# Patient Record
Sex: Female | Born: 1955
Health system: Southern US, Community
[De-identification: ages and names within clinical notes are randomized; demographics above are authoritative.]

## PROBLEM LIST (undated history)

## (undated) DIAGNOSIS — I712 Thoracic aortic aneurysm, without rupture, unspecified: Secondary | ICD-10-CM

## (undated) DIAGNOSIS — E039 Hypothyroidism, unspecified: Secondary | ICD-10-CM

## (undated) DIAGNOSIS — I350 Nonrheumatic aortic (valve) stenosis: Secondary | ICD-10-CM

## (undated) DIAGNOSIS — F329 Major depressive disorder, single episode, unspecified: Secondary | ICD-10-CM

## (undated) DIAGNOSIS — F419 Anxiety disorder, unspecified: Secondary | ICD-10-CM

## (undated) DIAGNOSIS — E079 Disorder of thyroid, unspecified: Secondary | ICD-10-CM

## (undated) DIAGNOSIS — F32A Depression, unspecified: Secondary | ICD-10-CM

## (undated) DIAGNOSIS — I10 Essential (primary) hypertension: Secondary | ICD-10-CM

## (undated) DIAGNOSIS — F99 Mental disorder, not otherwise specified: Secondary | ICD-10-CM

## (undated) DIAGNOSIS — J45909 Unspecified asthma, uncomplicated: Secondary | ICD-10-CM

## (undated) DIAGNOSIS — M109 Gout, unspecified: Secondary | ICD-10-CM

## (undated) HISTORY — DX: Nonrheumatic aortic (valve) stenosis: I35.0

## (undated) HISTORY — PX: ABDOMINAL HYSTERECTOMY: SHX81

## (undated) HISTORY — PX: APPENDECTOMY: SHX54

## (undated) HISTORY — DX: Thoracic aortic aneurysm, without rupture, unspecified: I71.20

---

## 1999-02-16 ENCOUNTER — Other Ambulatory Visit: Admission: RE | Admit: 1999-02-16 | Discharge: 1999-02-16 | Payer: Self-pay | Admitting: Gynecology

## 1999-05-02 ENCOUNTER — Ambulatory Visit (HOSPITAL_COMMUNITY): Admission: RE | Admit: 1999-05-02 | Discharge: 1999-05-02 | Payer: Self-pay | Admitting: Obstetrics and Gynecology

## 1999-10-18 ENCOUNTER — Inpatient Hospital Stay (HOSPITAL_COMMUNITY): Admission: RE | Admit: 1999-10-18 | Discharge: 1999-10-20 | Payer: Self-pay | Admitting: Obstetrics and Gynecology

## 2001-11-25 ENCOUNTER — Other Ambulatory Visit: Admission: RE | Admit: 2001-11-25 | Discharge: 2001-11-25 | Payer: Self-pay | Admitting: Obstetrics and Gynecology

## 2003-11-29 ENCOUNTER — Emergency Department (HOSPITAL_COMMUNITY): Admission: EM | Admit: 2003-11-29 | Discharge: 2003-11-29 | Payer: Self-pay | Admitting: Emergency Medicine

## 2010-09-16 ENCOUNTER — Encounter: Payer: Self-pay | Admitting: Family Medicine

## 2010-12-19 ENCOUNTER — Emergency Department (HOSPITAL_COMMUNITY)
Admission: EM | Admit: 2010-12-19 | Discharge: 2010-12-20 | Disposition: A | Discharge status: Other Acute Inpatient Hospital | Payer: Self-pay | Attending: Emergency Medicine | Admitting: Emergency Medicine

## 2010-12-19 ENCOUNTER — Emergency Department (HOSPITAL_COMMUNITY): Payer: Self-pay

## 2010-12-19 DIAGNOSIS — S0003XA Contusion of scalp, initial encounter: Secondary | ICD-10-CM | POA: Insufficient documentation

## 2010-12-19 DIAGNOSIS — F1010 Alcohol abuse, uncomplicated: Secondary | ICD-10-CM | POA: Insufficient documentation

## 2010-12-19 DIAGNOSIS — T50902A Poisoning by unspecified drugs, medicaments and biological substances, intentional self-harm, initial encounter: Secondary | ICD-10-CM | POA: Insufficient documentation

## 2010-12-19 DIAGNOSIS — S0083XA Contusion of other part of head, initial encounter: Secondary | ICD-10-CM | POA: Insufficient documentation

## 2010-12-19 DIAGNOSIS — R45851 Suicidal ideations: Secondary | ICD-10-CM | POA: Insufficient documentation

## 2010-12-19 DIAGNOSIS — W19XXXA Unspecified fall, initial encounter: Secondary | ICD-10-CM | POA: Insufficient documentation

## 2010-12-19 DIAGNOSIS — T50901A Poisoning by unspecified drugs, medicaments and biological substances, accidental (unintentional), initial encounter: Secondary | ICD-10-CM | POA: Insufficient documentation

## 2010-12-19 LAB — DIFFERENTIAL
Basophils Absolute: 0 10*3/uL (ref 0.0–0.1)
Lymphocytes Relative: 46 % (ref 12–46)
Lymphs Abs: 2.7 10*3/uL (ref 0.7–4.0)
Monocytes Absolute: 0.4 10*3/uL (ref 0.1–1.0)
Monocytes Relative: 7 % (ref 3–12)
Neutro Abs: 2.6 10*3/uL (ref 1.7–7.7)

## 2010-12-19 LAB — RAPID URINE DRUG SCREEN, HOSP PERFORMED: Cocaine: NOT DETECTED

## 2010-12-19 LAB — CBC
HCT: 43.9 % (ref 36.0–46.0)
Hemoglobin: 15.1 g/dL — ABNORMAL HIGH (ref 12.0–15.0)
MCH: 31.3 pg (ref 26.0–34.0)
MCHC: 34.4 g/dL (ref 30.0–36.0)
MCV: 90.9 fL (ref 78.0–100.0)

## 2010-12-19 LAB — COMPREHENSIVE METABOLIC PANEL
AST: 45 U/L — ABNORMAL HIGH (ref 0–37)
CO2: 24 mEq/L (ref 19–32)
Chloride: 108 mEq/L (ref 96–112)
Creatinine, Ser: 0.65 mg/dL (ref 0.4–1.2)
GFR calc Af Amer: >60 mL/min (ref >60)
GFR calc non Af Amer: >60 mL/min (ref >60)
Total Bilirubin: 0.6 mg/dL (ref 0.3–1.2)

## 2010-12-20 ENCOUNTER — Encounter (HOSPITAL_COMMUNITY): Payer: Self-pay

## 2010-12-20 ENCOUNTER — Inpatient Hospital Stay (HOSPITAL_COMMUNITY)
Admission: EM | Admit: 2010-12-20 | Discharge: 2010-12-25 | DRG: 885 | Disposition: A | Discharge status: Home / Self Care | Payer: Self-pay | Source: Intra-hospital | Attending: Psychiatry | Admitting: Psychiatry

## 2010-12-20 DIAGNOSIS — F411 Generalized anxiety disorder: Secondary | ICD-10-CM

## 2010-12-20 DIAGNOSIS — T424X4A Poisoning by benzodiazepines, undetermined, initial encounter: Secondary | ICD-10-CM

## 2010-12-20 DIAGNOSIS — F17200 Nicotine dependence, unspecified, uncomplicated: Secondary | ICD-10-CM

## 2010-12-20 DIAGNOSIS — T510X4A Toxic effect of ethanol, undetermined, initial encounter: Secondary | ICD-10-CM

## 2010-12-20 DIAGNOSIS — Z7982 Long term (current) use of aspirin: Secondary | ICD-10-CM

## 2010-12-20 DIAGNOSIS — T43502A Poisoning by unspecified antipsychotics and neuroleptics, intentional self-harm, initial encounter: Secondary | ICD-10-CM

## 2010-12-20 DIAGNOSIS — T438X2A Poisoning by other psychotropic drugs, intentional self-harm, initial encounter: Secondary | ICD-10-CM

## 2010-12-20 DIAGNOSIS — I10 Essential (primary) hypertension: Secondary | ICD-10-CM

## 2010-12-20 DIAGNOSIS — T6592XA Toxic effect of unspecified substance, intentional self-harm, initial encounter: Secondary | ICD-10-CM

## 2010-12-20 DIAGNOSIS — E039 Hypothyroidism, unspecified: Secondary | ICD-10-CM

## 2010-12-20 DIAGNOSIS — F332 Major depressive disorder, recurrent severe without psychotic features: Principal | ICD-10-CM

## 2010-12-20 LAB — SALICYLATE LEVEL: Salicylate Lvl: <4 mg/dL (ref 2.8–20.0)

## 2010-12-20 LAB — ACETAMINOPHEN LEVEL: Acetaminophen (Tylenol), Serum: <10 ug/mL — ABNORMAL LOW (ref 10–30)

## 2010-12-21 DIAGNOSIS — F1010 Alcohol abuse, uncomplicated: Secondary | ICD-10-CM

## 2010-12-21 NOTE — Consult Note (Signed)
  Anita, [name]:  [id number]  MEDICAL RECORD NO.:  [id number]           PATIENT TYPE:  E  LOCATION:  WLED                         FACILITY:  [facility]  PHYSICIAN:  Eulogio Ditch, MD DATE OF BIRTH:  1956-02-02  DATE OF CONSULTATION:  12/20/2010 DATE OF DISCHARGE:                                CONSULTATION   REASON FOR CONSULTATION:  Overdose.  HISTORY OF PRESENT ILLNESS:  This 55 year old white female with history of depression was brought to the ED after the intention overdose.  The patient overdosed on 40 to 50 Klonopin.  The patient reported stress at every level.  She has lost the job, has conflict with the daughter, has financial issues.  The patient is logical and goal directed during the interview.  Denies hearing any voices, is not delusional.  The patient is also abusing alcohol, mainly the beer.  The patient is currently not on any psych medication.  MEDICAL ISSUE:  History of hypothyroidism.  ALLERGIES:  No known drug allergies.  PHYSICAL EXAMINATION:  Done in the ER, within normal limits. VITAL SIGNS:  Blood pressure 127/80, pulse 84, respiratory rate 19.  LABORATORY DATA:  WBC 5.9, hemoglobin 15.1, alcohol level 161.  Sodium 142, potassium 4.0, BUN 11, creatinine 0.65, AST 45, ALT 55.  UDS positive for benzo.  Acetaminophen level less than 10.  Salicylate less than 4.  CT head, no acute intracranial abnormality.  Physical examination within normal limits, done by Dr. Valma Cava.  MENTAL STATUS EXAMINATION:  The patient is calm, cooperative during the interview.  Fair eye contact.  No abnormal movements present.  Hygiene, grooming fair.  Speech normal in rate, rhythm and volume.  Mood depressed, affect mood congruent.  Thought process logical and goal directed.  Thought content, recent suicide attempt by overdose, still has suicidal ideations.  Thought perception, no audiovisual hallucination reported, not internally  preoccupied.  Cognition; alert, awake, oriented x3.  Memory; immediate, recent, remote fair.  Attention and concentration poor.  Abstraction ability fair.  Insight and judgment poor.  DIAGNOSIS:  AXIS I:  Chronic alcohol abuse.  Depressive disorder, not otherwise specified, rule out major depressive disorder. AXIS II:  Deferred. AXIS III:  Hypothyroidism. AXIS IV:  Psychosocial stressors. AXIS V:  30 to 40.  RECOMMENDATIONS: 1. The patient will be admitted to [facility] for further     stabilization. 2. Librium 50 mg p.o. at bedtime started. 3. Rest of the treatment I will defer to the admitting doctor on the     unit.     Eulogio Ditch, MD     SA/MEDQ  D:  12/20/2010  T:  12/20/2010  Job:  [id number]  Electronically Signed by Eulogio Ditch  on 12/21/2010 06:50:04 AM

## 2010-12-21 NOTE — H&P (Signed)
NAME:  Anita Russell, COLLET NO.:  [id number]  MEDICAL RECORD NO.:  [id number]           PATIENT TYPE:  I  LOCATION:  0505                          [facility]ACILITY:  [facility]  PHYSICIAN:  Franchot Gallo, MD     DATE OF BIRTH:  1956-05-13  DATE OF ADMISSION:  12/20/2010 DATE OF DISCHARGE:                      PSYCHIATRIC ADMISSION ASSESSMENT   CHIEF COMPLAINT:  "I am depressed and tried to kill myself."  HISTORY OF PRESENT ILLNESS:  Anita Russell is a 55 year old single white female who presents to [facility] after overdosing on a combination of Klonopin and alcohol.  The patient states that she has been under a great deal of stress for many years with everything "coming to a head Wednesday."  She reports that she went to work and had an altercation with her supervisor and began to "yell and curse at her." The patient states that her supervisor sent her home and at that point she overdosed on what she thought was Zoloft and alcohol.  However, it appears the patient took a combination of Klonopin and beer.  The patient states that she is having significant difficulty initiating and maintaining sleep as well as decreased appetite, crying spells, feelings of sadness, anhedonia and moderate-to-severe depression.  She denies any past suicide attempts or gestures and is unclear if she was attempting to kill herself at the time of this overdose.  The patient states that in addition to work-related stresses, she has stress related to finances as well as a recent conflict with her daughter.  The patient denies any past or current manic or hypomanic symptoms nor does she report any auditory or visual infections or delusional thinking.  She states that she does drink alcohol occasionally but does not have an issue with over using alcohol or with illicit drugs.  She presents for evaluation and treatment of the above symptoms.  PAST PSYCHIATRIC HISTORY:  The patient denies any  past psychiatric hospitalizations.  She does report being on "4 to 5" different antidepressants since her daughter was 12 years of age, which would have been 14 years ago.  She reports, however, that no medication helped her depression with the exception of Zoloft.  She also reports being on Xanax for a time and Klonopin for anxiety.  PAST MEDICAL HISTORY:  CURRENT MEDICATIONS: 1. Klonopin 1 mg p.o. b.i.d. 2. Metoprolol 50 mg p.o. b.i.d. 3. Levothyroxine 100 mcg p.o. q.a.m. 4. Enteric-coated aspirin 325 mg p.o. every other day. 5. Albuterol inhaler 1/2 puff q.4 h. as needed for breathing     difficulty. 6. Multivitamin p.o. q.a.m.  ALLERGIES:  NKDA.  MEDICAL ILLNESSES: 1. Hypothyroidism. 2. Hypertension. 3. Some breathing difficulties.  PAST OPERATIONS: 1. Hysterectomy. 2. Appendectomy.  FAMILY HISTORY:  The patient reports that her biological mother had a history of depression and also took the medication Zoloft.  She denies any other family history of psychiatric or substance abuse related illnesses.  SOCIAL HISTORY:  The patient states that she was born in [place] and has lived in [place] for 23 years.  She is single and has never been married and has 1 daughter 67 years of age.  She  currently lives alone.  The patient reports smoking 1 pack of cigarettes per day and has done so for many years.  She reports occasionally using alcohol but does not feel she has an issue with this.  She denies any use of illicit drugs.  MENTAL STATUS EXAM:  General:  The patient was alert and oriented x3. She initially was somewhat guarded but became more cooperative throughout the evaluation.  Speech was appropriate in rate and volume but appeared moderate to severely depressed.  Affect was tearful.  The patient denied any auditory or visual hallucinations or delusional thinking.  The patient denied any current suicidal ideations.  She denied any homicidal ideations.   Judgment and insight both appeared fair.  IMPRESSION:  AXIS I: 1. 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 2. Generalized anxiety disorder - currently under fair to poor     control. 3. Nicotine dependence. AXIS II:  Deferred. AXIS III: 1. Hypertension. 2. Hypothyroidism. 3. Some difficulty breathing. 4. Status post hysterectomy. 5. Status post appendectomy. AXIS IV: 1. Financial constraints. 2. Recent conflicts with daughter. 3. Work-related stresses. 4. Chronic mental illness. AXIS V:  Global assessment of functioning at the time of admission approximately 35.  Highest global assessment of functioning in the past year approximately 55.  PLAN: 1. The patient was restarted on the medication Zoloft at 50 mg p.o.     q.a.m. to address her depressive and anxiety symptoms.  This     medication was started since the patient states that it has helped     more than any other antidepressive in the past. 2. The patient was restarted on the medication Klonopin at 0.5 mg p.o.     q.a.m. and 2 p.m. and 1 mg at q.h.s. 3. The patient was continued on her metoprolol at 50 mg p.o. b.i.d. 4. The patient was restarted on her levothyroxine at 100 mcg p.o. q.a.m. 5. The patient was restarted on her medication enteric-coated aspirin     at 325 mg p.o. every other day. 6. The patient was continued on her medication albuterol inhaler at     1/2 puff q.4 h. as needed for breathing difficulty. 7. The patient was continued on her multivitamin p.o. q.a.m. 8. A TSH, T3 uptake and T4 were reordered to assess her current     thyroid status. 9. The patient will continue to be monitored for dangerousness to self     and/or others. 10.The patient will participate in unit activities per routine.    ___________________________________ Franchot Gallo, MD     RR/MEDQ  D:  12/21/2010  T:  12/21/2010  Job:  [id number]  Electronically Signed by Franchot Gallo MD on 12/21/2010 04:13:11 PM

## 2010-12-22 LAB — T4, FREE: Free T4: 1.07 ng/dL (ref 0.80–1.80)

## 2010-12-22 LAB — TSH: TSH: 0.951 u[IU]/mL (ref 0.350–4.500)

## 2010-12-25 DIAGNOSIS — F339 Major depressive disorder, recurrent, unspecified: Secondary | ICD-10-CM

## 2010-12-25 DIAGNOSIS — F411 Generalized anxiety disorder: Secondary | ICD-10-CM

## 2010-12-31 NOTE — Discharge Summary (Signed)
NAMEJAMI, Anita [name]:  [id number]  MEDICAL RECORD NO.:  [id number]           PATIENT TYPE:  I  LOCATION:  0505                          [facility]ACILITY:  [facility]  PHYSICIAN:  Franchot Gallo, MD     DATE OF BIRTH:  February 13, 1956  DATE OF ADMISSION:  12/20/2010 DATE OF DISCHARGE:  12/25/2010                              DISCHARGE SUMMARY   REASON FOR ADMISSION:  The patient was depressed and tried to kill herself.  This was a 55 year old single female that presented after overdosing on a combination of Klonopin and alcohol.  She was under a lot of stress for many years with everything coming to a head on Wednesday, had an altercation with her supervisor, began to yell and curse.  She was sent home and at this point she overdosed on what she thought was Zoloft and alcohol.  FINAL IMPRESSION:  Axis I:  Major depressive disorder, recurrent, generalized anxiety disorder. Axis II:  Deferred. Axis III:  Hypertension, hypothyroidism, status post hysterectomy and appendectomy. Axis IV:  Financial constraints, recent conflict with daughter, work related stressors, chronic mental illness.  Axis V:  Global Assessment of Functioning 55.  SIGNIFICANT LABORATORY DATA:  Her alcohol level was 161.  Glucose mildly elevated at 104.  Urine drug screen positive for benzodiazepines. Acetaminophen level less than 10.  SIGNIFICANT FINDINGS:  This was an alert and oriented female, somewhat guarded, but became more cooperative throughout the evaluation.  Her speech was appropriate.  Her affect was tearful.  She was feeling depressed.  She denied any psychotic symptoms.  Her judgment and insight were fair.  She was admitted to the adult milieu in the mood disorder group.  We restarted the patient on her medication Zoloft at 50 mg to address her depressive and anxiety symptoms.  The patient reported that this was more helpful than any other antidepressant that she has had before.  Also  restarted on her medication Klonopin at 0.5 b.i.d. and 1 mg nightly.  We continued her metoprolol, levothyroxine, her aspirin, inhaler and obtained a T3 and T4.  Her sleep had improved.  Her appetite was good.  Her depression was rated as moderate.  She was denying any suicidal or homicidal thoughts, reporting her anxiety was under better control and overall feeling better.  She was participating in group.  She was very happy with her progress, denied any auditory hallucinations.  She discussed her stressors openly with her daughter.  She reported that she knew that she needed medicine for some time, but was having some financial concerns. She started to have goals for herself about possibly getting a different job and working towards a better relationship with her daughter.  On the day of discharge, the patient's sleep was good, her appetite was good. She was having mild depressive symptoms.  Denied any suicidal or homicidal thoughts with the patient stating that "she will never try that again."  She denies any auditory hallucinations.  Her anxiety was in fair to good control.  She was requesting discharge and we felt the patient was stable for discharge.  DISCHARGE MEDICATIONS: 1. Zoloft 50 mg 1 daily. 2. Albuterol  inhaler as needed. 3. Aspirin enteric-coated daily. 4. Levothyroxine 100 mg daily. 5. Metoprolol 50 mg 1 b.i.d. 6. Multivitamin 1 daily. 7. Klonopin 0.5 mg taking 1 pill b.i.d. and 2 at bedtime.  FOLLOW-UP APPOINTMENT:  [facility] on Dec 28, 2010, at 10:30, phone was [phone].     Landry Corporal, N.P.   ______________________________ Franchot Gallo, MD    JO/MEDQ  D:  12/25/2010  T:  12/26/2010  Job:  [id number]  Electronically Signed by Limmie PatriciaP. on 12/31/2010 09:40:25 AM Electronically Signed by Franchot Gallo MD on 12/31/2010 04:46:24 PM

## 2011-04-09 ENCOUNTER — Emergency Department (HOSPITAL_COMMUNITY)
Admission: EM | Admit: 2011-04-09 | Discharge: 2011-04-10 | Disposition: A | Discharge status: Home / Self Care | Payer: Self-pay | Attending: Emergency Medicine | Admitting: Emergency Medicine

## 2011-04-09 DIAGNOSIS — M109 Gout, unspecified: Secondary | ICD-10-CM | POA: Insufficient documentation

## 2011-04-09 DIAGNOSIS — E0590 Thyrotoxicosis, unspecified without thyrotoxic crisis or storm: Secondary | ICD-10-CM | POA: Insufficient documentation

## 2011-04-09 DIAGNOSIS — M25569 Pain in unspecified knee: Secondary | ICD-10-CM | POA: Insufficient documentation

## 2011-04-28 ENCOUNTER — Emergency Department (HOSPITAL_COMMUNITY)
Admission: EM | Admit: 2011-04-28 | Discharge: 2011-04-28 | Disposition: A | Discharge status: Home / Self Care | Payer: Self-pay | Attending: Emergency Medicine | Admitting: Emergency Medicine

## 2011-04-28 DIAGNOSIS — E039 Hypothyroidism, unspecified: Secondary | ICD-10-CM | POA: Insufficient documentation

## 2011-04-28 DIAGNOSIS — I10 Essential (primary) hypertension: Secondary | ICD-10-CM | POA: Insufficient documentation

## 2011-04-28 DIAGNOSIS — M79609 Pain in unspecified limb: Secondary | ICD-10-CM | POA: Insufficient documentation

## 2011-04-28 DIAGNOSIS — M109 Gout, unspecified: Secondary | ICD-10-CM | POA: Insufficient documentation

## 2012-03-16 ENCOUNTER — Encounter (HOSPITAL_BASED_OUTPATIENT_CLINIC_OR_DEPARTMENT_OTHER): Payer: Self-pay | Admitting: *Deleted

## 2012-03-16 ENCOUNTER — Emergency Department (HOSPITAL_BASED_OUTPATIENT_CLINIC_OR_DEPARTMENT_OTHER)
Admission: EM | Admit: 2012-03-16 | Discharge: 2012-03-17 | Disposition: A | Discharge status: Home / Self Care | Payer: Self-pay | Attending: Emergency Medicine | Admitting: Emergency Medicine

## 2012-03-16 DIAGNOSIS — E079 Disorder of thyroid, unspecified: Secondary | ICD-10-CM | POA: Insufficient documentation

## 2012-03-16 DIAGNOSIS — I10 Essential (primary) hypertension: Secondary | ICD-10-CM | POA: Insufficient documentation

## 2012-03-16 DIAGNOSIS — Z90710 Acquired absence of both cervix and uterus: Secondary | ICD-10-CM | POA: Insufficient documentation

## 2012-03-16 DIAGNOSIS — M109 Gout, unspecified: Secondary | ICD-10-CM | POA: Insufficient documentation

## 2012-03-16 DIAGNOSIS — F17200 Nicotine dependence, unspecified, uncomplicated: Secondary | ICD-10-CM | POA: Insufficient documentation

## 2012-03-16 HISTORY — DX: Disorder of thyroid, unspecified: E07.9

## 2012-03-16 HISTORY — DX: Essential (primary) hypertension: I10

## 2012-03-16 HISTORY — DX: Gout, unspecified: M10.9

## 2012-03-16 MED ORDER — PREDNISONE 50 MG PO TABS
60.0000 mg | ORAL_TABLET | Freq: Once | ORAL | Status: AC
  Administered 2012-03-16: 60 mg via ORAL
  Filled 2012-03-16: qty 1

## 2012-03-16 MED ORDER — PREDNISONE 50 MG PO TABS: 50.0000 mg | ORAL_TABLET | Freq: Every day | ORAL | Status: DC

## 2012-03-16 MED ORDER — INDOMETHACIN 50 MG PO CAPS: 50.0000 mg | ORAL_CAPSULE | Freq: Three times a day (TID) | ORAL | Status: DC

## 2012-03-16 MED ORDER — OXYCODONE-ACETAMINOPHEN 5-325 MG PO TABS
1.0000 | ORAL_TABLET | Freq: Once | ORAL | Status: AC
  Administered 2012-03-16: 1 via ORAL
  Filled 2012-03-16: qty 1

## 2012-03-16 MED ORDER — INDOMETHACIN 25 MG PO CAPS
50.0000 mg | ORAL_CAPSULE | Freq: Once | ORAL | Status: AC
  Administered 2012-03-16: 50 mg via ORAL
  Filled 2012-03-16: qty 2

## 2012-03-16 MED ORDER — OXYCODONE-ACETAMINOPHEN 5-325 MG PO TABS: 1.0000 | ORAL_TABLET | ORAL | Status: AC | PRN

## 2012-03-16 NOTE — ED Provider Notes (Signed)
History   This chart was scribed for Anita Booze, MD by Anita Russell. The patient was seen in room MH03/MH03. Patient's care was started at 2259.     CSN: [id number]  Arrival date & time 03/16/12  2259   First MD Initiated Contact with Patient 03/16/12 2343      Chief Complaint  Patient presents with  . Foot Pain    (Consider location/radiation/quality/duration/timing/severity/associated sxs/prior treatment) Patient is a 56 y.o. female presenting with lower extremity pain. The history is provided by the patient. No language interpreter was used.  Foot Pain This is a new problem. The current episode started yesterday. The problem occurs constantly. The problem has not changed since onset.She has tried a cold compress for the symptoms. The treatment provided no relief.   Anita Russell is a 56 y.o. female who presents to the Emergency Department complaining of moderate to severe, constant right great toe pain onset 1 day ago. Patient has applied ice to her foot with no relief. Patient states that the cold just irritated it further. Patient denies any allergies to medication. Patient with medical h/o of gout. Patient also with h/o HTN, thyroid disease, appendectomy and abdominal hysterectomy. Patient is a current everyday smoker (1 pack/day). Patient was transported to the ED by EMS, but states that her neighbor will driver her home from the ED.   PCP - [facility], Anita Russell, [place]  Past Medical History  Diagnosis Date  . Gout   . Hypertension   . Thyroid disease     Past Surgical History  Procedure Date  . Appendectomy   . Abdominal hysterectomy     No family history on file.  History  Substance Use Topics  . Smoking status: Current Everyday Smoker -- 1.0 packs/day  . Smokeless tobacco: Not on file  . Alcohol Use: Yes    OB History    Grav Para Term Preterm Abortions TAB SAB Ect Mult Living                  Review of Systems  Musculoskeletal:   Positive for pain and swelling of right great toe.  All other systems reviewed and are negative.    Allergies  Review of patient's allergies indicates no known allergies.  Home Medications   Current Outpatient Rx  Name Route Sig Dispense Refill  . ALBUTEROL SULFATE HFA 108 (90 BASE) MCG/ACT IN AERS Inhalation Inhale 2 puffs into the lungs every 6 (six) hours as needed. For shortness of breath    . CLONAZEPAM 1 MG PO TABS Oral Take 1 mg by mouth 2 (two) times daily as needed. For anxiety    . LEVOTHYROXINE SODIUM 100 MCG PO TABS Oral Take 100 mcg by mouth daily.    Marland Kitchen METOPROLOL TARTRATE 50 MG PO TABS Oral Take 50 mg by mouth 2 (two) times daily.    . SERTRALINE HCL 50 MG PO TABS Oral Take 50 mg by mouth daily.      BP 170/100  Pulse 75  Temp 97.7 F (36.5 C) (Oral)  Resp 20  SpO2 100%  Physical Exam  Nursing note and vitals reviewed. Constitutional: She is oriented to person, place, and time. She appears well-developed and well-nourished.       Appears uncomfortable.  HENT:  Head: Normocephalic and atraumatic.  Eyes: Conjunctivae and EOM are normal. Pupils are equal, round, and reactive to light.  Neck: Normal range of motion. Neck supple.  Cardiovascular: Normal rate and regular rhythm.  Pulmonary/Chest: Effort normal and breath sounds normal.  Abdominal: Soft. Bowel sounds are normal.  Musculoskeletal: Normal range of motion.       Erythema, warmth and swelling to right first MTP joint.   Neurological: She is alert and oriented to person, place, and time.  Skin: Skin is warm and dry.  Psychiatric: She has a normal mood and affect.    ED Course  Procedures (including critical care time) DIAGNOSTIC STUDIES: Oxygen Saturation is 100% on room air, normal by my interpretation.    COORDINATION OF CARE: 11:43pm- Patient informed of current plan for treatment and evaluation and agrees with plan at this time. Will administer Percocet, Indocin and Prednisone tablets in the  ED. Will discharge prescriptions for the same. Recommended that patient get Percocet prescription filled as soon as possible.    1. Podagra       MDM  Acute attack of gout. Prior records are reviewed and she has a prior ED visit for gout. She will be treated with prednisone, indomethacin, and Percocet.      I personally performed the services described in this documentation, which was scribed in my presence. The recorded information has been reviewed and considered.      Anita Booze, MD 03/17/12 [phone]

## 2012-03-16 NOTE — ED Notes (Signed)
Right foot pain since yesterday. Hx of gout. To ED via EMS.

## 2012-03-16 NOTE — ED Notes (Signed)
Pt right great toe inflamed,red and painful to touch

## 2012-06-12 ENCOUNTER — Encounter (HOSPITAL_COMMUNITY): Payer: Self-pay | Admitting: Emergency Medicine

## 2012-06-12 ENCOUNTER — Emergency Department (HOSPITAL_COMMUNITY)
Admission: EM | Admit: 2012-06-12 | Discharge: 2012-06-13 | Disposition: A | Discharge status: Other Acute Inpatient Hospital | Payer: Self-pay | Attending: Emergency Medicine | Admitting: Emergency Medicine

## 2012-06-12 DIAGNOSIS — E860 Dehydration: Secondary | ICD-10-CM | POA: Insufficient documentation

## 2012-06-12 DIAGNOSIS — F341 Dysthymic disorder: Secondary | ICD-10-CM | POA: Insufficient documentation

## 2012-06-12 DIAGNOSIS — Z79899 Other long term (current) drug therapy: Secondary | ICD-10-CM | POA: Insufficient documentation

## 2012-06-12 DIAGNOSIS — Z9089 Acquired absence of other organs: Secondary | ICD-10-CM | POA: Insufficient documentation

## 2012-06-12 DIAGNOSIS — F10929 Alcohol use, unspecified with intoxication, unspecified: Secondary | ICD-10-CM

## 2012-06-12 DIAGNOSIS — F329 Major depressive disorder, single episode, unspecified: Secondary | ICD-10-CM

## 2012-06-12 DIAGNOSIS — E079 Disorder of thyroid, unspecified: Secondary | ICD-10-CM | POA: Insufficient documentation

## 2012-06-12 DIAGNOSIS — I10 Essential (primary) hypertension: Secondary | ICD-10-CM | POA: Insufficient documentation

## 2012-06-12 DIAGNOSIS — F1020 Alcohol dependence, uncomplicated: Secondary | ICD-10-CM | POA: Insufficient documentation

## 2012-06-12 LAB — POCT I-STAT, CHEM 8
Chloride: 105 mEq/L (ref 96–112)
HCT: 49 % — ABNORMAL HIGH (ref 36.0–46.0)
Potassium: 4 mEq/L (ref 3.5–5.1)
Sodium: 143 mEq/L (ref 135–145)

## 2012-06-12 LAB — RAPID URINE DRUG SCREEN, HOSP PERFORMED: Barbiturates: NOT DETECTED

## 2012-06-12 LAB — URINE MICROSCOPIC-ADD ON

## 2012-06-12 LAB — CBC WITH DIFFERENTIAL/PLATELET
Basophils Absolute: 0 10*3/uL (ref 0.0–0.1)
Basophils Relative: 0 % (ref 0–1)
HCT: 46.3 % — ABNORMAL HIGH (ref 36.0–46.0)
MCHC: 35.9 g/dL (ref 30.0–36.0)
Monocytes Relative: 4 % (ref 3–12)
Neutro Abs: 2.8 10*3/uL (ref 1.7–7.7)
Neutrophils Relative %: 48 % (ref 43–77)
Platelets: 190 10*3/uL (ref 150–400)
RBC: 5.21 MIL/uL — ABNORMAL HIGH (ref 3.87–5.11)
RDW: 12.4 % (ref 11.5–15.5)
WBC: 5.8 10*3/uL (ref 4.0–10.5)

## 2012-06-12 LAB — HEPATIC FUNCTION PANEL
ALT: 18 U/L (ref 0–35)
AST: 23 U/L (ref 0–37)
Albumin: 3.4 g/dL — ABNORMAL LOW (ref 3.5–5.2)
Total Protein: 7 g/dL (ref 6.0–8.3)

## 2012-06-12 LAB — ETHANOL: Alcohol, Ethyl (B): 263 mg/dL — ABNORMAL HIGH (ref 0–11)

## 2012-06-12 LAB — URINALYSIS, ROUTINE W REFLEX MICROSCOPIC
Bilirubin Urine: NEGATIVE
Ketones, ur: NEGATIVE mg/dL
Nitrite: NEGATIVE
Protein, ur: 30 mg/dL — AB
Specific Gravity, Urine: 1.024 (ref 1.005–1.030)
Urobilinogen, UA: 0.2 mg/dL (ref 0.0–1.0)

## 2012-06-12 LAB — LIPASE, BLOOD: Lipase: 18 U/L (ref 11–59)

## 2012-06-12 MED ORDER — IBUPROFEN 200 MG PO TABS: 600.0000 mg | ORAL_TABLET | Freq: Three times a day (TID) | ORAL | Status: DC | PRN

## 2012-06-12 MED ORDER — ONDANSETRON HCL 8 MG PO TABS
4.0000 mg | ORAL_TABLET | Freq: Three times a day (TID) | ORAL | Status: DC | PRN
  Administered 2012-06-13 (×2): 4 mg via ORAL
  Filled 2012-06-12 (×2): qty 1

## 2012-06-12 MED ORDER — VITAMIN B-1 100 MG PO TABS
100.0000 mg | ORAL_TABLET | Freq: Every day | ORAL | Status: DC
  Administered 2012-06-13: 100 mg via ORAL
  Filled 2012-06-12 (×2): qty 1

## 2012-06-12 MED ORDER — ALUM & MAG HYDROXIDE-SIMETH 200-200-20 MG/5ML PO SUSP
30.0000 mL | ORAL | Status: DC | PRN
  Administered 2012-06-13: 30 mL via ORAL
  Filled 2012-06-12: qty 30

## 2012-06-12 MED ORDER — ALBUTEROL SULFATE HFA 108 (90 BASE) MCG/ACT IN AERS: 2.0000 | INHALATION_SPRAY | Freq: Four times a day (QID) | RESPIRATORY_TRACT | Status: DC | PRN

## 2012-06-12 MED ORDER — LORAZEPAM 2 MG/ML IJ SOLN
1.0000 mg | Freq: Four times a day (QID) | INTRAMUSCULAR | Status: DC | PRN
  Administered 2012-06-12 – 2012-06-13 (×2): 1 mg via INTRAVENOUS
  Filled 2012-06-12 (×2): qty 1

## 2012-06-12 MED ORDER — FOLIC ACID 1 MG PO TABS
1.0000 mg | ORAL_TABLET | Freq: Every day | ORAL | Status: DC
  Administered 2012-06-12 – 2012-06-13 (×2): 1 mg via ORAL
  Filled 2012-06-12 (×2): qty 1

## 2012-06-12 MED ORDER — SODIUM CHLORIDE 0.9 % IV SOLN
1000.0000 mL | Freq: Once | INTRAVENOUS | Status: AC
  Administered 2012-06-12: 1000 mL via INTRAVENOUS

## 2012-06-12 MED ORDER — ADULT MULTIVITAMIN W/MINERALS CH
1.0000 | ORAL_TABLET | Freq: Every day | ORAL | Status: DC
  Administered 2012-06-12 – 2012-06-13 (×2): 1 via ORAL
  Filled 2012-06-12 (×2): qty 1

## 2012-06-12 MED ORDER — ZOLPIDEM TARTRATE 5 MG PO TABS
5.0000 mg | ORAL_TABLET | Freq: Every evening | ORAL | Status: DC | PRN
  Administered 2012-06-12: 5 mg via ORAL
  Filled 2012-06-12: qty 1

## 2012-06-12 MED ORDER — NICOTINE 21 MG/24HR TD PT24
21.0000 mg | MEDICATED_PATCH | Freq: Every day | TRANSDERMAL | Status: DC
  Administered 2012-06-12 – 2012-06-13 (×2): 21 mg via TRANSDERMAL
  Filled 2012-06-12 (×2): qty 1

## 2012-06-12 MED ORDER — METOPROLOL TARTRATE 25 MG PO TABS
50.0000 mg | ORAL_TABLET | Freq: Two times a day (BID) | ORAL | Status: DC
  Administered 2012-06-12 – 2012-06-13 (×2): 50 mg via ORAL
  Filled 2012-06-12: qty 1
  Filled 2012-06-12: qty 2
  Filled 2012-06-12: qty 1
  Filled 2012-06-12: qty 2

## 2012-06-12 MED ORDER — SODIUM CHLORIDE 0.9 % IV SOLN
1000.0000 mL | INTRAVENOUS | Status: DC
  Administered 2012-06-12: 1000 mL via INTRAVENOUS

## 2012-06-12 MED ORDER — LORAZEPAM 1 MG PO TABS
1.0000 mg | ORAL_TABLET | Freq: Four times a day (QID) | ORAL | Status: DC | PRN
  Administered 2012-06-13: 1 mg via ORAL
  Filled 2012-06-12: qty 1

## 2012-06-12 MED ORDER — THIAMINE HCL 100 MG/ML IJ SOLN
100.0000 mg | Freq: Every day | INTRAMUSCULAR | Status: DC
  Administered 2012-06-12: 100 mg via INTRAVENOUS
  Filled 2012-06-12: qty 2

## 2012-06-12 MED ORDER — LEVOTHYROXINE SODIUM 100 MCG PO TABS
100.0000 ug | ORAL_TABLET | Freq: Every day | ORAL | Status: DC
  Administered 2012-06-13: 100 ug via ORAL
  Filled 2012-06-12 (×4): qty 1

## 2012-06-12 MED ORDER — ONDANSETRON HCL 4 MG/2ML IJ SOLN
4.0000 mg | Freq: Once | INTRAMUSCULAR | Status: AC
  Administered 2012-06-12: 4 mg via INTRAVENOUS
  Filled 2012-06-12: qty 2

## 2012-06-12 MED ORDER — ACETAMINOPHEN 325 MG PO TABS: 650.0000 mg | ORAL_TABLET | ORAL | Status: DC | PRN

## 2012-06-12 MED ORDER — ZOLPIDEM TARTRATE 5 MG PO TABS: 10.0000 mg | ORAL_TABLET | Freq: Every evening | ORAL | Status: DC | PRN

## 2012-06-12 NOTE — ED Provider Notes (Cosign Needed)
History  This chart was scribed for Ward Givens, MD by Ladona Ridgel Day. This patient was seen in room TR10C/TR10C and the patient's care was started at 1213.   CSN: [id number]  Arrival date & time 06/12/12  1213   First MD Initiated Contact with Patient 06/12/12 1554      Chief Complaint  Patient presents with  . Alcohol Problem  . Anxiety   The history is provided by the patient. No language interpreter was used.   Anita Russell is a 56 y.o. female who presents to the Emergency Department complaining of being on a drinking binge since 05/02/2012 where she was drinking about a pint a day, but drank a fifth yesterday. She states increased her issues of losing job, house, and her pets (had her cat and dog put to sleep because of their age and not having a home for them) caused her to begin binge drinking. She has drank beer daily for years but reports increased alcohol use the past year.  She states some SI with not plan but no HI. She has never been admitted for detox or therapy and has never been to AA. She states nausea, diarrhea, light headedness, but no emesis.   Pt lost her job in July, just had her house foreclosed.    She is followed by family practice who prescribes zoloft and clonopin. She smokes 1ppd  [facility]  Past Medical History  Diagnosis Date  . Gout   . Hypertension   . Thyroid disease     Past Surgical History  Procedure Date  . Appendectomy   . Abdominal hysterectomy     No family history on file.  History  Substance Use Topics  . Smoking status: Current Every Day Smoker -- 1.0 packs/day  . Smokeless tobacco: Not on file  . Alcohol Use: Yes  unemployed Lost her home  OB History    Grav Para Term Preterm Abortions TAB SAB Ect Mult Living                  Review of Systems  Constitutional: Negative for fever and chills.  Respiratory: Negative for shortness of breath.   Gastrointestinal: Positive for nausea and diarrhea. Negative for  vomiting and abdominal pain.  Neurological: Positive for light-headedness. Negative for tremors and weakness.  Psychiatric/Behavioral: Positive for disturbed wake/sleep cycle. Negative for suicidal ideas and self-injury.       Binge drinking for 1.5 months but increased drinking over past year. States feeling depressed.   All other systems reviewed and are negative.    Allergies  Review of patient's allergies indicates no known allergies.  Home Medications   Current Outpatient Rx  Name Route Sig Dispense Refill  . ALBUTEROL SULFATE HFA 108 (90 BASE) MCG/ACT IN AERS Inhalation Inhale 2 puffs into the lungs every 6 (six) hours as needed. For shortness of breath    . CLONAZEPAM 1 MG PO TABS Oral Take 1 mg by mouth 2 (two) times daily as needed. For anxiety    . INDOMETHACIN 50 MG PO CAPS Oral Take 1 capsule (50 mg total) by mouth 3 (three) times daily with meals. 30 capsule 0  . LEVOTHYROXINE SODIUM 100 MCG PO TABS Oral Take 100 mcg by mouth daily.    Marland Kitchen METOPROLOL TARTRATE 50 MG PO TABS Oral Take 50 mg by mouth 2 (two) times daily.    Marland Kitchen PREDNISONE 50 MG PO TABS Oral Take 1 tablet (50 mg total) by mouth daily.  5 tablet 0  . SERTRALINE HCL 50 MG PO TABS Oral Take 50 mg by mouth daily.      Triage Vitals: BP 137/103  Pulse 57  Temp 97.6 F (36.4 C) (Oral)  Resp 22  SpO2 97%  Vital signs normal except bradycardia   Physical Exam  Nursing note and vitals reviewed. Constitutional: She is oriented to person, place, and time. She appears well-developed and well-nourished.  Non-toxic appearance. She does not appear ill. No distress.  HENT:  Head: Normocephalic and atraumatic.  Right Ear: External ear normal.  Left Ear: External ear normal.  Nose: Nose normal. No mucosal edema or rhinorrhea.  Mouth/Throat: Mucous membranes are normal. No dental abscesses or uvula swelling.       Dry mucous membranes  Eyes: Conjunctivae normal and EOM are normal. Pupils are equal, round, and reactive to  light.  Neck: Normal range of motion and full passive range of motion without pain. Neck supple.  Cardiovascular: Normal rate, regular rhythm and normal heart sounds.  Exam reveals no gallop and no friction rub.   No murmur heard. Pulmonary/Chest: Effort normal and breath sounds normal. No respiratory distress. She has no wheezes. She has no rhonchi. She has no rales. She exhibits no tenderness and no crepitus.  Abdominal: Soft. Normal appearance and bowel sounds are normal. She exhibits no distension. There is no tenderness. There is no rebound and no guarding.  Musculoskeletal: Normal range of motion. She exhibits no edema and no tenderness.       Moves all extremities well.   Neurological: She is alert and oriented to person, place, and time. She has normal strength. No cranial nerve deficit.       No tremor  Skin: Skin is warm, dry and intact. No rash noted. No erythema. No pallor.  Psychiatric: Her speech is normal and behavior is normal. Her mood appears not anxious.       Depressed affect    ED Course  Procedures (including critical care time)  Medications  ondansetron (ZOFRAN) injection 4 mg (not administered)  0.9 %  sodium chloride infusion (not administered)    Followed by  0.9 %  sodium chloride infusion (not administered)    Followed by  0.9 %  sodium chloride infusion (not administered)  .DIAGNOSTIC STUDIES: Oxygen Saturation is 97% on room air, normal by my interpretation.    COORDINATION OF CARE: At 430 PM Discussed treatment plan with patient which includes nausea medicine, IV fluids, blood work, ETOH, UD. Patient agrees.   Patient given IV fluids for her dehydration.  Recheck after first liter and IV zofran, and is feeling better. Still has dry tongue, will give second liter.   18:42 Magda Paganini, ACT will see patient.   Results for orders placed during the hospital encounter of 06/12/12  URINALYSIS, ROUTINE W REFLEX MICROSCOPIC      Component Value Range   Color,  Urine YELLOW  YELLOW   APPearance CLEAR  CLEAR   Specific Gravity, Urine 1.024  1.005 - 1.030   pH 5.0  5.0 - 8.0   Glucose, UA NEGATIVE  NEGATIVE mg/dL   Hgb urine dipstick NEGATIVE  NEGATIVE   Bilirubin Urine NEGATIVE  NEGATIVE   Ketones, ur NEGATIVE  NEGATIVE mg/dL   Protein, ur 30 (*) NEGATIVE mg/dL   Urobilinogen, UA 0.2  0.0 - 1.0 mg/dL   Nitrite NEGATIVE  NEGATIVE   Leukocytes, UA NEGATIVE  NEGATIVE  URINE RAPID DRUG SCREEN (HOSP PERFORMED)  Component Value Range   Opiates NONE DETECTED  NONE DETECTED   Cocaine NONE DETECTED  NONE DETECTED   Benzodiazepines NONE DETECTED  NONE DETECTED   Amphetamines NONE DETECTED  NONE DETECTED   Tetrahydrocannabinol NONE DETECTED  NONE DETECTED   Barbiturates NONE DETECTED  NONE DETECTED  ETHANOL      Component Value Range   Alcohol, Ethyl (B) 263 (*) 0 - 11 mg/dL  CBC WITH DIFFERENTIAL      Component Value Range   WBC 5.8  4.0 - 10.5 K/uL   RBC 5.21 (*) 3.87 - 5.11 MIL/uL   Hemoglobin 16.6 (*) 12.0 - 15.0 g/dL   HCT 16.1 (*) 09.6 - 04.5 %   MCV 88.9  78.0 - 100.0 fL   MCH 31.9  26.0 - 34.0 pg   MCHC 35.9  30.0 - 36.0 g/dL   RDW 40.9  81.1 - 91.4 %   Platelets 190  150 - 400 K/uL   Neutrophils Relative 48  43 - 77 %   Neutro Abs 2.8  1.7 - 7.7 K/uL   Lymphocytes Relative 45  12 - 46 %   Lymphs Abs 2.6  0.7 - 4.0 K/uL   Monocytes Relative 4  3 - 12 %   Monocytes Absolute 0.3  0.1 - 1.0 K/uL   Eosinophils Relative 2  0 - 5 %   Eosinophils Absolute 0.1  0.0 - 0.7 K/uL   Basophils Relative 0  0 - 1 %   Basophils Absolute 0.0  0.0 - 0.1 K/uL  POCT I-STAT, CHEM 8      Component Value Range   Sodium 143  135 - 145 mEq/L   Potassium 4.0  3.5 - 5.1 mEq/L   Chloride 105  96 - 112 mEq/L   BUN 17  6 - 23 mg/dL   Creatinine, Ser 7.82 (*) 0.50 - 1.10 mg/dL   Glucose, Bld 956 (*) 70 - 99 mg/dL   Calcium, Ion 2.13 (*) 1.12 - 1.23 mmol/L   TCO2 24  0 - 100 mmol/L   Hemoglobin 16.7 (*) 12.0 - 15.0 g/dL   HCT 08.6 (*) 57.8 - 46.9 %    HEPATIC FUNCTION PANEL      Component Value Range   Total Protein 7.0  6.0 - 8.3 g/dL   Albumin 3.4 (*) 3.5 - 5.2 g/dL   AST 23  0 - 37 U/L   ALT 18  0 - 35 U/L   Alkaline Phosphatase 53  39 - 117 U/L   Total Bilirubin 0.4  0.3 - 1.2 mg/dL   Bilirubin, Direct <6.2  0.0 - 0.3 mg/dL   Indirect Bilirubin NOT CALCULATED  0.3 - 0.9 mg/dL  MAGNESIUM      Component Value Range   Magnesium 1.9  1.5 - 2.5 mg/dL  LIPASE, BLOOD      Component Value Range   Lipase 18  11 - 59 U/L  URINE MICROSCOPIC-ADD ON      Component Value Range   Squamous Epithelial / LPF RARE  RARE   WBC, UA 0-2  <3 WBC/hpf   RBC / HPF 0-2  <3 RBC/hpf   Bacteria, UA RARE  RARE   Laboratory interpretation all normal except concentrated hemoglobin consistent with dehydration, renal insufficiency consistent with dehydration    1. Alcoholism /alcohol abuse   2. Alcohol intoxication   3. Depression   4. Dehydration     Plan psychiatric placement.   MDM  I personally performed the services  described in this documentation, which was scribed in my presence. The recorded information has been reviewed and considered.  Devoria Albe, MD, Armando Gang          Ward Givens, MD 06/12/12 2002

## 2012-06-12 NOTE — ED Notes (Signed)
Patient states she is here for detox from ETOH. She states she has been drinking bad since 06-01-12. She states she drinks 2-4 40's and a pint of hard liquor in a day.

## 2012-06-12 NOTE — ED Notes (Signed)
Patient requesting her night-time dose of blood pressure medication (Metoprolol); EDP notified.

## 2012-06-12 NOTE — ED Notes (Signed)
Pt alert and states she is anxious and nauseous. Pt not due for nausea medication until 0100. Pt not in distress. Pt expresses thoughts of harming self. Charge nurse notified. Will continue to monitor.

## 2012-06-12 NOTE — ED Notes (Signed)
Drinking a lot since 05/02/12. Last drink was vodka (5th). Family crisis.

## 2012-06-12 NOTE — BH Assessment (Signed)
Assessment Note   Anita Russell is an 56 y.o. female who presents voluntarily with request for alcohol detox. Pt sts she has been drinking nonstop for past 11 days. She says she has been drinking one fifth and a few 40 oz beers daily. Last use was one pint 06/06/12. She reports fleeting SI with no plan. She endorses depressed mood, loss of pleasure, isolating, insomnia, poor appetite, fatigue and guilt. She endorses moderate anxiety. Only inpt hospitalization was 11/2010 at [facility] after a suicide attempt by alcohol and pills. She also reports approx 3 weeks ago she tried to cut wrists w/ razor blade and has scars. Pt was laid off job Jan 2013 and subsequently had to short sell house. She had to put her animals to sleep recently due to lack of money and lack of stable housing. She is currently sleeping on a friend's couch. Her affect is depressed and appropriate to circumstance. She is polite and cooperative. No hx of seizures. This is first request for detox. Current withdrawal symptoms are nausea, diarrhea and anxiety. She sts she was dx several yrs ago w/ depression and anxiety. No delusions noted. Pt endorses VH by stating she often sees shadows moving at corner of her eyes and there is nothing there. No HI.   Axis I: Major Depressive D/O, Recurrent, Severe w/ Psychotic Features  Anxiety D/O NOS            Alcohol Abuse Axis II: Deferred Axis III:  Past Medical History  Diagnosis Date  . Gout   . Hypertension   . Thyroid disease    Axis IV: economic problems, housing problems, occupational problems, other psychosocial or environmental problems, problems related to social environment and problems with primary support group Axis V: 31-40 impairment in reality testing  Past Medical History:  Past Medical History  Diagnosis Date  . Gout   . Hypertension   . Thyroid disease     Past Surgical History  Procedure Date  . Appendectomy   . Abdominal hysterectomy     Family History:  History reviewed. No pertinent family history.  Social History:  reports that she has been smoking.  She does not have any smokeless tobacco history on file. She reports that she drinks alcohol. She reports that she does not use illicit drugs.  Additional Social History:  Alcohol / Drug Use Pain Medications: n/a Prescriptions: see PTA meds Over the Counter: n/a History of alcohol / drug use?: Yes Longest period of sobriety (when/how long): months Withdrawal Symptoms: Nausea / Vomiting;Diarrhea (anxiety) Substance #1 Name of Substance 1: alcohol 1 - Age of First Use: 18 1 - Amount (size/oz): one fifth or three 40 oz  1 - Frequency: varies 1 - Duration: has been drinking nonstop for 11 days 1 - Last Use / Amount: 06/12/12 - 1 pint  CIWA: CIWA-Ar BP: 137/103 mmHg Pulse Rate: 57  Nausea and Vomiting: 3 Tactile Disturbances: none Tremor: not visible, but can be felt fingertip to fingertip Auditory Disturbances: not present Paroxysmal Sweats: no sweat visible Visual Disturbances: very mild sensitivity Anxiety: two Headache, Fullness in Head: none present Agitation: normal activity Orientation and Clouding of Sensorium: oriented and can do serial additions CIWA-Ar Total: 7  COWS:    Allergies: No Known Allergies  Home Medications:  (Not in a hospital admission)  OB/GYN Status:  No LMP recorded. Patient is postmenopausal.  General Assessment Data Location of Assessment: [facility] ED Living Arrangements: Non-relatives/Friends Can pt return to current living arrangement?: Yes Admission  Status: Voluntary Is patient capable of signing voluntary admission?: Yes Transfer from: Home Referral Source: Self/Family/Friend  Education Status Is patient currently in school?: No Current Grade: na Highest grade of school patient has completed: 12  Risk to self Suicidal Ideation: Yes-Currently Present Suicidal Intent: No Is patient at risk for suicide?: Yes Suicidal Plan?: No Access to  Means: No What has been your use of drugs/alcohol within the last 12 months?: drinking for past 11 days Previous Attempts/Gestures: Yes How many times?: 2  (4/12 OD w/ alcohol,pills & 3 wks ago cut wrists w/ razor bla) Other Self Harm Risks: cutting Triggers for Past Attempts: Unpredictable Intentional Self Injurious Behavior: Cutting Comment - Self Injurious Behavior: cut herself in last 3/4 weeks Family Suicide History: Yes (half brother) Recent stressful life event(s): Loss (Comment);Job Loss;Financial Problems Persecutory voices/beliefs?: No Depression: Yes Depression Symptoms: Isolating;Fatigue;Guilt;Tearfulness;Loss of interest in usual pleasures;Insomnia Substance abuse history and/or treatment for substance abuse?: Yes Suicide prevention information given to non-admitted patients: Not applicable  Risk to Others Homicidal Ideation: No Thoughts of Harm to Others: No Current Homicidal Intent: No Current Homicidal Plan: No Access to Homicidal Means: No Identified Victim: na History of harm to others?: No Assessment of Violence: None Noted Violent Behavior Description: pt denies hx of violence Does patient have access to weapons?: Yes (Comment) (razor blades, knife) Criminal Charges Pending?: No Does patient have a court date: No  Psychosis Hallucinations: Auditory (shadows moving in corner of eyes) Delusions: None noted  Mental Status Report Appear/Hygiene: Disheveled Eye Contact: Good Motor Activity: Freedom of movement Speech: Logical/coherent Level of Consciousness: Alert Mood: Depressed;Anhedonia;Sad Affect: Appropriate to circumstance;Sad;Depressed Anxiety Level: Severe Thought Processes: Coherent;Relevant Judgement: Unimpaired Orientation: Person;Place;Time;Situation Obsessive Compulsive Thoughts/Behaviors: None  Cognitive Functioning Concentration: Decreased Memory: Remote Intact;Recent Intact IQ: Average Insight: Good Impulse Control: Fair Appetite:  Poor Weight Loss: 0  Weight Gain: 0  Sleep: No Change Total Hours of Sleep: 3  Vegetative Symptoms: None  ADLScreening [facility]) Patient's cognitive ability adequate to safely complete daily activities?: Yes Patient able to express need for assistance with ADLs?: Yes Independently performs ADLs?: Yes (appropriate for developmental age)  Abuse/Neglect [facility]) Physical Abuse: Denies Verbal Abuse: Denies Sexual Abuse: Denies  Prior Inpatient Therapy Prior Inpatient Therapy: Yes Prior Therapy Dates: April 2012 Prior Therapy Facilty/Provider(s): [facility] Reason for Treatment: suicide attempt  Prior Outpatient Therapy Prior Outpatient Therapy: Yes Prior Therapy Dates: in recent past Prior Therapy Facilty/Provider(s): [facility] Reason for Treatment: depression  ADL Screening (condition at time of admission) Patient's cognitive ability adequate to safely complete daily activities?: Yes Patient able to express need for assistance with ADLs?: Yes Independently performs ADLs?: Yes (appropriate for developmental age) Weakness of Legs: None Weakness of Arms/Hands: None  Home Assistive Devices/Equipment Home Assistive Devices/Equipment: Eyeglasses    Abuse/Neglect Assessment (Assessment to be complete while patient is alone) Physical Abuse: Denies Verbal Abuse: Denies Sexual Abuse: Denies Exploitation of patient/patient's resources: Denies Self-Neglect: Denies Values / Beliefs Cultural Requests During Hospitalization: None Spiritual Requests During Hospitalization: None   Advance Directives (For Healthcare) Advance Directive: Patient does not have advance directive;Patient would not like information    Additional Information 1:1 In Past 12 Months?: No CIRT Risk: No Elopement Risk: No Does patient have medical clearance?: Yes     Disposition:  Disposition Disposition of Patient: Inpatient treatment program (detox, depression) Type of inpatient treatment  program: Adult  On Site Evaluation by:   Reviewed with Physician:     Donnamarie Rossetti P 06/12/2012 11:51 PM

## 2012-06-12 NOTE — ED Notes (Signed)
Patient currently sitting up in bed; no respiratory or acute distress noted.  Patient given another pillow; no other questions or concerns at this time; will continue to monitor.

## 2012-06-12 NOTE — ED Notes (Signed)
Pt has urine specimen cup and knows to give sample. Says she cannot go at this time

## 2012-06-13 ENCOUNTER — Encounter (HOSPITAL_COMMUNITY): Payer: Self-pay | Admitting: *Deleted

## 2012-06-13 ENCOUNTER — Inpatient Hospital Stay (HOSPITAL_COMMUNITY)
Admission: RE | Admit: 2012-06-13 | Discharge: 2012-06-19 | DRG: 885 | Disposition: A | Discharge status: Home / Self Care | Payer: Federal, State, Local not specified - Other | Source: Ambulatory Visit | Attending: Psychiatry | Admitting: Psychiatry

## 2012-06-13 DIAGNOSIS — Z23 Encounter for immunization: Secondary | ICD-10-CM

## 2012-06-13 DIAGNOSIS — M109 Gout, unspecified: Secondary | ICD-10-CM | POA: Diagnosis present

## 2012-06-13 DIAGNOSIS — F419 Anxiety disorder, unspecified: Secondary | ICD-10-CM

## 2012-06-13 DIAGNOSIS — E039 Hypothyroidism, unspecified: Secondary | ICD-10-CM | POA: Diagnosis present

## 2012-06-13 DIAGNOSIS — F1010 Alcohol abuse, uncomplicated: Secondary | ICD-10-CM | POA: Diagnosis present

## 2012-06-13 DIAGNOSIS — F411 Generalized anxiety disorder: Secondary | ICD-10-CM | POA: Diagnosis present

## 2012-06-13 DIAGNOSIS — I10 Essential (primary) hypertension: Secondary | ICD-10-CM | POA: Diagnosis present

## 2012-06-13 DIAGNOSIS — R45851 Suicidal ideations: Secondary | ICD-10-CM

## 2012-06-13 DIAGNOSIS — J45909 Unspecified asthma, uncomplicated: Secondary | ICD-10-CM | POA: Diagnosis present

## 2012-06-13 DIAGNOSIS — F332 Major depressive disorder, recurrent severe without psychotic features: Principal | ICD-10-CM | POA: Diagnosis present

## 2012-06-13 HISTORY — DX: Major depressive disorder, single episode, unspecified: F32.9

## 2012-06-13 HISTORY — DX: Depression, unspecified: F32.A

## 2012-06-13 HISTORY — DX: Mental disorder, not otherwise specified: F99

## 2012-06-13 HISTORY — DX: Unspecified asthma, uncomplicated: J45.909

## 2012-06-13 MED ORDER — LOPERAMIDE HCL 2 MG PO CAPS
4.0000 mg | ORAL_CAPSULE | Freq: Once | ORAL | Status: AC
  Administered 2012-06-13: 4 mg via ORAL
  Filled 2012-06-13: qty 2

## 2012-06-13 MED ORDER — MAGNESIUM HYDROXIDE 400 MG/5ML PO SUSP: 30.0000 mL | Freq: Every day | ORAL | Status: DC | PRN

## 2012-06-13 MED ORDER — VITAMIN B-1 100 MG PO TABS
100.0000 mg | ORAL_TABLET | Freq: Every day | ORAL | Status: DC
  Administered 2012-06-14 – 2012-06-19 (×6): 100 mg via ORAL
  Filled 2012-06-13 (×9): qty 1

## 2012-06-13 MED ORDER — ONDANSETRON 4 MG PO TBDP: 4.0000 mg | ORAL_TABLET | Freq: Four times a day (QID) | ORAL | Status: AC | PRN

## 2012-06-13 MED ORDER — INFLUENZA VIRUS VACC SPLIT PF IM SUSP: 0.5000 mL | INTRAMUSCULAR | Status: AC

## 2012-06-13 MED ORDER — LOPERAMIDE HCL 2 MG PO CAPS: 2.0000 mg | ORAL_CAPSULE | ORAL | Status: AC | PRN

## 2012-06-13 MED ORDER — PNEUMOCOCCAL VAC POLYVALENT 25 MCG/0.5ML IJ INJ: 0.5000 mL | INJECTION | INTRAMUSCULAR | Status: AC

## 2012-06-13 MED ORDER — ADULT MULTIVITAMIN W/MINERALS CH
1.0000 | ORAL_TABLET | Freq: Every day | ORAL | Status: DC
  Administered 2012-06-14 – 2012-06-19 (×6): 1 via ORAL
  Filled 2012-06-13 (×9): qty 1

## 2012-06-13 MED ORDER — ACETAMINOPHEN 325 MG PO TABS: 650.0000 mg | ORAL_TABLET | Freq: Four times a day (QID) | ORAL | Status: DC | PRN

## 2012-06-13 MED ORDER — ALUM & MAG HYDROXIDE-SIMETH 200-200-20 MG/5ML PO SUSP: 30.0000 mL | ORAL | Status: DC | PRN

## 2012-06-13 MED ORDER — HYDROXYZINE HCL 25 MG PO TABS
25.0000 mg | ORAL_TABLET | Freq: Four times a day (QID) | ORAL | Status: AC | PRN
  Administered 2012-06-13: 25 mg via ORAL

## 2012-06-13 MED ORDER — CHLORDIAZEPOXIDE HCL 25 MG PO CAPS
25.0000 mg | ORAL_CAPSULE | Freq: Four times a day (QID) | ORAL | Status: AC | PRN
  Administered 2012-06-13 – 2012-06-14 (×2): 25 mg via ORAL
  Filled 2012-06-13 (×2): qty 1

## 2012-06-13 MED ORDER — THIAMINE HCL 100 MG/ML IJ SOLN
100.0000 mg | Freq: Once | INTRAMUSCULAR | Status: AC
  Administered 2012-06-13: 100 mg via INTRAMUSCULAR

## 2012-06-13 NOTE — ED Notes (Signed)
Patient currently resting quietly in bed watching television; no respiratory or acute distress noted.  Patient given ice water, per request.  Sitter present at bedside; sitter and patient denies any needs at this time.  Will continue to monitor.

## 2012-06-13 NOTE — ED Provider Notes (Addendum)
Pt alert, content, nad. Vitals stable. Awaiting [facility] bed.   Suzi Roots, MD 06/13/12 [phone]  Pt also notes diarrhea stools x 2-3 weeks, several x per day. Denies ill contacts. No abd pain. No vomiting. Imodium po.  Stool studies added to labs.   Suzi Roots, MD 06/13/12 1213  Act team indicates pt accepted to [facility], Dr Dub Mikes, bed ready  Suzi Roots, MD 06/13/12 1536

## 2012-06-13 NOTE — Progress Notes (Signed)
Patient ID: Anita Russell, female   DOB: 31-Oct-1955, 56 y.o.   MRN: [id number]  Problem: Anxiety, Alcohol Abuse  D: Patient anxious, with moderate tremors and mild nausea. Pt with passive SI but contracts for safety.  A: Monitor patient Q 15 minutes for safety, assess patient using CIWA scale, administer medications as ordered by MD, encourage group participation and staff/peer interaction.  R: Patient compliant with medications, and participated in group session. Patient pleasant and cooperative with staff and peers in milieu.

## 2012-06-13 NOTE — Progress Notes (Signed)
Patient did attend the evening speaker AA meeting.  

## 2012-06-13 NOTE — ED Notes (Signed)
Patient ambulated to restroom with no difficulty; assisted by sitter.  Patient denies any needs at this time; will continue to monitor.

## 2012-06-13 NOTE — ED Notes (Signed)
Denton Lank, MD notified of pt's BP trends

## 2012-06-13 NOTE — Progress Notes (Signed)
Pt admitted on voluntary basis, pt on admission reports increased depression and anxiety and also reports increased alcohol use the past couple weeks and cites financial stressors as the primary cause. Pt stated that she recently lost her house, lost her job over the summer and is currently sleeping on her friends couch. Pt states that she has taken zoloft in the past but has not taken recently as she is unable to afford the medication. Pt has hypertension and ashtma and also thryoid problems. Pt also stated that she did go to [facility] but stopped going as she said that she did not like it there but willing to give it another chance. Pt does endorse some passive suicidal thoughts but able to contract for safety on the unit. Pt does go to [facility] but has not been able to go recently due to financial constraints. Pt was oriented to the unit and safety maintained

## 2012-06-14 DIAGNOSIS — F332 Major depressive disorder, recurrent severe without psychotic features: Principal | ICD-10-CM

## 2012-06-14 DIAGNOSIS — I10 Essential (primary) hypertension: Secondary | ICD-10-CM | POA: Diagnosis present

## 2012-06-14 DIAGNOSIS — E039 Hypothyroidism, unspecified: Secondary | ICD-10-CM | POA: Diagnosis present

## 2012-06-14 DIAGNOSIS — F1010 Alcohol abuse, uncomplicated: Secondary | ICD-10-CM | POA: Diagnosis present

## 2012-06-14 DIAGNOSIS — F329 Major depressive disorder, single episode, unspecified: Secondary | ICD-10-CM

## 2012-06-14 DIAGNOSIS — M109 Gout, unspecified: Secondary | ICD-10-CM | POA: Diagnosis present

## 2012-06-14 MED ORDER — METOPROLOL TARTRATE 50 MG PO TABS
50.0000 mg | ORAL_TABLET | Freq: Two times a day (BID) | ORAL | Status: DC
  Administered 2012-06-14 – 2012-06-19 (×11): 50 mg via ORAL
  Filled 2012-06-14 (×3): qty 1
  Filled 2012-06-14 (×2): qty 6
  Filled 2012-06-14 (×5): qty 1
  Filled 2012-06-14: qty 6
  Filled 2012-06-14: qty 1
  Filled 2012-06-14: qty 6
  Filled 2012-06-14 (×4): qty 1

## 2012-06-14 MED ORDER — CHLORDIAZEPOXIDE HCL 25 MG PO CAPS
25.0000 mg | ORAL_CAPSULE | Freq: Three times a day (TID) | ORAL | Status: AC
  Administered 2012-06-15 (×3): 25 mg via ORAL
  Filled 2012-06-14 (×3): qty 1

## 2012-06-14 MED ORDER — CHLORDIAZEPOXIDE HCL 25 MG PO CAPS
25.0000 mg | ORAL_CAPSULE | Freq: Four times a day (QID) | ORAL | Status: AC
  Administered 2012-06-14 (×4): 25 mg via ORAL
  Filled 2012-06-14 (×4): qty 1

## 2012-06-14 MED ORDER — CHLORDIAZEPOXIDE HCL 25 MG PO CAPS
25.0000 mg | ORAL_CAPSULE | Freq: Every day | ORAL | Status: AC
  Administered 2012-06-17: 25 mg via ORAL
  Filled 2012-06-14: qty 1

## 2012-06-14 MED ORDER — LORATADINE 10 MG PO TABS
10.0000 mg | ORAL_TABLET | Freq: Every day | ORAL | Status: DC
  Administered 2012-06-14 – 2012-06-19 (×6): 10 mg via ORAL
  Filled 2012-06-14 (×11): qty 1

## 2012-06-14 MED ORDER — CHLORDIAZEPOXIDE HCL 25 MG PO CAPS
25.0000 mg | ORAL_CAPSULE | ORAL | Status: AC
  Administered 2012-06-16 (×2): 25 mg via ORAL
  Filled 2012-06-14 (×2): qty 1

## 2012-06-14 MED ORDER — SERTRALINE HCL 50 MG PO TABS
50.0000 mg | ORAL_TABLET | Freq: Every day | ORAL | Status: DC
  Administered 2012-06-14 – 2012-06-19 (×6): 50 mg via ORAL
  Filled 2012-06-14 (×2): qty 1
  Filled 2012-06-14: qty 3
  Filled 2012-06-14 (×2): qty 1
  Filled 2012-06-14: qty 3
  Filled 2012-06-14 (×3): qty 1

## 2012-06-14 MED ORDER — LEVOTHYROXINE SODIUM 100 MCG PO TABS
100.0000 ug | ORAL_TABLET | Freq: Every day | ORAL | Status: DC
  Administered 2012-06-14 – 2012-06-19 (×6): 100 ug via ORAL
  Filled 2012-06-14 (×2): qty 3
  Filled 2012-06-14 (×7): qty 1

## 2012-06-14 MED ORDER — ALBUTEROL SULFATE HFA 108 (90 BASE) MCG/ACT IN AERS: 2.0000 | INHALATION_SPRAY | Freq: Four times a day (QID) | RESPIRATORY_TRACT | Status: DC | PRN

## 2012-06-14 NOTE — Discharge Planning (Signed)
Pt was seen this morning during discharge planning group. Pt stated that she did not sleep well.  Pt denied SI/HI/AVH.  Pt refused to answer any other questions.

## 2012-06-14 NOTE — Progress Notes (Signed)
Psychoeducational Group Note  Date:  06/14/2012 Time: 1515 Group Topic/Focus:  Making Healthy Choices:   The focus of this group is to help patients identify negative/unhealthy choices they were using prior to admission and identify positive/healthier coping strategies to replace them upon discharge.  Participation Level:  Active  Participation Quality:  Attentive  Affect:  Appropriate  Cognitive:  Appropriate  Insight:  Good  Engagement in Group:  Good  Additional Comments:  Patient expressed she will try to be more opened.  Anita Russell 06/14/2012, 6:36 PM

## 2012-06-14 NOTE — Progress Notes (Signed)
[facility] Group Notes:  (Counselor/Nursing/MHT/Case Management/Adjunct)  06/14/2012 4:10 PM  Type of Therapy:  Group Therapy  Participation Level:  None  Participation Quality:  Resistant  Affect:  Irritable  Cognitive:  Oriented  Insight:  None  Engagement in Group:  None  Engagement in Therapy:  None  Modes of Intervention:  None  Summary of Progress/Problems: Pt refused to speak during group.   Estevan Ryder Renee 06/14/2012, 4:10 PM

## 2012-06-14 NOTE — Progress Notes (Signed)
Patient ID: Anita Russell, female   DOB: 11-04-1955, 56 y.o.   MRN: [id number] 06-14-12 @ 1844: nursing shift note: d: pt had a negative c diff test. A: order obtained to cnc the isolation precautions.R: order discontinued. r

## 2012-06-14 NOTE — [facility] Suicide Risk Assessment (Signed)
Suicide Risk Assessment  Admission Assessment     Nursing information obtained from:  Patient Demographic factors:  Caucasian;Low socioeconomic status;Unemployed Current Mental Status:  Self-harm thoughts Loss Factors:  Decrease in vocational status;Financial problems / change in socioeconomic status Historical Factors:  Family history of mental illness or substance abuse Risk Reduction Factors:  Positive social support  CLINICAL FACTORS:   Severe Anxiety and/or Agitation Depression:   Anhedonia Comorbid alcohol abuse/dependence Hopelessness Impulsivity Insomnia Recent sense of peace/wellbeing Severe Alcohol/Substance Abuse/Dependencies Unstable or Poor Therapeutic Relationship  COGNITIVE FEATURES THAT CONTRIBUTE TO RISK:  Closed-mindedness Loss of executive function Polarized thinking    SUICIDE RISK:   Minimal: No identifiable suicidal ideation.  Patients presenting with no risk factors but with morbid ruminations; may be classified as minimal risk based on the severity of the depressive symptoms  PLAN OF CARE: Admitted for detox treatment and medication management for depression.   Anita Russell,Anita Russell. 06/14/2012, 10:16 AM

## 2012-06-14 NOTE — Progress Notes (Signed)
Patient ID: Anita Russell, female   DOB: 03/12/1956, 56 y.o.   MRN: [id number]  Problem: ETOH Abuse, Depression  D: Patient pleasant and cooperative with staff/peers. Pt endorses depression.  A: Monitor Q 15 minutes for safety, encourage continued staff/peer interaction, administer medications as ordered by MD.  R: Patient compliant with medications, attended group session. Pt verbally contracts for safety.

## 2012-06-14 NOTE — Progress Notes (Signed)
Psychoeducational Group Note  Date:  06/14/2012 Time:  0915  Group Topic/Focus:  Goals Group:   The focus of this group is to help patients establish daily goals to achieve during treatment and discuss how the patient can incorporate goal setting into their daily lives to aide in recovery.  Participation Level:  Did Not Attend  Participation Quality:    Affect:

## 2012-06-14 NOTE — H&P (Signed)
Reviewed document and agree with the treatment recommendations 

## 2012-06-14 NOTE — Progress Notes (Signed)
Patient did attend the evening speaker AA meeting.  

## 2012-06-14 NOTE — Progress Notes (Signed)
Psychoeducational Group Note  Date:  06/14/2012 Time:  1015  Group Topic/Focus:  Making Healthy Choices:   The focus of this group is to help patients identify negative/unhealthy choices they were using prior to admission and identify positive/healthier coping strategies to replace them upon discharge.  Participation Level:  None  Participation Quality:  Drowsy  Affect:  Blunted and Depressed  Cognitive:  Appropriate  Insight:  None  Engagement in Group:  None  Additional Comments:    Acelynn Dejonge Shari Prows 06/14/2012, 12:17 PM

## 2012-06-14 NOTE — Progress Notes (Signed)
Patient ID: Anita Russell, female   DOB: 17-Apr-1956, 56 y.o.   MRN: [id number] 06-14-12 @ 1435 D: pt seems to having a difficult detox from etoh with a ciwa at 8. A: rn advised her about librium prn as well as her scheduled librium and advised her to be careful not to fall. Charted her as as moderate fall risk. She stated.she understood about the available librium prn and stated she would be careful with her gait. rn will monitor and q 15 min cks continue.
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
## 2012-06-15 DIAGNOSIS — F411 Generalized anxiety disorder: Secondary | ICD-10-CM

## 2012-06-15 DIAGNOSIS — F1010 Alcohol abuse, uncomplicated: Secondary | ICD-10-CM

## 2012-06-15 DIAGNOSIS — F329 Major depressive disorder, single episode, unspecified: Secondary | ICD-10-CM

## 2012-06-15 MED ORDER — TRAZODONE HCL 50 MG PO TABS
50.0000 mg | ORAL_TABLET | Freq: Every evening | ORAL | Status: DC | PRN
  Administered 2012-06-15 – 2012-06-18 (×4): 50 mg via ORAL
  Filled 2012-06-15: qty 6
  Filled 2012-06-15 (×2): qty 1
  Filled 2012-06-15: qty 6
  Filled 2012-06-15 (×2): qty 1
  Filled 2012-06-15: qty 6
  Filled 2012-06-15 (×3): qty 1
  Filled 2012-06-15: qty 6
  Filled 2012-06-15 (×3): qty 1

## 2012-06-15 NOTE — Progress Notes (Signed)
Aftercare Planning Group: 06/15/2012 9:45 AM Pt did not attend d/c planning group on this date.  SW met with pt individually at this time.  Pt presents with tearful affect and anxious mood.  Pt states that she recently lost her house to foreclosure and has been staying with friends.  Pt states that she is unemployed and feels that she has nothing.  Pt states that she has been drinking heavily for the past week due to this loss.  Pt rates depression and anxiety at a 10 today.  Pt denies SI.  SW will assess for appropriate referrals.    [facility] Group Notes:  (Counselor/Nursing/MHT/Case Management/Adjunct)  06/15/2012  1:15 PM  Type of Therapy:  Group Therapy  Participation Level:  Minimal  Participation Quality:  Appropriate and Attentive  Affect:  Depressed and Flat  Cognitive:  Alert and Appropriate  Insight:  Limited  Engagement in Group:  Limited  Engagement in Therapy:  Limited  Modes of Intervention:  Support  Summary of Progress/Problems: Discussed overcoming obstacles and what this means for pt.  Pt shared that she understands what a peer is going through regarding homelessness but didn't want to share any further.  Pt states that she is doing well to be sitting in group right now.     Anita Russell, [place] 06/15/2012 12:23 PM

## 2012-06-15 NOTE — Progress Notes (Signed)
D: Patient denies SI/HI and A/V hallucinations; reports anxious earlier and able to calm down; reports that the milieu is not comfortable for her and it is hard for her to adjust to all the different personalities in the dayroom  A: Monitored q 15 minutes; patient encouraged to attend groups; patient educated about medications; patient given medications per physician orders; patient encouraged to express feelings and/or concerns  R: Patient is cooperative but very anxious at times patient's interaction with staff and peers is appropriate but minimal; ; patient is taking medications as prescribed and tolerating medications; patient attended one group after she was able to calm down

## 2012-06-15 NOTE — Progress Notes (Signed)
Interdisciplinary Treatment Plan Update (Adult)  Date: 06/15/2012  Time Reviewed: 9:42 AM  Progress in Treatment:  Attending groups: Yes  Participating in groups: Yes  Taking medication as prescribed: Yes  Tolerating medication: Yes  Family/Significant othe contact made: Counselor assessing for appropriate contact  Patient understands diagnosis: Yes  Discussing patient identified problems/goals with staff: Yes  Medical problems stabilized or resolved: Yes  Denies suicidal/homicidal ideation: Yes  Issues/concerns per patient self-inventory: None identified  Other: N/A  New problem(s) identified: None Identified  Reason for Continuation of Hospitalization:  Anxiety  Depression  Medication Management  Interventions implemented related to continuation of hospitalization: mood stabilization, medication monitoring and adjustment, group therapy and psycho education, safety checks q 15 mins  Additional comments: N/A  Estimated length of stay: 3-5 days  Discharge Plan: SW is assessing for appropriate referrals.  New goal(s): N/A  Review of initial/current patient goals per problem list:  1. Goal(s): Address substance use  Met: No  Target date: by discharge  As evidenced by: completing detox protocol and refer to appropriate treatment 2. Goal (s): Reduce depressive and anxiety symptoms  Met: No  Target date: by discharge  As evidenced by: Reducing depression from a 10 to a 3 as reported by pt.  Attendees:  Patient: Anita Russell  06/15/2012 10:13 AM   Family:    Physician: Geoffery Lyons, MD  06/15/2012 9:42 AM   Nursing: Roswell Miners, RN  06/15/2012 9:42 AM   Clinical Social Worker: Reyes Ivan, LCSWA  06/15/2012 9:42 AM   Other: Alease Frame, RN  06/15/2012 9:42 AM   Other: Nanine Means, NP  06/15/2012 9:44 AM   Other: Bubba Camp, psychology intern  06/15/2012 9:44 AM   Other: Jonni Sanger, RN  06/15/2012 9:45 AM   Other:    Scribe for Treatment Team:  Reyes Ivan  06/15/2012 9:42 AM

## 2012-06-15 NOTE — Progress Notes (Signed)
[facility] MD Progress Note  06/15/2012 12:45 PM  Diagnosis:   Axis I: Alcohol Abuse, Anxiety Disorder NOS and Depressive Disorder NOS Axis II: Deferred Axis III:  Past Medical History  Diagnosis Date  . Gout   . Hypertension   . Thyroid disease   . Mental disorder   . Depression   . Asthma    Axis IV: economic problems, housing problems, occupational problems and problems with primary support group Axis V: 51-60 moderate symptoms  ADL's:  Intact  Sleep: Fair  Appetite:  Fair  Suicidal Ideation:  Plan:  None Intent:  None Means:  None Homicidal Ideation:  Plan:  None Intent:  None Means:  None  Ms. Jessie Foot endorsed that she had a difficult time this AM. She has been dealing with all the loses. She felt triggered when her pants broke and she had to tell the group where she was going to go after here and she really had no where to go. She has been dealing with losing her house, having lost her job etc.  Mental Status Examination/Evaluation: Objective:  Appearance: Disheveled  Eye Contact::  Fair  Speech:  Clear and Coherent  Volume:  Normal  Mood:  Anxious and Depressed  Affect:  worried, anxious, feeling overwhelmed  Thought Process:  Coherent and Logical  Orientation:  Full  Thought Content:  WDL  Suicidal Thoughts:  Admits to ideas when she felt so overwhelmed, but no intent, no plans  Homicidal Thoughts:  No  Memory:  Immediate;   Fair Recent;   Fair Remote;   Fair  Judgement:  Fair  Insight:  Present  Psychomotor Activity:  Normal  Concentration:  Fair  Recall:  Fair  Akathisia:  No  Handed:  Right  AIMS (if indicated):     Assets:  Communication Skills Vocational/Educational  Sleep:  Number of Hours: 5    Vital Signs:Blood pressure 120/90, pulse 66, temperature 97.9 F (36.6 C), temperature source Oral, resp. rate 16, height 5' 7.5" (1.715 m), weight 80.287 kg (177 lb), SpO2 97.00%. Current Medications: Current Facility-Administered Medications    Medication Dose Route Frequency Provider Last Rate Last Dose  . acetaminophen (TYLENOL) tablet 650 mg  650 mg Oral Q6H PRN Verne Spurr, PA-C      . albuterol (PROVENTIL HFA;VENTOLIN HFA) 108 (90 BASE) MCG/ACT inhaler 2 puff  2 puff Inhalation Q6H PRN Mickie D. Adams, PA      . alum & mag hydroxide-simeth (MAALOX/MYLANTA) 200-200-20 MG/5ML suspension 30 mL  30 mL Oral Q4H PRN Verne Spurr, PA-C      . chlordiazePOXIDE (LIBRIUM) capsule 25 mg  25 mg Oral Q6H PRN Verne Spurr, PA-C   25 mg at 06/14/12 1308  . chlordiazePOXIDE (LIBRIUM) capsule 25 mg  25 mg Oral QID Verne Spurr, PA-C   25 mg at 06/14/12 2117   Followed by  . chlordiazePOXIDE (LIBRIUM) capsule 25 mg  25 mg Oral TID Verne Spurr, PA-C   25 mg at 06/15/12 1156   Followed by  . chlordiazePOXIDE (LIBRIUM) capsule 25 mg  25 mg Oral BH-qamhs Verne Spurr, PA-C       Followed by  . chlordiazePOXIDE (LIBRIUM) capsule 25 mg  25 mg Oral Daily Verne Spurr, PA-C      . hydrOXYzine (ATARAX/VISTARIL) tablet 25 mg  25 mg Oral Q6H PRN Verne Spurr, PA-C   25 mg at 06/13/12 2123  . influenza  inactive virus vaccine (FLUZONE/FLUARIX) injection 0.5 mL  0.5 mL Intramuscular Tomorrow-1000 Rachael Fee, MD      .  levothyroxine (SYNTHROID, LEVOTHROID) tablet 100 mcg  100 mcg Oral Daily Mickie D. Adams, PA   100 mcg at 06/15/12 9811  . loperamide (IMODIUM) capsule 2-4 mg  2-4 mg Oral PRN Verne Spurr, PA-C      . loratadine (CLARITIN) tablet 10 mg  10 mg Oral Daily Verne Spurr, PA-C   10 mg at 06/15/12 9147  . magnesium hydroxide (MILK OF MAGNESIA) suspension 30 mL  30 mL Oral Daily PRN Verne Spurr, PA-C      . metoprolol (LOPRESSOR) tablet 50 mg  50 mg Oral BID Mickie D. Adams, PA   50 mg at 06/15/12 8295  . multivitamin with minerals tablet 1 tablet  1 tablet Oral Daily Verne Spurr, PA-C   1 tablet at 06/15/12 [phone]  . ondansetron (ZOFRAN-ODT) disintegrating tablet 4 mg  4 mg Oral Q6H PRN Verne Spurr, PA-C      . pneumococcal 23  valent vaccine (PNU-IMMUNE) injection 0.5 mL  0.5 mL Intramuscular Tomorrow-1000 Rachael Fee, MD      . sertraline (ZOLOFT) tablet 50 mg  50 mg Oral Daily Mickie D. Adams, PA   50 mg at 06/15/12 0865  . thiamine (VITAMIN B-1) tablet 100 mg  100 mg Oral Daily Verne Spurr, PA-C   100 mg at 06/15/12 7846    Lab Results: No results found for this or any previous visit (from the past 48 hour(s)).  Physical Findings: AIMS: Facial and Oral Movements Muscles of Facial Expression: None, normal Lips and Perioral Area: None, normal Jaw: None, normal Tongue: None, normal,Extremity Movements Upper (arms, wrists, hands, fingers): None, normal Lower (legs, knees, ankles, toes): None, normal, Trunk Movements Neck, shoulders, hips: None, normal, Overall Severity Severity of abnormal movements (highest score from questions above): None, normal Incapacitation due to abnormal movements: None, normal Patient's awareness of abnormal movements (rate only patient's report): No Awareness, Dental Status Current problems with teeth and/or dentures?: No Does patient usually wear dentures?: No  CIWA:  CIWA-Ar Total: 0  COWS:     Treatment Plan Summary: Daily contact with patient to assess and evaluate symptoms and progress in treatment Medication management  Plan: Supportive approach/coping skills/relapse prevention  Adysen Raphael A 06/15/2012, 12:45 PM

## 2012-06-15 NOTE — Progress Notes (Signed)
Patient ID: Anita Russell, female   DOB: 1955-08-31, 56 y.o.   MRN: [id number] She has been up and to group for only a short period  Of time till she became anxious and had to leave. When called into treatment team she was  Anxious due to two many people being there. Staff member not directly involved with her left, she then came into the room but continued to stand, she did sit down after a while. She was tearful and hopeless during meeting.

## 2012-06-15 NOTE — Progress Notes (Signed)
Psychoeducational Group Note  Date:  06/15/2012 Time:  1000  Group Topic/Focus:  Self Care:   The focus of this group is to help patients understand the importance of self-care in order to improve or restore emotional, physical, spiritual, interpersonal, and financial health.  Participation Level:  Did Not Attend  Participation Quality:    Affect:    Cognitive:    Insight:    Engagement in Group:    Additional Comments:  Pt was in the room crying, couldn't attend group. Pt anxiety was high.  Anita Russell M 06/15/2012, 12:00 PM

## 2012-06-15 NOTE — Progress Notes (Signed)
Psychoeducational Group Note  Date:  06/15/2012 Time:  1100  Group Topic/Focus:  Self Care:   The focus of this group is to help patients understand the importance of self-care in order to improve or restore emotional, physical, spiritual, interpersonal, and financial health.  Participation Level:  Did Not Attend  Participation Quality:    Affect:    Cognitive:    Insight:    Engagement in Group:    Additional Comments:  Pt was in treatment team.  Gracy Racer 06/15/2012, 12:02 PM

## 2012-06-16 DIAGNOSIS — F1910 Other psychoactive substance abuse, uncomplicated: Secondary | ICD-10-CM

## 2012-06-16 DIAGNOSIS — F1994 Other psychoactive substance use, unspecified with psychoactive substance-induced mood disorder: Secondary | ICD-10-CM

## 2012-06-16 LAB — T4, FREE: Free T4: 1.06 ng/dL (ref 0.80–1.80)
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
## 2012-06-16 NOTE — Progress Notes (Signed)
Patient ID: Anita Russell, female   DOB: 01-12-1956, 56 y.o.   MRN: [id number] She has been up and  Has been to groups interacting with peers and staff. Stated that she feels better today  That her outlook is better. She denies pain, SI and withdrawal symptoms.

## 2012-06-16 NOTE — Progress Notes (Signed)
Aftercare Planning Group: 06/16/2012 9:45 AM Pt attended discharge planning group and actively participated in group.  SW provided pt with today's workbook.  Pt presents with flat affect and depressed mood.  Pt rates depression at a 3 and anxiety at a 7 today.  Pt denies SI/HI. Pt states that she can return home to her friend's couch and can follow up at [facility].  SW secured pt's follow up appointment at [facility].  After hearing treatment options pt was interested in [facility] and completed the application.  SW made this referral today.  No further needs voiced by pt at this time.     [facility] Group Notes:  (Counselor/Nursing/MHT/Case Management/Adjunct)  06/16/2012  1:15 PM  Type of Therapy:  Group Therapy  Participation Level:  Active  Participation Quality:  Appropriate and Attentive  Affect:  Appropriate  Cognitive:  Alert and Appropriate  Insight:  Good  Engagement in Group:  Good  Engagement in Therapy:  Good  Modes of Intervention:  Clarification, Education, Socialization and Support  Summary of Progress/Problems: Patient was attentive and engaged with speaker from [employer].  Patient expressed interest in their programs and services.  Patient processed ways they can relate to the speaker.      Reyes Ivan, LCSWA 06/16/2012 10:36 AM

## 2012-06-16 NOTE — Progress Notes (Signed)
[facility] MD Progress Note  06/16/2012 1:38 PM  Diagnosis:   Axis I: Major Depression, Recurrent severe, Rule out Substance dependence, Substance Abuse and Substance Induced Mood Disorder Axis II: Deferred Axis III:  Past Medical History  Diagnosis Date  . Gout   . Hypertension   . Thyroid disease   . Mental disorder   . Depression   . Asthma    Axis IV: housing problems, occupational problems and problems with primary support group Axis V: 51-60 moderate symptoms  ADL's:  Intact  Sleep: Good  Appetite:  Fair  Suicidal Ideation:  Plan:  None Intent:  None Means:  None Homicidal Ideation:  Plan:  None Intent:  None Means:  None  Worried about where to go from here. Concerned about her ability to function out of a structure place.: Mental Status Examination/Evaluation: Objective:  Appearance: Fairly Groomed  Patent attorney::  Fair  Speech:  Normal Rate  Volume:  Normal  Mood:  Anxious  Affect:  Appropriate  Thought Process:  Coherent, Intact, Linear and Logical  Orientation:  Full  Thought Content:  WDL  Suicidal Thoughts:  No  Homicidal Thoughts:  No  Memory:  Immediate;   Fair Recent;   Fair Remote;   Fair  Judgement:  Intact  Insight:  Present  Psychomotor Activity:  Normal  Concentration:  Fair  Recall:  Fair  Akathisia:  No  Handed:  Right  AIMS (if indicated):     Assets:  Communication Skills Desire for Improvement Talents/Skills  Sleep:  Number of Hours: 6.5    Vital Signs:Blood pressure 115/78, pulse 60, temperature 97.9 F (36.6 C), temperature source Oral, resp. rate 16, height 5' 7.5" (1.715 m), weight 80.287 kg (177 lb), SpO2 97.00%. Current Medications: Current Facility-Administered Medications  Medication Dose Route Frequency Provider Last Rate Last Dose  . acetaminophen (TYLENOL) tablet 650 mg  650 mg Oral Q6H PRN Verne Spurr, PA-C      . albuterol (PROVENTIL HFA;VENTOLIN HFA) 108 (90 BASE) MCG/ACT inhaler 2 puff  2 puff Inhalation Q6H PRN  Mickie D. Adams, PA      . alum & mag hydroxide-simeth (MAALOX/MYLANTA) 200-200-20 MG/5ML suspension 30 mL  30 mL Oral Q4H PRN Verne Spurr, PA-C      . chlordiazePOXIDE (LIBRIUM) capsule 25 mg  25 mg Oral Q6H PRN Verne Spurr, PA-C   25 mg at 06/14/12 1478  . chlordiazePOXIDE (LIBRIUM) capsule 25 mg  25 mg Oral TID Verne Spurr, PA-C   25 mg at 06/15/12 1648   Followed by  . chlordiazePOXIDE (LIBRIUM) capsule 25 mg  25 mg Oral BH-qamhs Verne Spurr, PA-C   25 mg at 06/16/12 0805   Followed by  . chlordiazePOXIDE (LIBRIUM) capsule 25 mg  25 mg Oral Daily Verne Spurr, PA-C      . hydrOXYzine (ATARAX/VISTARIL) tablet 25 mg  25 mg Oral Q6H PRN Verne Spurr, PA-C   25 mg at 06/13/12 2123  . levothyroxine (SYNTHROID, LEVOTHROID) tablet 100 mcg  100 mcg Oral Daily Mickie D. Adams, PA   100 mcg at 06/16/12 0806  . loperamide (IMODIUM) capsule 2-4 mg  2-4 mg Oral PRN Verne Spurr, PA-C      . loratadine (CLARITIN) tablet 10 mg  10 mg Oral Daily Verne Spurr, PA-C   10 mg at 06/16/12 0807  . magnesium hydroxide (MILK OF MAGNESIA) suspension 30 mL  30 mL Oral Daily PRN Verne Spurr, PA-C      . metoprolol (LOPRESSOR) tablet 50 mg  50  mg Oral BID Mickie D. Adams, PA   50 mg at 06/16/12 0806  . multivitamin with minerals tablet 1 tablet  1 tablet Oral Daily Verne Spurr, PA-C   1 tablet at 06/16/12 0805  . ondansetron (ZOFRAN-ODT) disintegrating tablet 4 mg  4 mg Oral Q6H PRN Verne Spurr, PA-C      . sertraline (ZOLOFT) tablet 50 mg  50 mg Oral Daily Mickie D. Adams, PA   50 mg at 06/16/12 0805  . thiamine (VITAMIN B-1) tablet 100 mg  100 mg Oral Daily Verne Spurr, PA-C   100 mg at 06/16/12 0805  . traZODone (DESYREL) tablet 50 mg  50 mg Oral QHS,MR X 1 Kerry Hough, PA   50 mg at 06/15/12 2229    Lab Results:  Results for orders placed during the hospital encounter of 06/13/12 (from the past 48 hour(s))  T4, FREE     Status: Normal   Collection Time   06/15/12  7:53 PM      Component  Value Range Comment   Free T4 1.06  0.80 - 1.80 ng/dL   TSH     Status: Abnormal   Collection Time   06/15/12  7:53 PM      Component Value Range Comment   TSH 5.422 (*) 0.350 - 4.500 uIU/mL     Physical Findings: AIMS: Facial and Oral Movements Muscles of Facial Expression: None, normal Lips and Perioral Area: None, normal Jaw: None, normal Tongue: None, normal,Extremity Movements Upper (arms, wrists, hands, fingers): None, normal Lower (legs, knees, ankles, toes): None, normal, Trunk Movements Neck, shoulders, hips: None, normal, Overall Severity Severity of abnormal movements (highest score from questions above): None, normal Incapacitation due to abnormal movements: None, normal Patient's awareness of abnormal movements (rate only patient's report): No Awareness, Dental Status Current problems with teeth and/or dentures?: No Does patient usually wear dentures?: No  CIWA:  CIWA-Ar Total: 0  COWS:     Treatment Plan Summary: Daily contact with patient to assess and evaluate symptoms and progress in treatment Medication management  Plan: Supportive approach/coping skills/relaspe prevention  Anita Russell A 06/16/2012, 1:38 PM

## 2012-06-16 NOTE — Progress Notes (Signed)
Psychoeducational Group Note  Date:  06/16/2012 Time:  11:00am  Group Topic/Focus:  Recovery Goals:   The focus of this group is to identify appropriate goals for recovery and establish a plan to achieve them.  Participation Level:  Active  Participation Quality:  Appropriate, Attentive and Sharing  Affect:  Appropriate  Cognitive:  Appropriate  Insight:  Good  Engagement in Group:  Good  Additional Comments:  Patient attended recovery goals group and participated. Patient define recovery in own terms, and then stated a situation where a plan was set for recovery. Patient worked in Occupational psychologist and completed the sheet on my personal goals for recovery. Patient encouraged to explain worksheet and state goals that would help change when in the stage of recovery.       Ardelle Park O 06/16/2012, 7:12 PM

## 2012-06-16 NOTE — Progress Notes (Signed)
Resting quietly with eyes closed. Respirations even and unlabored. No distress noted. Q 15 minute check continues to maintain safety 

## 2012-06-16 NOTE — Progress Notes (Signed)
[facility] Group Notes:  (Counselor/Nursing/MHT/Case Management/Adjunct)  06/16/2012 3:07 PM  Type of Therapy:  Psychoeducational Skills  Participation Level:  Minimal  Participation Quality:  Attentive, Redirectable and Resistant  Affect:  Irritable  Cognitive:  Alert, Appropriate and Oriented  Insight:  Limited  Engagement in Group:  Limited  Engagement in Therapy:  n/a  Modes of Intervention:  Activity, Education, Problem-solving, Socialization and Support  Summary of Progress/Problems: Anita Russell attended Psychoeducational group that focused on using quality time with support systems/individuals to engage in healthy coping skills. Anita Russell participated in activity guessing about self and peers. Anita Russell was quiet but spoke when prompted while group discussed who their support systems are, how they can spend positive quality time together as a way to strengthen the relationship and use coping skills. Anita Russell was given a homework assignment to find two ways to improve her support systems and twenty activities she can do to spend quality time with her supports.   Wandra Scot 06/16/2012, 3:07 PM

## 2012-06-17 DIAGNOSIS — F329 Major depressive disorder, single episode, unspecified: Secondary | ICD-10-CM

## 2012-06-17 DIAGNOSIS — F419 Anxiety disorder, unspecified: Secondary | ICD-10-CM | POA: Diagnosis present

## 2012-06-17 LAB — STOOL CULTURE

## 2012-06-17 NOTE — Progress Notes (Signed)
daily Aftercare Planning Group: 06/17/2012 8:45 AM  Pt attended discharge planning group and actively participated in group.  SW provided pt with today's workbook.  Pt presents with frustrated and agitated mood/affect.  Patient very fixated on others problems and outcomes opposed to her own and her own plan.  Patient reports she is very discouraged about limited resources and her plan at DC.  Patient was agreeable to a [facility] referral, however patient was declined.  Also referred to [facility] in which there are no current beds.  Patient today agreeable to be referred to [facility] and placed on the wait list with a tentative date for admission.  Discussed anxiety is very high at an 8/9 and depression is 7/8 with no SI/HI plan or intent.  Patient encouraged to express her feelings and given support about her discharge plan.  Reports she has the option to stay on her friends couch for her continued housing.  No other needs at this time voiced.  [facility] Group Notes:  (Counselor/Nursing/MHT/Case Management/Adjunct)  06/17/2012 1:15 PM  Type of Therapy:  Group Therapy  Processing Group: Emotion Regulation Topic  Participation Level:  Active  Participation Quality:  Appropriate, Attentive and Sharing  Affect:  Blunted and Flat  Cognitive:  Alert, Appropriate and Oriented  Insight:  Good  Engagement in Group:  Good  Engagement in Therapy:  Lonell Face was limited and resistant at first, but when asked to participate she was cooperative  Modes of Intervention:  Problem-solving and Support  Summary of Progress/Problems: Lyndsi was resistant and guarded as group began, however once her turn came around to engage, she did so willingly.  Abryanna was able to report what she had learned from this group session and how others problems and how they handle their emotions is different than hers.  Sherese was able to express and correlate how her emotions are controlled in difficult situations such as at work and  someone is annoying her.  Nicolette was able to explain her coping skills and also what she would do in that situation to control her own feelings of frustration by talking, rather than internalizing.   Jacen Carlini Nail, LCSW 06/17/2012 11:12 AM

## 2012-06-17 NOTE — Progress Notes (Signed)
D: Patient cooperative with staff and peers. Patient's mood is anxious. She reported on her self inventory sheet that she slept well, energy level is normal, and ability to pay attention is poor, but improving. Patient rated depression "2" and feelings of hopelessness a "1". Patient reported that changes she plan to make to better care of self are to get housing, a job, go to [employer] and therapy.   A: Support and encouragement provided to patient. Scheduled medications administered to patient per MD orders. Maintain 15 minute checks throughout the day.   R: Patient receptive. Denies SI/HI/AVH and pain. Patient remains safe.

## 2012-06-17 NOTE — Progress Notes (Signed)
Patient ID: Anita Russell, female   DOB: [birth year], 56 y.o.   MRN: [id number]  D: Pt attended evening groups, social with peers.  Affect pensive, mood depressed and sad, denies SI/HI, denies AH/VH.  Compliant with unit rules and expectations and medications.  Stated is feeling "better, less depressed since they moved all of those people out of here", feels like remaining groups provides more therapeutic milieu than previous.  Rates depression 0/10 ("compared to before they left"), anxiety 7/10, hopelessness 3/10 ("trying to focus on the positive"), pain 0/10.  Remains preoccupied with housing following decision and reports "confusion over who to ask about what".  Discussed deep sadness re: death of pets, loss of home, financial circumstances.  Denies urges to drink.  Contracting for safety.  A: Pt encouraged to attend as many groups as possible, to utilize peer and staff feedback, and to take initiative regarding post-discharge housing issues.  Pt supported in grief regarding recent losses of pets and home; provided with verbal information regarding AA support.  Continues on Q15 minute safety checks. Medications administered according to MD orders.  R: Pt attempting to counter negative thoughts with positive; agrees with need to maintain presence among peers on milieu.  Maintaining safety and safety contract.  Feels Trazodone helpful with sleep.

## 2012-06-17 NOTE — Progress Notes (Signed)
[facility] MD Progress Note  06/17/2012 12:40 PM  Diagnosis:   Axis I: Alcohol Abuse and Major Depression, single episode, Anxiety Disorder NOS Axis II: Deferred Axis III:  Past Medical History  Diagnosis Date  . Gout   . Hypertension   . Thyroid disease   . Mental disorder   . Depression   . Asthma    Axis IV: economic problems, housing problems, occupational problems and problems with primary support group Axis V: 51-60 moderate symptoms  ADL's:  Intact  Sleep: Fair  Appetite:  Fair  Suicidal Ideation:  Plan:  None Intent:  None Means:  None Homicidal Ideation:  Plan:  None Intent:  None Means:  None  AEB (as evidenced by):  Mental Status Examination/Evaluation: Objective:  Appearance: Fairly Groomed  Patent attorney::  Fair  Speech:  Clear and Coherent  Volume:  Normal  Mood:  Anxious and Irritable  Affect:  Appropriate  Thought Process:  Coherent, Intact, Linear and Logical  Orientation:  Full  Thought Content:  WDL  Suicidal Thoughts:  No  Homicidal Thoughts:  No  Memory:  Immediate;   Fair Recent;   Fair Remote;   Fair  Judgement:  Fair  Insight:  Fair  Psychomotor Activity:  Normal  Concentration:  Fair  Recall:  Fair  Akathisia:  No  Handed:  Right  AIMS (if indicated):     Assets:  Communication Skills Desire for Improvement Talents/Skills Vocational/Educational  Sleep:  Number of Hours: 6.25    Vital Signs:Blood pressure 152/100, pulse 54, temperature 97.4 F (36.3 C), temperature source Oral, resp. rate 16, height 5' 7.5" (1.715 m), weight 80.287 kg (177 lb), SpO2 97.00%. Current Medications: Current Facility-Administered Medications  Medication Dose Route Frequency Provider Last Rate Last Dose  . acetaminophen (TYLENOL) tablet 650 mg  650 mg Oral Q6H PRN Verne Spurr, PA-C      . albuterol (PROVENTIL HFA;VENTOLIN HFA) 108 (90 BASE) MCG/ACT inhaler 2 puff  2 puff Inhalation Q6H PRN Mickie D. Adams, PA      . alum & mag hydroxide-simeth  (MAALOX/MYLANTA) 200-200-20 MG/5ML suspension 30 mL  30 mL Oral Q4H PRN Verne Spurr, PA-C      . chlordiazePOXIDE (LIBRIUM) capsule 25 mg  25 mg Oral Q6H PRN Verne Spurr, PA-C   25 mg at 06/14/12 1610  . chlordiazePOXIDE (LIBRIUM) capsule 25 mg  25 mg Oral BH-qamhs Verne Spurr, PA-C   25 mg at 06/16/12 2129   Followed by  . chlordiazePOXIDE (LIBRIUM) capsule 25 mg  25 mg Oral Daily Verne Spurr, PA-C   25 mg at 06/17/12 0737  . hydrOXYzine (ATARAX/VISTARIL) tablet 25 mg  25 mg Oral Q6H PRN Verne Spurr, PA-C   25 mg at 06/13/12 2123  . levothyroxine (SYNTHROID, LEVOTHROID) tablet 100 mcg  100 mcg Oral Daily Mickie D. Adams, PA   100 mcg at 06/17/12 0736  . loperamide (IMODIUM) capsule 2-4 mg  2-4 mg Oral PRN Verne Spurr, PA-C      . loratadine (CLARITIN) tablet 10 mg  10 mg Oral Daily Verne Spurr, PA-C   10 mg at 06/17/12 0736  . magnesium hydroxide (MILK OF MAGNESIA) suspension 30 mL  30 mL Oral Daily PRN Verne Spurr, PA-C      . metoprolol (LOPRESSOR) tablet 50 mg  50 mg Oral BID Mickie D. Adams, PA   50 mg at 06/17/12 0737  . multivitamin with minerals tablet 1 tablet  1 tablet Oral Daily Verne Spurr, PA-C   1 tablet at  06/17/12 0736  . ondansetron (ZOFRAN-ODT) disintegrating tablet 4 mg  4 mg Oral Q6H PRN Verne Spurr, PA-C      . sertraline (ZOLOFT) tablet 50 mg  50 mg Oral Daily Mickie D. Adams, PA   50 mg at 06/17/12 0737  . thiamine (VITAMIN B-1) tablet 100 mg  100 mg Oral Daily Verne Spurr, PA-C   100 mg at 06/17/12 0736  . traZODone (DESYREL) tablet 50 mg  50 mg Oral QHS,MR X 1 Kerry Hough, PA   50 mg at 06/16/12 2129    Lab Results:  Results for orders placed during the hospital encounter of 06/13/12 (from the past 48 hour(s))  T4, FREE     Status: Normal   Collection Time   06/15/12  7:53 PM      Component Value Range Comment   Free T4 1.06  0.80 - 1.80 ng/dL   TSH     Status: Abnormal   Collection Time   06/15/12  7:53 PM      Component Value Range  Comment   TSH 5.422 (*) 0.350 - 4.500 uIU/mL     Physical Findings: AIMS: Facial and Oral Movements Muscles of Facial Expression: None, normal Lips and Perioral Area: None, normal Jaw: None, normal Tongue: None, normal,Extremity Movements Upper (arms, wrists, hands, fingers): None, normal Lower (legs, knees, ankles, toes): None, normal, Trunk Movements Neck, shoulders, hips: None, normal, Overall Severity Severity of abnormal movements (highest score from questions above): None, normal Incapacitation due to abnormal movements: None, normal Patient's awareness of abnormal movements (rate only patient's report): No Awareness, Dental Status Current problems with teeth and/or dentures?: No Does patient usually wear dentures?: No  CIWA:  CIWA-Ar Total: 1  COWS:     Treatment Plan Summary: Daily contact with patient to assess and evaluate symptoms and progress in treatment Medication management  Plan: Will work on coping skills/relapse prevention/CBT to address the anxiety (rather than using a PRN Klonopin as she was requesting) Will explore other placement options  Tanyika Barros A 06/17/2012, 12:40 PM

## 2012-06-18 NOTE — Progress Notes (Signed)
D: Patient appropriate and cooperative with staff and peers. Anxious at times. Patient reported on self inventory sheet that her energy level is normal and ability to pay attention is improving. She rated depression a "1". Also, patient reported on the inventory sheet, "I'm tired of sitting around in here".  A: Support and encouragement provided to patient. Administered scheduled medications per MD orders. Maintain 15 minute checks throughout the work day.  R: Patient receptive. Patient remains safe. Denies SI/HI/AVH and pain.

## 2012-06-18 NOTE — Progress Notes (Signed)
[facility] Adult Inpatient Family/Significant Other Suicide Prevention Education  Suicide Prevention Education:   Patient Refusal for Family/Significant Other Suicide Prevention Education: The patient has refused to provide written consent for family/significant other to be provided Family/Significant Other Suicide Prevention Education during admission and/or prior to discharge.  Physician notified.  CSW provided suicide prevention information with patient.    The suicide prevention education provided includes the following:  Suicide risk factors  Suicide prevention and interventions  National Suicide Hotline telephone number  [facility] assessment telephone number  [facility] Emergency Assistance 911  [facility] and/or Residential Mobile Crisis Unit telephone number   Anita Russell, [place] 06/18/2012 12:05 PM

## 2012-06-18 NOTE — Progress Notes (Signed)
Psychoeducational Group Note  Date:  06/18/2012 Time:  1100  Group Topic/Focus:  Rediscovering Joy:   The focus of this group is to explore various ways to relieve stress in a positive manner.  Participation Level:  Active  Participation Quality:  Appropriate and Attentive  Affect:  Appropriate  Cognitive:  Alert and Appropriate  Insight:  Good  Engagement in Group:  Good  Additional Comments:   Pt was observed having trouble remembering a time that was joyful to her. She did join her peers in identifying funny movies that elevate mood. She shared that she used to meditate and accepted a journal to begin a Gratitude Journal. Pt appeared to understand the importance of looking for happy, joyful things in life to shift mood from negative to positive.   Gwyndolyn Kaufman 06/18/2012, 2:21 PM

## 2012-06-18 NOTE — Progress Notes (Signed)
Psychoeducational Group Note  Date:  06/18/2012 Time:  2000  Group Topic/Focus:  Karaoke   Participation Level:  Active  Participation Quality:  Appropriate  Affect:  Appropriate  Cognitive:  Appropriate  Insight:  Good  Engagement in Group:  Good  Additional Comments:    Trine Fread A 06/18/2012, 10:19 PM

## 2012-06-18 NOTE — Social Work (Signed)
Aftercare Planning Group: 06/18/2012 9:45 AM  Pt attended discharge planning group and actively participated in group.  SW provided pt with today's workbook.  Pt presents with agitated affect and mood.  Pt rates depression at a 0 and anxiety at a 7-8 today.  Pt denies SI/HI.  Pt is still unsure of her d/c plans.  Pt has said numerous times she can stay on her friend's couch until she gets back on her feet.  Pt is scheduled to follow up at [facility] next week for medication management and therapy and [facility] on 11/7.  No further needs voiced by pt at this time.  Safety planning and suicide prevention discussed.  Pt participated in discussion and acknowledged an understanding of the information provided.         [facility] Group Notes:  (Counselor/Nursing/MHT/Case Management/Adjunct)  06/18/2012  1:15 PM  Type of Therapy:  Group Therapy  Participation Level:  Active  Participation Quality:  Appropriate and Attentive  Affect:  Appropriate  Cognitive:  Alert and Appropriate  Insight:  Good  Engagement in Group:  Good  Engagement in Therapy:  Good  Modes of Intervention:  Socialization and Support  Summary of Progress/Problems: The topic for group was balance in life.  Pt participated in the discussion about when their life was in balance and out of balance and how this feels.  Pt discussed ways to get back in balance and short term goals they can work on to get where they want to be.  Pt states that she had a balanced life for a majority of her life with a job, a house, a car and caring for her child.  Pt states that she lost everything and can pin point 2 years ago when her choices led to this downfall today.     Reyes Ivan, LCSWA 06/18/2012 10:08 AM

## 2012-06-18 NOTE — Progress Notes (Signed)
Psychoeducational Group Note  Date:  06/17/2012   Time:  2000  Group Topic/Focus:  AA group  Participation Level:  Active  Participation Quality:  Appropriate  Affect:  Appropriate  Cognitive:  Alert  Insight:  Good  Engagement in Group:  Good  Additional Comments:    Arie Powell R 06/18/2012, 12:42 AM

## 2012-06-18 NOTE — Progress Notes (Signed)
[facility] MD Progress Note  06/18/2012 7:08 PM  Diagnosis:   Axis I: Alcohol Abuse, Anxiety Disorder NOS and Depressive Disorder NOS Axis II: Deferred Axis III:  Past Medical History  Diagnosis Date  . Gout   . Hypertension   . Thyroid disease   . Mental disorder   . Depression   . Asthma    Axis IV: economic problems, housing problems, occupational problems and problems with primary support group Axis V: 51-60 moderate symptoms  ADL's:  Intact  Sleep: Fair  Appetite:  Fair  Suicidal Ideation:  Plan:  Denies Intent:  Denies Means:  Denies Homicidal Ideation:  Plan:  Denies Intent:  Denies Means:  Denies  Got a bed at [facility]. She is going to call her friends to see if they are going to allow her to stay at their house until then. She is more optimistic today  Mental Status Examination/Evaluation: Objective:  Appearance: Fairly Groomed  Patent attorney::  Fair  Speech:  Clear and Coherent  Volume:  Normal  Mood:  Worried  Affect:  Appropriate  Thought Process:  Coherent, Disorganized, Intact and Logical  Orientation:  Full  Thought Content:  WDL  Suicidal Thoughts:  No  Homicidal Thoughts:  No  Memory:  Immediate;   Fair Recent;   Fair Remote;   Fair  Judgement:  Intact  Insight:  Present  Psychomotor Activity:  Normal  Concentration:  Fair  Recall:  Fair  Akathisia:  No  Handed:  Right  AIMS (if indicated):     Assets:  Communication Skills Desire for Improvement Talents/Skills Vocational/Educational  Sleep:  Number of Hours: 6.5    Vital Signs:Blood pressure 134/96, pulse 71, temperature 97.6 F (36.4 C), temperature source Oral, resp. rate 16, height 5' 7.5" (1.715 m), weight 80.287 kg (177 lb), SpO2 97.00%. Current Medications: Current Facility-Administered Medications  Medication Dose Route Frequency Provider Last Rate Last Dose  . acetaminophen (TYLENOL) tablet 650 mg  650 mg Oral Q6H PRN Verne Spurr, PA-C      . albuterol (PROVENTIL HFA;VENTOLIN  HFA) 108 (90 BASE) MCG/ACT inhaler 2 puff  2 puff Inhalation Q6H PRN Mickie D. Adams, PA      . alum & mag hydroxide-simeth (MAALOX/MYLANTA) 200-200-20 MG/5ML suspension 30 mL  30 mL Oral Q4H PRN Verne Spurr, PA-C      . levothyroxine (SYNTHROID, LEVOTHROID) tablet 100 mcg  100 mcg Oral Daily Mickie D. Adams, PA   100 mcg at 06/18/12 0809  . loratadine (CLARITIN) tablet 10 mg  10 mg Oral Daily Verne Spurr, PA-C   10 mg at 06/18/12 0810  . magnesium hydroxide (MILK OF MAGNESIA) suspension 30 mL  30 mL Oral Daily PRN Verne Spurr, PA-C      . metoprolol (LOPRESSOR) tablet 50 mg  50 mg Oral BID Mickie D. Adams, PA   50 mg at 06/18/12 1724  . multivitamin with minerals tablet 1 tablet  1 tablet Oral Daily Verne Spurr, PA-C   1 tablet at 06/18/12 0809  . sertraline (ZOLOFT) tablet 50 mg  50 mg Oral Daily Mickie D. Adams, PA   50 mg at 06/18/12 0810  . thiamine (VITAMIN B-1) tablet 100 mg  100 mg Oral Daily Verne Spurr, PA-C   100 mg at 06/18/12 2956  . traZODone (DESYREL) tablet 50 mg  50 mg Oral QHS,MR X 1 Kerry Hough, PA   50 mg at 06/17/12 2140    Lab Results: No results found for this or any previous visit (  from the past 48 hour(s)).  Physical Findings: AIMS: Facial and Oral Movements Muscles of Facial Expression: None, normal Lips and Perioral Area: None, normal Jaw: None, normal Tongue: None, normal,Extremity Movements Upper (arms, wrists, hands, fingers): None, normal Lower (legs, knees, ankles, toes): None, normal, Trunk Movements Neck, shoulders, hips: None, normal, Overall Severity Severity of abnormal movements (highest score from questions above): None, normal Incapacitation due to abnormal movements: None, normal Patient's awareness of abnormal movements (rate only patient's report): No Awareness, Dental Status Current problems with teeth and/or dentures?: No Does patient usually wear dentures?: No  CIWA:  CIWA-Ar Total: 0  COWS:     Treatment Plan Summary: Daily  contact with patient to assess and evaluate symptoms and progress in treatment Medication management  Plan: Supportive approach/coping skills/relapse prevention           Facilitate placement Anita Russell A 06/18/2012, 7:08 PM

## 2012-06-18 NOTE — Progress Notes (Signed)
D: Patient appropriate and active within the milieu today. Has complained of anxiety,however she does state she is feeling better than when she came in. Pt did attend evening group activity and has received all medication without incident. A: Support and encouragement provided to patient.  R. Will continue to monitor.

## 2012-06-19 DIAGNOSIS — F339 Major depressive disorder, recurrent, unspecified: Secondary | ICD-10-CM

## 2012-06-19 MED ORDER — METOPROLOL TARTRATE 50 MG PO TABS: 50.0000 mg | ORAL_TABLET | Freq: Two times a day (BID) | ORAL | Status: DC

## 2012-06-19 MED ORDER — TRAZODONE HCL 50 MG PO TABS: 50.0000 mg | ORAL_TABLET | Freq: Every evening | ORAL | Status: DC | PRN

## 2012-06-19 MED ORDER — SERTRALINE HCL 50 MG PO TABS: 50.0000 mg | ORAL_TABLET | Freq: Every day | ORAL | Status: DC

## 2012-06-19 MED ORDER — LEVOTHYROXINE SODIUM 100 MCG PO TABS: 100.0000 ug | ORAL_TABLET | Freq: Every day | ORAL | Status: DC

## 2012-06-19 NOTE — Discharge Summary (Signed)
Physician Discharge Summary Note  Patient:  Anita Russell is an 56 y.o., female MRN:  [id number] DOB:  [birth year] Patient phone:  [phone] (home)  Patient address:   [address] [place] [address]   Date of Admission:  06/13/2012 Date of Discharge: 06/19/2012   Discharge Diagnoses: Principal Problem:  *Alcohol abuse Active Problems:  Gout  Hypertension  Hypothyroid  Depression with suicidal ideation  Anxiety  Axis Diagnosis:   AXIS I: Alcohol Abuse and Major Depression, Recurrent severe  AXIS II: Deferred  AXIS III:  Past Medical History   Diagnosis  Date   .  Gout    .  Hypertension    .  Thyroid disease    .  Mental disorder    .  Depression    .  Asthma    AXIS IV: economic problems, housing problems, occupational problems, other psychosocial or environmental problems and problems with primary support group  AXIS V: 41-50 serious symptoms   Level of Care:  [facility]  Hospital Course:   [name] was admitted for detox from alcohol,and crisis management.  He/she was treated with the standard Librium protocol.  Medical problems were identified and treated.  Home medication was restarted as appropriate.     Improvement was monitored by CIWA/COWS scores and patient's daily report of withdrawal symptom reduction. Emotional and mental status was monitored by daily self inventory reports completed by the patient and clinical staff.      The patient was evaluated by the treatment team for stability and plans for continued recovery upon discharge. She was offered further treatment options upon discharge including Residential, IOP, and Outpatient treatment.  The patient's motivation was an integral factor for scheduling further treatment.  Employment, transportation, bed availability, health status, family support, and any pending legal issues were also considered.    Upon completion of detox the patient was both mentally and medically stable for discharge.     Medication List   As of 06/19/2012 11:06 AM  STOP taking these medications         clonazePAM 1 MG tablet   Commonly known as: KLONOPIN    TAKE these medications      Indication    albuterol 108 (90 BASE) MCG/ACT inhaler   Commonly known as: PROVENTIL HFA;VENTOLIN HFA   Inhale 2 puffs into the lungs every 6 (six) hours as needed. For shortness of breath    Asthma/COPD/shortness of breath    levothyroxine 100 MCG tablet   Commonly known as: SYNTHROID, LEVOTHROID   Take 1 tablet (100 mcg total) by mouth daily. For thyroid disease.    Indication: Underactive Thyroid      metoprolol 50 MG tablet   Commonly known as: LOPRESSOR   Take 1 tablet (50 mg total) by mouth 2 (two) times daily. For hypertension.    for hypertension    sertraline 50 MG tablet   Commonly known as: ZOLOFT   Take 1 tablet (50 mg total) by mouth daily. For anxiety and depression.    Indication: Anxiety Disorder      traZODone 50 MG tablet   Commonly known as: DESYREL   Take 1 tablet (50 mg total) by mouth at bedtime and may repeat dose one time if needed. For insomnia.    Indication: Trouble Sleeping     Follow-up Information    Follow up with [facility]. On 06/23/2012. (Appointment scheduled at 1:15 pm with Brand Males, NP)    Contact information:   [address] [place],  [place] [address] [phone]      Follow up with [facility]. On 07/02/2012. (Arrive at 8:00 am promptly!!)    Contact information:   [address] [place], [place] [address] [phone]    Upon discharge, patient adamantly denies suicidal, homicidal ideations, auditory, visual hallucinations and or delusional thinking. They left [facility] with all personal belongings via personal transportation in no apparent distress.  Consults:  none  Significant Diagnostic Studies:  Labs: none  Discharge Vitals:   Blood pressure 118/85, pulse 73, temperature 97 F (36.1 C), temperature source Oral, resp. rate 16, height 5' 7.5" (1.715  m), weight 80.287 kg (177 lb), SpO2 97.00%..  Mental Status Exam: See Mental Status Examination and Suicide Risk Assessment completed by Attending Physician prior to discharge.  Discharge destination:  Home  Is patient on multiple antipsychotic therapies at discharge:  No  Has Patient had three or more failed trials of antipsychotic monotherapy by history: N/A Recommended Plan for Multiple Antipsychotic Therapies: N/A Discharge Orders    Future Orders Please Complete By Expires   Diet - low sodium heart healthy      Increase activity slowly      Discharge instructions      Comments:   Take all of your medications as prescribed.  Be sure to keep ALL follow up appointments as scheduled. This is to ensure getting your refills on time to avoid any interruption in your medication.  If you find that you can not keep your appointment, call the clinic and reschedule. Be sure to tell the nurse if you will need a refill before your appointment.    Rona Ravens. Nechelle Petrizzo PAC 06/19/2012 11:06 AM

## 2012-06-19 NOTE — Tx Team (Signed)
Interdisciplinary Treatment Plan Update (Adult)  Date:  06/19/2012  Time Reviewed:  9:44 AM   Progress in Treatment: Attending groups: Yes Participating in groups:  Yes Taking medication as prescribed: Yes Tolerating medication:  Yes Family/Significant othe contact made:  Pt refused Patient understands diagnosis:  Yes Discussing patient identified problems/goals with staff:  Yes Medical problems stabilized or resolved:  Yes Denies suicidal/homicidal ideation: Yes Issues/concerns per patient self-inventory:  None identified Other: N/A  New problem(s) identified: None Identified  Reason for Continuation of Hospitalization: Stable to d/c  Interventions implemented related to continuation of hospitalization: Stable to d/c  Additional comments: N/A  Estimated length of stay: D/C today  Discharge Plan: Pt will follow up with [facility] for medication management and therapy and [facility].    New goal(s): N/A  Review of initial/current patient goals per problem list:    1.  Goal(s): Address substance use  Met:  Yes  Target date: by discharge  As evidenced by: completed detox protocol and referred to appropriate treatment  2.  Goal (s): Reduce depressive and anxiety symptoms  Met:  Yes  Target date: by discharge  As evidenced by: Reducing depression from a 10 to a 3 as reported by pt.  Pt rates at a 0 and anxiety at a 5 today.   3.  Goal(s): Eliminate SI  Met:  Yes  Target date: by discharge  As evidenced by: Pt denies SI.    Attendees: Patient:  Anita Russell  06/19/2012 9:44 AM   Family:     Physician:  Geoffery Lyons, MD 06/19/2012 9:44 AM   Nursing: Alease Frame, RN 06/19/2012 9:44 AM   Clinical Social Worker:  Reyes Ivan, LCSWA 06/19/2012 9:44 AM   Other: Stephannie Li, RN 06/19/2012 9:44 AM   Other:  Ashley Jacobs, LCSW 06/19/2012 9:45 AM   Other:     Other:     Other:      Scribe for Treatment Team:   Reyes Ivan 06/19/2012 9:44 AM

## 2012-06-19 NOTE — [facility] Suicide Risk Assessment (Signed)
Suicide Risk Assessment  Discharge Assessment     Demographic Factors:  Divorced or widowed, Caucasian, Living alone and Unemployed  Mental Status Per Nursing Assessment::   On Admission:  Self-harm thoughts  Current Mental Status by Physician: Denies suicidal ideas, intent or plan  Loss Factors: Financial problems/change in socioeconomic status  Historical Factors: NA  Risk Reduction Factors:   Had been succesful in the past. Has been able to cope with losses before. Realization that she needs to reinvent herself  Continued Clinical Symptoms:  Depression improve, anxiety improved, better coping skills.  Cognitive Features That Contribute To Risk: None  Suicide Risk: Minimal  Discharge Diagnoses:   AXIS I:  Major Depression recurrent, Anxiety Disorder NOS, Alcohol Abuse AXIS II:  Deferred AXIS III:   Past Medical History  Diagnosis Date  . Gout   . Hypertension   . Thyroid disease   . Mental disorder   . Depression   . Asthma    AXIS IV:  economic problems, housing problems, occupational problems and problems with primary support group AXIS V:  61-70 mild symptoms  Plan Of Care/Follow-up recommendations:  Activity:  As tolerated Diet:  Regular To be admitted to [facility] Nov 1  Will go to AA, get a sponsor, follow up with [facility]  Is patient on multiple antipsychotic therapies at discharge:  No   Has Patient had three or more failed trials of antipsychotic monotherapy by history:  No  Recommended Plan for Multiple Antipsychotic Therapies: N/A  Adilee Lemme A 06/19/2012, 11:41 AM

## 2012-06-19 NOTE — Social Work (Signed)
[facility] Group Notes:  (Counselor/Nursing/MHT/Case Management/Adjunct)  06/19/2012  1:15 PM  Type of Therapy:  Group Therapy  Participation Level:  Appropriate  Participation Quality:  Appropriate   Affect:  Appropriate  Cognitive:  Alert  Insight:  Good  Engagement in Group:  Good  Engagement in Therapy:  Good  Modes of Intervention:  Socialization and Support  Summary of Progress/Problems: The topic for today was feelings about relapse.  Pt discussed what relapse prevention is to them and identified triggers that they are on the path to relapse.  Pt processed their feeling towards relapse and was able to relate to peers.  Pt discussed coping skills that can be used for relapse prevention.     Reyes Ivan, [place] 06/19/2012  2:36 PM

## 2012-06-19 NOTE — Progress Notes (Signed)
Patient ID: Anita Russell, female   DOB: 07-24-1956, 56 y.o.   MRN: [id number]   D: Patient lying in bed with eyes closed. Appears to be sleeping. Respirations even and non-labored. A: Staff will monitor on q 15 minute checks and follow treatment and give meds as ordered. R: No response from patient due to sleeping.

## 2012-06-19 NOTE — Progress Notes (Signed)
[facility] Case Management Discharge Plan:  Will you be returning to the same living situation after discharge: No. Patient was to stay with a friend on his couch until [facility] Appointment is available.  Patient will be stay at an extended motel in which she arranged herself. At discharge, do you have transportation home?:Yes,  Her own car/transportation Do you have the ability to pay for your medications:Yes,  no barriers       Release of information consent forms completed and in the chart;  Patient's signature needed at discharge.  Patient to Follow up at:  Follow-up Information    Follow up with [facility]. On 06/23/2012. (Appointment scheduled at 1:15 pm with Brand Males, NP)    Contact information:   [address], [place] [address] [phone]      Follow up with [facility]. On 07/02/2012. (Arrive at 8:00 am promptly!!)    Contact information:   [address] [place], [place] [address] [phone]         Patient denies SI/HI:   No.  Patient denies any SI/HI without plan or intent.  Safety Planning and Suicide Prevention discussed:  Yes,  Discussed warning signs and educaiton with patient.  Patient voices understanding and given information of places to go if she becomes suicidal.  Barrier to discharge identified:Yes,  no barriers to DC.  Summary and Recommendations:  Patient attended after care planning group this morning. Her mood and affect and pleasant and able to recognize her warning signs and provide insight as her anxiety rises.  Her depression is listed at zero with anxiety is a 5 in which she is worried about leaving and staying sober and safe. Patient able to identify supports as her friends as well as attending A.A meetings, wellness academy groups, [facility] for peer to peer specialist.  Patient reports no other needs at DC, no barriers to dc.     Nail, Catalina Gravel 06/19/2012, 10:09 AM

## 2012-06-19 NOTE — Progress Notes (Signed)
Psychoeducational Group Note  Date:  06/19/2012 Time:  1100  Group Topic/Focus:  Relapse Prevention Planning:   The focus of this group is to define relapse and discuss the need for planning to combat relapse.  Participation Level:  Active  Participation Quality:  Appropriate, Attentive, Sharing and Supportive  Affect:  Appropriate  Cognitive:  Alert and Appropriate  Insight:  Good  Engagement in Group:  Good  Additional Comments:  Pt attended and participated in group discussing relapse prevention planning.   Dalia Heading 06/19/2012, 6:23 PM

## 2012-06-19 NOTE — Progress Notes (Signed)
Patient ID: Anita Russell, female   DOB: Apr 01, 1956, 56 y.o.   MRN: [id number] Nsg discharge note: Patient cooperative during d/c process. Reviewed all f/u appointments and medication instructions. Patient verbalized understanding.  Sample rx's given.  Denies SI. Denies overt s/s of etoh withdrawal.  All belongings returned and escorted to lobby to await family member pick up.

## 2012-06-20 NOTE — Progress Notes (Signed)
[facility] Group Notes:  (Counselor/Nursing/MHT/Case Management/Adjunct)  06/20/2012 12:13 PM  Type of Therapy:  Group Therapy  Participation Level:  Did Not Attend  Usher Hedberg Renee 06/20/2012, 12:13 PM 

## 2012-06-24 ENCOUNTER — Encounter (HOSPITAL_BASED_OUTPATIENT_CLINIC_OR_DEPARTMENT_OTHER): Payer: Self-pay | Admitting: Emergency Medicine

## 2012-06-24 ENCOUNTER — Emergency Department (HOSPITAL_BASED_OUTPATIENT_CLINIC_OR_DEPARTMENT_OTHER)
Admission: EM | Admit: 2012-06-24 | Discharge: 2012-06-24 | Disposition: A | Discharge status: Home / Self Care | Payer: Self-pay | Attending: Emergency Medicine | Admitting: Emergency Medicine

## 2012-06-24 DIAGNOSIS — E079 Disorder of thyroid, unspecified: Secondary | ICD-10-CM | POA: Insufficient documentation

## 2012-06-24 DIAGNOSIS — F329 Major depressive disorder, single episode, unspecified: Secondary | ICD-10-CM | POA: Insufficient documentation

## 2012-06-24 DIAGNOSIS — F17200 Nicotine dependence, unspecified, uncomplicated: Secondary | ICD-10-CM | POA: Insufficient documentation

## 2012-06-24 DIAGNOSIS — F3289 Other specified depressive episodes: Secondary | ICD-10-CM | POA: Insufficient documentation

## 2012-06-24 DIAGNOSIS — M109 Gout, unspecified: Secondary | ICD-10-CM | POA: Insufficient documentation

## 2012-06-24 DIAGNOSIS — J45909 Unspecified asthma, uncomplicated: Secondary | ICD-10-CM | POA: Insufficient documentation

## 2012-06-24 DIAGNOSIS — F489 Nonpsychotic mental disorder, unspecified: Secondary | ICD-10-CM | POA: Insufficient documentation

## 2012-06-24 DIAGNOSIS — I10 Essential (primary) hypertension: Secondary | ICD-10-CM | POA: Insufficient documentation

## 2012-06-24 DIAGNOSIS — Z79899 Other long term (current) drug therapy: Secondary | ICD-10-CM | POA: Insufficient documentation

## 2012-06-24 MED ORDER — OXYCODONE-ACETAMINOPHEN 5-325 MG PO TABS: 1.0000 | ORAL_TABLET | Freq: Four times a day (QID) | ORAL | Status: DC | PRN

## 2012-06-24 MED ORDER — PREDNISONE 10 MG PO TABS: 20.0000 mg | ORAL_TABLET | Freq: Two times a day (BID) | ORAL | Status: DC

## 2012-06-24 NOTE — Progress Notes (Signed)
Patient Discharge Instructions:  After Visit Summary (AVS):   Faxed to:  06/24/12 Psychiatric Admission Assessment Note:   Faxed to:  06/24/12 Suicide Risk Assessment - Discharge Assessment:   Faxed to:  06/24/12 Faxed/Sent to the Next Level Care provider:  06/24/12 Faxed to [facility] @ [phone] Faxed to [facility] @ [phone]  Jerelene Redden, 06/24/2012, 3:35 PM

## 2012-06-24 NOTE — ED Notes (Signed)
Pt c/o Rt foot pain x 3 days. Pt denies injury. Pt reports hx gout.

## 2012-06-24 NOTE — ED Provider Notes (Signed)
History     CSN: [id number]  Arrival date & time 06/24/12  4098   First MD Initiated Contact with Patient 06/24/12 1034      Chief Complaint  Patient presents with  . Foot Pain    (Consider location/radiation/quality/duration/timing/severity/associated sxs/prior treatment) HPI Comments: Patient with history of gout, toe red and swollen and very painful.  Similar to prior gouty episodes.    Patient is a 56 y.o. female presenting with lower extremity pain. The history is provided by the patient.  Foot Pain This is a new problem. Episode onset: 3 days ago. The problem occurs constantly. The problem has been rapidly worsening. The symptoms are aggravated by walking (movement, palpation). Nothing relieves the symptoms. She has tried nothing for the symptoms.    Past Medical History  Diagnosis Date  . Gout   . Hypertension   . Thyroid disease   . Mental disorder   . Depression   . Asthma     Past Surgical History  Procedure Date  . Appendectomy   . Abdominal hysterectomy     No family history on file.  History  Substance Use Topics  . Smoking status: Current Every Day Smoker -- 1.0 packs/day    Types: Cigarettes  . Smokeless tobacco: Not on file  . Alcohol Use: No    OB History    Grav Para Term Preterm Abortions TAB SAB Ect Mult Living                  Review of Systems  All other systems reviewed and are negative.    Allergies  Review of patient's allergies indicates no known allergies.  Home Medications   Current Outpatient Rx  Name Route Sig Dispense Refill  . ALBUTEROL SULFATE HFA 108 (90 BASE) MCG/ACT IN AERS Inhalation Inhale 2 puffs into the lungs every 6 (six) hours as needed. For shortness of breath    . LEVOTHYROXINE SODIUM 100 MCG PO TABS Oral Take 1 tablet (100 mcg total) by mouth daily. For thyroid disease.    Marland Kitchen METOPROLOL TARTRATE 50 MG PO TABS Oral Take 1 tablet (50 mg total) by mouth 2 (two) times daily. For hypertension.    .  SERTRALINE HCL 50 MG PO TABS Oral Take 1 tablet (50 mg total) by mouth daily. For anxiety and depression. 30 tablet 0    BP 117/71  Pulse 78  Temp 98.3 F (36.8 C) (Oral)  Resp 18  Ht 5\' 9"  (1.753 m)  Wt 183 lb (83.008 kg)  BMI 27.02 kg/m2  SpO2 95%  Physical Exam  Nursing note and vitals reviewed. Constitutional: She appears well-developed and well-nourished. No distress.  HENT:  Head: Normocephalic and atraumatic.  Mouth/Throat: Oropharynx is clear and moist.  Neck: Normal range of motion. Neck supple.  Musculoskeletal:       The right mtp joint has severe pain with range of motion.  There is redness and warmth present.  Neurological: She is alert.  Skin: Skin is warm and dry. She is not diaphoretic.    ED Course  Procedures (including critical care time)  Labs Reviewed - No data to display No results found.   No diagnosis found.    MDM  Will treat with prednisone, percocet.  Follow up prn.         Geoffery Lyons, MD 06/24/12 [phone]

## 2012-06-24 NOTE — ED Notes (Signed)
Chart reviewed.

## 2012-06-29 NOTE — Discharge Summary (Signed)
Agree with assessment and plan Stephane Junkins A. Jacory Kamel, M.D. 

## 2012-09-06 ENCOUNTER — Emergency Department (HOSPITAL_BASED_OUTPATIENT_CLINIC_OR_DEPARTMENT_OTHER): Payer: Self-pay

## 2012-09-06 ENCOUNTER — Emergency Department (HOSPITAL_BASED_OUTPATIENT_CLINIC_OR_DEPARTMENT_OTHER)
Admission: EM | Admit: 2012-09-06 | Discharge: 2012-09-06 | Disposition: A | Discharge status: Home / Self Care | Payer: Self-pay | Attending: Emergency Medicine | Admitting: Emergency Medicine

## 2012-09-06 ENCOUNTER — Encounter (HOSPITAL_BASED_OUTPATIENT_CLINIC_OR_DEPARTMENT_OTHER): Payer: Self-pay | Admitting: Emergency Medicine

## 2012-09-06 DIAGNOSIS — Z8659 Personal history of other mental and behavioral disorders: Secondary | ICD-10-CM | POA: Insufficient documentation

## 2012-09-06 DIAGNOSIS — Z862 Personal history of diseases of the blood and blood-forming organs and certain disorders involving the immune mechanism: Secondary | ICD-10-CM | POA: Insufficient documentation

## 2012-09-06 DIAGNOSIS — F3289 Other specified depressive episodes: Secondary | ICD-10-CM | POA: Insufficient documentation

## 2012-09-06 DIAGNOSIS — R509 Fever, unspecified: Secondary | ICD-10-CM | POA: Insufficient documentation

## 2012-09-06 DIAGNOSIS — J45909 Unspecified asthma, uncomplicated: Secondary | ICD-10-CM | POA: Insufficient documentation

## 2012-09-06 DIAGNOSIS — R062 Wheezing: Secondary | ICD-10-CM | POA: Insufficient documentation

## 2012-09-06 DIAGNOSIS — IMO0001 Reserved for inherently not codable concepts without codable children: Secondary | ICD-10-CM | POA: Insufficient documentation

## 2012-09-06 DIAGNOSIS — E079 Disorder of thyroid, unspecified: Secondary | ICD-10-CM | POA: Insufficient documentation

## 2012-09-06 DIAGNOSIS — I10 Essential (primary) hypertension: Secondary | ICD-10-CM | POA: Insufficient documentation

## 2012-09-06 DIAGNOSIS — F17200 Nicotine dependence, unspecified, uncomplicated: Secondary | ICD-10-CM | POA: Insufficient documentation

## 2012-09-06 DIAGNOSIS — Z8639 Personal history of other endocrine, nutritional and metabolic disease: Secondary | ICD-10-CM | POA: Insufficient documentation

## 2012-09-06 DIAGNOSIS — F329 Major depressive disorder, single episode, unspecified: Secondary | ICD-10-CM | POA: Insufficient documentation

## 2012-09-06 MED ORDER — ALBUTEROL SULFATE HFA 108 (90 BASE) MCG/ACT IN AERS
2.0000 | INHALATION_SPRAY | RESPIRATORY_TRACT | Status: DC | PRN
  Administered 2012-09-06: 2 via RESPIRATORY_TRACT
  Filled 2012-09-06: qty 6.7

## 2012-09-06 NOTE — ED Notes (Signed)
Cough, fever and body aches x 2 days.  Better today.

## 2012-09-06 NOTE — ED Provider Notes (Signed)
History     CSN: [id number]  Arrival date & time 09/06/12  4098   First MD Initiated Contact with Patient 09/06/12 [phone]      Chief Complaint  Patient presents with  . Cough  . Fever  . Generalized Body Aches    (Consider location/radiation/quality/duration/timing/severity/associated sxs/prior treatment) HPI Pt presents with c/o cough, body aches and subjective fever for the past 2 days.  She states she feels much better today.  Is currently in treatment at [facility] and states they encouraged her to come.  She has been using advair for asthma- but does not have any albuterol.  No hx hospitalizations or intubations.  No chest pain or leg swelling.  No vomiting or diarrhea.  States she has been drinking lots of water.  She did receive her flu shot this year.  There are no other associated systemic symptoms, there are no other alleviating or modifying factors.   Past Medical History  Diagnosis Date  . Gout   . Hypertension   . Thyroid disease   . Mental disorder   . Depression   . Asthma     Past Surgical History  Procedure Date  . Appendectomy   . Abdominal hysterectomy     History reviewed. No pertinent family history.  History  Substance Use Topics  . Smoking status: Current Every Day Smoker -- 1.0 packs/day    Types: Cigarettes  . Smokeless tobacco: Not on file  . Alcohol Use: No    OB History    Grav Para Term Preterm Abortions TAB SAB Ect Mult Living                  Review of Systems ROS reviewed and all otherwise negative except for mentioned in HPI  Allergies  Review of patient's allergies indicates no known allergies.  Home Medications   Current Outpatient Rx  Name  Route  Sig  Dispense  Refill  . TRAMADOL HCL 50 MG PO TABS   Oral   Take 50 mg by mouth every 6 (six) hours as needed.         . ALBUTEROL SULFATE HFA 108 (90 BASE) MCG/ACT IN AERS   Inhalation   Inhale 2 puffs into the lungs every 6 (six) hours as needed. For shortness of  breath         . LEVOTHYROXINE SODIUM 100 MCG PO TABS   Oral   Take 1 tablet (100 mcg total) by mouth daily. For thyroid disease.         Marland Kitchen METOPROLOL TARTRATE 50 MG PO TABS   Oral   Take 1 tablet (50 mg total) by mouth 2 (two) times daily. For hypertension.         . SERTRALINE HCL 50 MG PO TABS   Oral   Take 1 tablet (50 mg total) by mouth daily. For anxiety and depression.   30 tablet   0     BP 151/87  Pulse 76  Temp 98.2 F (36.8 C) (Oral)  Resp 18  Ht 5\' 9"  (1.753 m)  Wt 180 lb (81.647 kg)  BMI 26.58 kg/m2  SpO2 97% Vitals reviewed Physical Exam Physical Examination: General appearance - alert, well appearing, and in no distress Mental status - alert, oriented to person, place, and time Eyes - no conjunctival injection, no scleral icterus Mouth - mucous membranes moist, pharynx normal without lesions Chest - mild bilateral expiratory wheezing, no increased respiratory effort, no rales or rhonchi, symmetric air entry Heart -  normal rate, regular rhythm, normal S1, S2, no murmurs, rubs, clicks or gallops Abdomen - soft, nontender, nondistended, no masses or organomegaly Extremities - peripheral pulses normal, no pedal edema, no clubbing or cyanosis Skin - normal coloration and turgor, no rashes, brisk cap refill  ED Course  Procedures (including critical care time)  Labs Reviewed - No data to display Dg Chest 2 View  09/06/2012  *RADIOLOGY REPORT*  Clinical Data: Cough, fever  CHEST - 2 VIEW  Comparison: 06/23/2007  Findings: Stable heart size and vascularity. Lungs remain clear. No focal airspace process, collapse, consolidation, edema, effusion or pneumothorax.  Trachea is midline.  Midline retrocardiac density noted, suspicious for a hiatal hernia.  IMPRESSION: No acute chest process  Probable hiatal hernia   Original Report Authenticated By: Judie Petit. Shick, M.D.      1. Wheezing   2. Influenza-like illness       MDM  Pt presenting with c/o cough,  subjective fever and body aches.  She has very mild expiratory wheezing on exam.  Pt is overall nontoxic and well hydrated in appearance.  Will give albuterol inhaler.  CXR shows no infiltrate- more appearance of bronchitic changes.  CXR images reviewed by me as well.  Discharged with strict return precautions.  Pt agreeable with plan.        Ethelda Chick, MD 09/06/12 [phone]

## 2012-11-26 ENCOUNTER — Encounter (HOSPITAL_COMMUNITY): Payer: Self-pay | Admitting: Emergency Medicine

## 2012-11-26 ENCOUNTER — Emergency Department (HOSPITAL_COMMUNITY)
Admission: EM | Admit: 2012-11-26 | Discharge: 2012-11-27 | Disposition: A | Discharge status: Other Acute Inpatient Hospital | Payer: Self-pay | Attending: Emergency Medicine | Admitting: Emergency Medicine

## 2012-11-26 DIAGNOSIS — H5500 Unspecified nystagmus: Secondary | ICD-10-CM | POA: Insufficient documentation

## 2012-11-26 DIAGNOSIS — R45851 Suicidal ideations: Secondary | ICD-10-CM | POA: Insufficient documentation

## 2012-11-26 DIAGNOSIS — I10 Essential (primary) hypertension: Secondary | ICD-10-CM | POA: Insufficient documentation

## 2012-11-26 DIAGNOSIS — F1010 Alcohol abuse, uncomplicated: Secondary | ICD-10-CM | POA: Insufficient documentation

## 2012-11-26 DIAGNOSIS — F17200 Nicotine dependence, unspecified, uncomplicated: Secondary | ICD-10-CM | POA: Insufficient documentation

## 2012-11-26 DIAGNOSIS — Z862 Personal history of diseases of the blood and blood-forming organs and certain disorders involving the immune mechanism: Secondary | ICD-10-CM | POA: Insufficient documentation

## 2012-11-26 DIAGNOSIS — Z8639 Personal history of other endocrine, nutritional and metabolic disease: Secondary | ICD-10-CM | POA: Insufficient documentation

## 2012-11-26 DIAGNOSIS — R000 Tachycardia, unspecified: Secondary | ICD-10-CM | POA: Insufficient documentation

## 2012-11-26 DIAGNOSIS — J45909 Unspecified asthma, uncomplicated: Secondary | ICD-10-CM | POA: Insufficient documentation

## 2012-11-26 DIAGNOSIS — R4789 Other speech disturbances: Secondary | ICD-10-CM | POA: Insufficient documentation

## 2012-11-26 DIAGNOSIS — F329 Major depressive disorder, single episode, unspecified: Secondary | ICD-10-CM | POA: Insufficient documentation

## 2012-11-26 DIAGNOSIS — E039 Hypothyroidism, unspecified: Secondary | ICD-10-CM | POA: Insufficient documentation

## 2012-11-26 DIAGNOSIS — F489 Nonpsychotic mental disorder, unspecified: Secondary | ICD-10-CM | POA: Insufficient documentation

## 2012-11-26 DIAGNOSIS — Z79899 Other long term (current) drug therapy: Secondary | ICD-10-CM | POA: Insufficient documentation

## 2012-11-26 DIAGNOSIS — F3289 Other specified depressive episodes: Secondary | ICD-10-CM | POA: Insufficient documentation

## 2012-11-26 LAB — BASIC METABOLIC PANEL
CO2: 23 mEq/L (ref 19–32)
Calcium: 9 mg/dL (ref 8.4–10.5)
Creatinine, Ser: 0.61 mg/dL (ref 0.50–1.10)
GFR calc non Af Amer: >90 mL/min (ref >90)
Sodium: 143 mEq/L (ref 135–145)

## 2012-11-26 LAB — CBC WITH DIFFERENTIAL/PLATELET
Basophils Absolute: 0 10*3/uL (ref 0.0–0.1)
Basophils Relative: 0 % (ref 0–1)
Eosinophils Absolute: 0.2 10*3/uL (ref 0.0–0.7)
Eosinophils Relative: 4 % (ref 0–5)
HCT: 44.8 % (ref 36.0–46.0)
Hemoglobin: 15.3 g/dL — ABNORMAL HIGH (ref 12.0–15.0)
Lymphocytes Relative: 50 % — ABNORMAL HIGH (ref 12–46)
Lymphs Abs: 2.6 10*3/uL (ref 0.7–4.0)
MCH: 29.8 pg (ref 26.0–34.0)
MCHC: 34.2 g/dL (ref 30.0–36.0)
MCV: 87.3 fL (ref 78.0–100.0)
Monocytes Absolute: 0.3 K/uL (ref 0.1–1.0)
Monocytes Relative: 5 % (ref 3–12)
Neutro Abs: 2.1 K/uL (ref 1.7–7.7)
Neutrophils Relative %: 41 % — ABNORMAL LOW (ref 43–77)
Platelets: 217 10*3/uL (ref 150–400)
RBC: 5.13 MIL/uL — ABNORMAL HIGH (ref 3.87–5.11)
RDW: 14 % (ref 11.5–15.5)
WBC: 5.2 10*3/uL (ref 4.0–10.5)

## 2012-11-26 LAB — RAPID URINE DRUG SCREEN, HOSP PERFORMED
Amphetamines: NOT DETECTED
Barbiturates: NOT DETECTED
Benzodiazepines: NOT DETECTED
Cocaine: NOT DETECTED
Opiates: NOT DETECTED
Tetrahydrocannabinol: NOT DETECTED

## 2012-11-26 LAB — BASIC METABOLIC PANEL WITH GFR
BUN: 9 mg/dL (ref 6–23)
Chloride: 106 meq/L (ref 96–112)
GFR calc Af Amer: >90 mL/min (ref >90)
Glucose, Bld: 98 mg/dL (ref 70–99)
Potassium: 3.9 meq/L (ref 3.5–5.1)

## 2012-11-26 LAB — ETHANOL: Alcohol, Ethyl (B): 197 mg/dL — ABNORMAL HIGH (ref 0–11)

## 2012-11-26 MED ORDER — ALUM & MAG HYDROXIDE-SIMETH 200-200-20 MG/5ML PO SUSP: 30.0000 mL | ORAL | Status: DC | PRN

## 2012-11-26 MED ORDER — SERTRALINE HCL 50 MG PO TABS
50.0000 mg | ORAL_TABLET | Freq: Every day | ORAL | Status: DC
  Filled 2012-11-26: qty 1

## 2012-11-26 MED ORDER — LEVOTHYROXINE SODIUM 100 MCG PO TABS
100.0000 ug | ORAL_TABLET | Freq: Every day | ORAL | Status: DC
  Administered 2012-11-27: 100 ug via ORAL
  Filled 2012-11-26 (×2): qty 1

## 2012-11-26 MED ORDER — CLONAZEPAM 0.5 MG PO TABS: 0.5000 mg | ORAL_TABLET | Freq: Two times a day (BID) | ORAL | Status: DC | PRN

## 2012-11-26 MED ORDER — ZOLPIDEM TARTRATE 5 MG PO TABS: 5.0000 mg | ORAL_TABLET | Freq: Every evening | ORAL | Status: DC | PRN

## 2012-11-26 MED ORDER — IBUPROFEN 600 MG PO TABS: 600.0000 mg | ORAL_TABLET | Freq: Three times a day (TID) | ORAL | Status: DC | PRN

## 2012-11-26 MED ORDER — LORAZEPAM 1 MG PO TABS: 1.0000 mg | ORAL_TABLET | Freq: Three times a day (TID) | ORAL | Status: DC | PRN

## 2012-11-26 MED ORDER — TRAMADOL HCL 50 MG PO TABS: 50.0000 mg | ORAL_TABLET | Freq: Four times a day (QID) | ORAL | Status: DC | PRN

## 2012-11-26 MED ORDER — NICOTINE 21 MG/24HR TD PT24
21.0000 mg | MEDICATED_PATCH | Freq: Every day | TRANSDERMAL | Status: DC
  Administered 2012-11-27: 21 mg via TRANSDERMAL
  Filled 2012-11-26: qty 1

## 2012-11-26 MED ORDER — ALBUTEROL SULFATE HFA 108 (90 BASE) MCG/ACT IN AERS: 2.0000 | INHALATION_SPRAY | Freq: Four times a day (QID) | RESPIRATORY_TRACT | Status: DC | PRN

## 2012-11-26 MED ORDER — SERTRALINE HCL 50 MG PO TABS
50.0000 mg | ORAL_TABLET | Freq: Every day | ORAL | Status: DC
  Administered 2012-11-27: 50 mg via ORAL

## 2012-11-26 MED ORDER — ONDANSETRON HCL 4 MG PO TABS: 4.0000 mg | ORAL_TABLET | Freq: Three times a day (TID) | ORAL | Status: DC | PRN

## 2012-11-26 MED ORDER — METOPROLOL TARTRATE 25 MG PO TABS
50.0000 mg | ORAL_TABLET | Freq: Two times a day (BID) | ORAL | Status: DC
  Administered 2012-11-27: 50 mg via ORAL
  Filled 2012-11-26: qty 2

## 2012-11-26 MED ORDER — ACETAMINOPHEN 325 MG PO TABS: 650.0000 mg | ORAL_TABLET | ORAL | Status: DC | PRN

## 2012-11-26 NOTE — ED Notes (Signed)
Patient here for detox, says she drank three 40 oz. Beers today as well as a regular size beer.  Also reports she lost her house and is homeless.  Reports feeling suicidal and has a plan to overdose on pills and alcohol.

## 2012-11-26 NOTE — ED Provider Notes (Signed)
History    This chart was scribed for non-physician practitioner working with [name]. Oletta Lamas, MD by Sofie Rower, ED Scribe. This patient was seen in room [facility] and the patient's care was started at 9:46PM.   CSN: [id number]  Arrival date & time 11/26/12  2034   First MD Initiated Contact with Patient 11/26/12 2146      Chief Complaint  Patient presents with  . Medical Clearance    (Consider location/radiation/quality/duration/timing/severity/associated sxs/prior treatment) Patient is a 57 y.o. female presenting with alcohol problem. The history is provided by the patient. No language interpreter was used.  Alcohol Problem This is a chronic problem. The current episode started more than 1 week ago. The problem occurs daily. The problem has been gradually worsening. Nothing aggravates the symptoms. Nothing relieves the symptoms. She has tried nothing for the symptoms. The treatment provided no relief.    Anita Russell is a 57 y.o. female , with a hx of depression, who presents to the Emergency Department complaining of sudden, progressively worsening, medical clearance, onset today (11/26/12) Associated symptoms include depression and diarrhea (characterized as watery). The pt reports she is homeless, depressed, and has been recently drinking too much alcohol. Furthermore, the pt informs she has recently lost her home, as of October, 2013, at which time, she informs, her drinking become out of control. The last alcoholic beverage the pt consumed was immediately before checking in at [facility] this evening (11/26/12). On a typical day, the pt informs she consumes 3, 40 once beers (120 ounces total). The pt reports she has been taking Zoloft, however, she has missed her scheduled medication dosage for the past two weeks.   Importantly, the pt reports she does indeed posses suicidal ideation, with a plan of overdose on pain medication and alcohol.   The pt denies experiencing any episodes of  hematochezia.   The pt is a current everyday smoker.   PCP is Dr. Kathe Mariner   Past Medical History  Diagnosis Date  . Gout   . Hypertension   . Thyroid disease   . Mental disorder   . Depression   . Asthma     Past Surgical History  Procedure Laterality Date  . Appendectomy    . Abdominal hysterectomy      No family history on file.  History  Substance Use Topics  . Smoking status: Current Every Day Smoker -- 1.00 packs/day    Types: Cigarettes  . Smokeless tobacco: Not on file  . Alcohol Use: No    OB History   Grav Para Term Preterm Abortions TAB SAB Ect Mult Living                  Review of Systems  Psychiatric/Behavioral: Positive for suicidal ideas and dysphoric mood.  All other systems reviewed and are negative.    Allergies  Review of patient's allergies indicates no known allergies.  Home Medications   Current Outpatient Rx  Name  Route  Sig  Dispense  Refill  . albuterol (PROVENTIL HFA;VENTOLIN HFA) 108 (90 BASE) MCG/ACT inhaler   Inhalation   Inhale 2 puffs into the lungs every 6 (six) hours as needed. For shortness of breath         . clonazePAM (KLONOPIN) 0.5 MG tablet   Oral   Take 0.5 mg by mouth 2 (two) times daily as needed for anxiety.         Marland Kitchen levothyroxine (SYNTHROID, LEVOTHROID) 100 MCG tablet   Oral   Take  1 tablet (100 mcg total) by mouth daily. For thyroid disease.         . metoprolol (LOPRESSOR) 50 MG tablet   Oral   Take 1 tablet (50 mg total) by mouth 2 (two) times daily. For hypertension.         . sertraline (ZOLOFT) 50 MG tablet   Oral   Take 50 mg by mouth daily. For anxiety and depression.         . traMADol (ULTRAM) 50 MG tablet   Oral   Take 50 mg by mouth every 6 (six) hours as needed for pain.            BP 114/75  Pulse 96  Temp(Src) 98.4 F (36.9 C) (Oral)  Resp 18  SpO2 93%  Physical Exam  Nursing note and vitals reviewed. Constitutional: She is oriented to person, place, and time.  She appears well-developed and well-nourished. No distress.  Disheveled appearance  HENT:  Head: Normocephalic and atraumatic.  Right Ear: External ear normal.  Left Ear: External ear normal.  Nose: Nose normal.  Mouth/Throat: Oropharynx is clear and moist.  Eyes: Conjunctivae and lids are normal. Pupils are equal, round, and reactive to light. Right eye exhibits nystagmus. Left eye exhibits nystagmus.  Neck: Normal range of motion.  Cardiovascular: Normal heart sounds, intact distal pulses and normal pulses.  Tachycardia present.   Pulmonary/Chest: Effort normal and breath sounds normal. No stridor. No respiratory distress. She has no wheezes. She has no rales.  Abdominal: Soft. She exhibits no distension. There is no tenderness.  Musculoskeletal: Normal range of motion.  Neurological: She is alert and oriented to person, place, and time. She has normal strength. No sensory deficit. She exhibits normal muscle tone.  Skin: Skin is warm and dry. She is not diaphoretic. No erythema.  Psychiatric: She has a normal mood and affect. Her behavior is normal. Her speech is slurred.    ED Course  Procedures (including critical care time)  DIAGNOSTIC STUDIES: Oxygen Saturation is 93% on room air, normal by my interpretation.    COORDINATION OF CARE:   9:59 PM- Treatment plan discussed with patient. Pt agrees with treatment.   Results for orders placed during the hospital encounter of 11/26/12  CBC WITH DIFFERENTIAL      Result Value Range   WBC 5.2  4.0 - 10.5 K/uL   RBC 5.13 (*) 3.87 - 5.11 MIL/uL   Hemoglobin 15.3 (*) 12.0 - 15.0 g/dL   HCT 86.5  78.4 - 69.6 %   MCV 87.3  78.0 - 100.0 fL   MCH 29.8  26.0 - 34.0 pg   MCHC 34.2  30.0 - 36.0 g/dL   RDW 29.5  28.4 - 13.2 %   Platelets 217  150 - 400 K/uL   Neutrophils Relative 41 (*) 43 - 77 %   Neutro Abs 2.1  1.7 - 7.7 K/uL   Lymphocytes Relative 50 (*) 12 - 46 %   Lymphs Abs 2.6  0.7 - 4.0 K/uL   Monocytes Relative 5  3 - 12 %    Monocytes Absolute 0.3  0.1 - 1.0 K/uL   Eosinophils Relative 4  0 - 5 %   Eosinophils Absolute 0.2  0.0 - 0.7 K/uL   Basophils Relative 0  0 - 1 %   Basophils Absolute 0.0  0.0 - 0.1 K/uL  BASIC METABOLIC PANEL      Result Value Range   Sodium 143  135 - 145 mEq/L  Potassium 3.9  3.5 - 5.1 mEq/L   Chloride 106  96 - 112 mEq/L   CO2 23  19 - 32 mEq/L   Glucose, Bld 98  70 - 99 mg/dL   BUN 9  6 - 23 mg/dL   Creatinine, Ser 1.61  0.50 - 1.10 mg/dL   Calcium 9.0  8.4 - 09.6 mg/dL   GFR calc non Af Amer >90  >90 mL/min   GFR calc Af Amer >90  >90 mL/min  ETHANOL      Result Value Range   Alcohol, Ethyl (B) 197 (*) 0 - 11 mg/dL  URINE RAPID DRUG SCREEN (HOSP PERFORMED)      Result Value Range   Opiates NONE DETECTED  NONE DETECTED   Cocaine NONE DETECTED  NONE DETECTED   Benzodiazepines NONE DETECTED  NONE DETECTED   Amphetamines NONE DETECTED  NONE DETECTED   Tetrahydrocannabinol NONE DETECTED  NONE DETECTED   Barbiturates NONE DETECTED  NONE DETECTED     Date: 11/27/2012  Rate: 118  Rhythm: sinus tachycardia  QRS Axis: normal  Intervals: normal  ST/T Wave abnormalities: normal  Conduction Disutrbances:none  Narrative Interpretation:   Old EKG Reviewed: unchanged    No results found.   1. Alcohol abuse   2. Depression with suicidal ideation       MDM  This is a 57 year old woman who presents today for alcohol detox. She drank 3 40 oz containers of beer and her last drink was one minute before he walked in the doors this emergency department. She appeared intoxicated during the interview. Her blood alcohol was 197. She complains of a history of depression worsening since she lost her house in November. She states there was a 2 week time period where she was not taking her Zoloft. She thinks this hurt her. She has SI and a plan to take pills and drink as much as possible. She gets diarrhea every time she drinks.     I personally performed the services described in  this documentation, which was scribed in my presence. The recorded information has been reviewed and is accurate.   Mora Bellman, PA-C 11/27/12 [phone]

## 2012-11-27 ENCOUNTER — Inpatient Hospital Stay (HOSPITAL_COMMUNITY)
Admission: EM | Admit: 2012-11-27 | Discharge: 2012-11-30 | DRG: 897 | Disposition: A | Discharge status: Home / Self Care | Payer: No Typology Code available for payment source | Source: Intra-hospital | Attending: Psychiatry | Admitting: Psychiatry

## 2012-11-27 ENCOUNTER — Encounter (HOSPITAL_COMMUNITY): Payer: Self-pay | Admitting: Intensive Care

## 2012-11-27 DIAGNOSIS — J45909 Unspecified asthma, uncomplicated: Secondary | ICD-10-CM | POA: Diagnosis present

## 2012-11-27 DIAGNOSIS — F419 Anxiety disorder, unspecified: Secondary | ICD-10-CM

## 2012-11-27 DIAGNOSIS — M109 Gout, unspecified: Secondary | ICD-10-CM

## 2012-11-27 DIAGNOSIS — Z79899 Other long term (current) drug therapy: Secondary | ICD-10-CM

## 2012-11-27 DIAGNOSIS — E039 Hypothyroidism, unspecified: Secondary | ICD-10-CM | POA: Diagnosis present

## 2012-11-27 DIAGNOSIS — F1010 Alcohol abuse, uncomplicated: Secondary | ICD-10-CM

## 2012-11-27 DIAGNOSIS — I10 Essential (primary) hypertension: Secondary | ICD-10-CM | POA: Diagnosis present

## 2012-11-27 DIAGNOSIS — F1020 Alcohol dependence, uncomplicated: Principal | ICD-10-CM | POA: Diagnosis present

## 2012-11-27 DIAGNOSIS — R45851 Suicidal ideations: Secondary | ICD-10-CM

## 2012-11-27 DIAGNOSIS — F411 Generalized anxiety disorder: Secondary | ICD-10-CM | POA: Diagnosis present

## 2012-11-27 DIAGNOSIS — F329 Major depressive disorder, single episode, unspecified: Secondary | ICD-10-CM | POA: Diagnosis present

## 2012-11-27 HISTORY — DX: Hypothyroidism, unspecified: E03.9

## 2012-11-27 MED ORDER — ACETAMINOPHEN 325 MG PO TABS: 650.0000 mg | ORAL_TABLET | Freq: Four times a day (QID) | ORAL | Status: DC | PRN

## 2012-11-27 MED ORDER — METOPROLOL TARTRATE 25 MG PO TABS: 100.0000 mg | ORAL_TABLET | Freq: Two times a day (BID) | ORAL | Status: DC

## 2012-11-27 MED ORDER — LEVOTHYROXINE SODIUM 100 MCG PO TABS
100.0000 ug | ORAL_TABLET | Freq: Every day | ORAL | Status: DC
  Administered 2012-11-28 – 2012-11-30 (×3): 100 ug via ORAL
  Filled 2012-11-27 (×4): qty 1

## 2012-11-27 MED ORDER — HYDROXYZINE HCL 25 MG PO TABS
25.0000 mg | ORAL_TABLET | Freq: Four times a day (QID) | ORAL | Status: AC | PRN
  Filled 2012-11-27: qty 10

## 2012-11-27 MED ORDER — SERTRALINE HCL 50 MG PO TABS
50.0000 mg | ORAL_TABLET | Freq: Every day | ORAL | Status: DC
  Administered 2012-11-28 – 2012-11-30 (×3): 50 mg via ORAL
  Filled 2012-11-27 (×6): qty 1

## 2012-11-27 MED ORDER — METOPROLOL TARTRATE 50 MG PO TABS
50.0000 mg | ORAL_TABLET | Freq: Two times a day (BID) | ORAL | Status: DC
  Administered 2012-11-27 – 2012-11-30 (×6): 50 mg via ORAL
  Filled 2012-11-27 (×8): qty 1

## 2012-11-27 MED ORDER — CHLORDIAZEPOXIDE HCL 25 MG PO CAPS
25.0000 mg | ORAL_CAPSULE | Freq: Four times a day (QID) | ORAL | Status: AC | PRN
  Administered 2012-11-28 (×2): 25 mg via ORAL
  Filled 2012-11-27 (×2): qty 1

## 2012-11-27 MED ORDER — NICOTINE 21 MG/24HR TD PT24
21.0000 mg | MEDICATED_PATCH | Freq: Every day | TRANSDERMAL | Status: DC
  Administered 2012-11-28 – 2012-11-30 (×3): 21 mg via TRANSDERMAL
  Filled 2012-11-27 (×5): qty 1

## 2012-11-27 MED ORDER — MAGNESIUM HYDROXIDE 400 MG/5ML PO SUSP: 30.0000 mL | Freq: Every day | ORAL | Status: DC | PRN

## 2012-11-27 MED ORDER — ADULT MULTIVITAMIN W/MINERALS CH
1.0000 | ORAL_TABLET | Freq: Every day | ORAL | Status: DC
  Administered 2012-11-27 – 2012-11-30 (×4): 1 via ORAL
  Filled 2012-11-27 (×5): qty 1

## 2012-11-27 MED ORDER — ONDANSETRON 4 MG PO TBDP: 4.0000 mg | ORAL_TABLET | Freq: Four times a day (QID) | ORAL | Status: AC | PRN

## 2012-11-27 MED ORDER — CHLORDIAZEPOXIDE HCL 25 MG PO CAPS
25.0000 mg | ORAL_CAPSULE | Freq: Every day | ORAL | Status: DC
  Filled 2012-11-27: qty 1

## 2012-11-27 MED ORDER — ALUM & MAG HYDROXIDE-SIMETH 200-200-20 MG/5ML PO SUSP
30.0000 mL | ORAL | Status: DC | PRN
  Administered 2012-11-28: 30 mL via ORAL

## 2012-11-27 MED ORDER — TRAZODONE HCL 100 MG PO TABS
100.0000 mg | ORAL_TABLET | Freq: Every evening | ORAL | Status: DC | PRN
  Administered 2012-11-27 – 2012-11-29 (×3): 100 mg via ORAL
  Filled 2012-11-27 (×9): qty 1

## 2012-11-27 MED ORDER — METOPROLOL TARTRATE 25 MG PO TABS: 50.0000 mg | ORAL_TABLET | Freq: Two times a day (BID) | ORAL | Status: DC

## 2012-11-27 MED ORDER — THIAMINE HCL 100 MG/ML IJ SOLN
100.0000 mg | Freq: Once | INTRAMUSCULAR | Status: AC
  Administered 2012-11-27: 100 mg via INTRAMUSCULAR

## 2012-11-27 MED ORDER — CHLORDIAZEPOXIDE HCL 25 MG PO CAPS
25.0000 mg | ORAL_CAPSULE | ORAL | Status: DC
  Administered 2012-11-30: 25 mg via ORAL

## 2012-11-27 MED ORDER — VITAMIN B-1 100 MG PO TABS
100.0000 mg | ORAL_TABLET | Freq: Every day | ORAL | Status: DC
  Administered 2012-11-28 – 2012-11-30 (×3): 100 mg via ORAL
  Filled 2012-11-27 (×4): qty 1

## 2012-11-27 MED ORDER — CHLORDIAZEPOXIDE HCL 25 MG PO CAPS
25.0000 mg | ORAL_CAPSULE | Freq: Four times a day (QID) | ORAL | Status: AC
  Administered 2012-11-27 – 2012-11-28 (×6): 25 mg via ORAL
  Filled 2012-11-27 (×6): qty 1

## 2012-11-27 MED ORDER — LOPERAMIDE HCL 2 MG PO CAPS: 2.0000 mg | ORAL_CAPSULE | ORAL | Status: AC | PRN

## 2012-11-27 MED ORDER — CHLORDIAZEPOXIDE HCL 25 MG PO CAPS
25.0000 mg | ORAL_CAPSULE | Freq: Three times a day (TID) | ORAL | Status: AC
  Administered 2012-11-29 (×3): 25 mg via ORAL
  Filled 2012-11-27 (×3): qty 1

## 2012-11-27 MED ORDER — METOPROLOL TARTRATE 25 MG PO TABS
50.0000 mg | ORAL_TABLET | Freq: Once | ORAL | Status: AC
  Administered 2012-11-27: 50 mg via ORAL
  Filled 2012-11-27: qty 2

## 2012-11-27 NOTE — Progress Notes (Signed)
Patient admitted to [facility] from [facility]. Patient states that she is here for "alcohol detox" and that she would like to be released by Monday, the 7th (11/30/12) in order to attend an [facility] appointment. The patient reports drinking "daily" and states that she would like to detox "once and for all." The patient currently denies any SI/HI and auditory and visual hallucinations. The patient was oriented to the unit and given education regarding unit policies and procedures. Patient currently denies any questions or concerns. Will continue to monitor patient for safety.

## 2012-11-27 NOTE — [facility] Group Notes (Signed)
[facility] LCSW Group Therapy  11/27/2012 1:15  Type of Therapy:  Group Therapy 1:15 to 2:30  Participation Level:  Active  Participation Quality:  Appropriate, Attentive and Sharing  Affect:  Appropriate and Depressed  Cognitive:  Alert and Oriented  Insight:  Developing/Improving and Engaged  Engagement in Therapy:  Engaged  Modes of Intervention:  Clarification, Discussion, Exploration, Orientation and Reality Testing  Summary of Progress/Problems: Focus of group processing discussion was on balance in life; the components in life which have a negative influence on balance and the components which make for a more balanced life.  Patient shared that "the most balanced I have ever felt was when I was able to keep active in three aspects of my life including work, daughter and social.  Now since I am homeless nothing ever seems balanced, the least little thing can and often does through me off." Patient was out of the group room to meet with the doctor for much of the group time.   Clide Dales

## 2012-11-27 NOTE — [facility] Counselor (Signed)
Patient accepted at behavioral health. By Dr. Jorje Guild, PA to Dr. Dub Mikes. Patients bed assignment is 300-1. EDP-Dr. Rubin Payor notified of patients disposition. Patients nurse-Tina also notified. The call report # is [phone]. All support paperwork completed and faxed to [facility].

## 2012-11-27 NOTE — [facility] Suicide Risk Assessment (Signed)
[facility] INPATIENT:  Family/Significant Other Suicide Prevention Education  Suicide Prevention Education:  Patient Refusal for Family/Significant Other Suicide Prevention Education: The patient Anita Russell has refused to provide written consent for family/significant other to be provided Family/Significant Other Suicide Prevention Education during admission and/or prior to discharge.  Physician notified. Writer provided suicide prevention education directly to patient; conversation included risk factors, warning signs and resources to contact for help. Mobile crisis services explained and contact card placed in chart for pt to receive at discharge.  Clide Dales 11/27/2012, 5:58 PM

## 2012-11-27 NOTE — [facility] Counselor (Signed)
Patient reviewed by Jorje Guild, PA and accepted to Dr. Dub Mikes at [facility] and will be going into 300-1.

## 2012-11-27 NOTE — ED Notes (Signed)
Patient transported to [facility] by [facility] security, escorted by [facility]. report called to [employer].

## 2012-11-27 NOTE — ED Provider Notes (Signed)
Patient reportedly is accepted at behavioral health. Dr. Dub Mikes excepting. Reportedly can go after 8 AM.  Anita Russell. Rubin Payor, MD 11/27/12 [phone]

## 2012-11-27 NOTE — Tx Team (Signed)
Interdisciplinary Treatment Plan Update (Adult)  Date: 11/27/2012  Time Reviewed: 6:49 PM   Progress in Treatment: Attending groups: Yes Participating in groups: Yes Taking medication as prescribed:  Yes Tolerating medication:  Yes Family/Significant othe contact made: No; patient refuses Patient understands diagnosis: Yes Discussing patient identified problems/goals with staff: Yes Medical problems stabilized or resolved:  Yes Denies suicidal/homicidal ideation: Yes Patient has not harmed self or Others: Yes  New problem(s) identified: None Identified  Discharge Plan or Barriers:  Patient will [facility] to [facility] and follow up at [facility] Monday at 9 AM  Additional comments: N/A  Reason for Continuation of Hospitalization: Depression Withdrawal symptoms   Estimated length of stay: 2-3 days; Discharge Sunday  For review of initial/current patient goals, please see plan of care.  Attendees: Patient:     Family:     Physician:  Geoffery Lyons 11/27/2012 5:00 PM   Nursing:      Clinical Social Worker Ronda Fairly 11/27/2012 5:00 PM   Other:     Other:     Other:     Other:      Scribe for Treatment Team:   Carney Bern, LCSWA  11/27/2012 5:00 PM

## 2012-11-27 NOTE — ED Provider Notes (Signed)
Medical screening examination/treatment/procedure(s) were performed by non-physician practitioner and as supervising physician I was immediately available for consultation/collaboration.   [name]. Oletta Lamas, MD 11/27/12 [phone]

## 2012-11-27 NOTE — BH Assessment (Signed)
Assessment Note   Anita Russell is an 57 y.o. female. Patient presents to [facility] suicidal with a plan to overdose on pills and alcohol also seeking detox from alcohol.  Patient states that she lost her home in October of last year after losing her job in July of last year. She went through detox at [facility] at that time and from there went to a 90 day residential treatment program at [facility] --> [facility] which the patient states "sucked". Patient stated that the living conditions were poor and there was no individual counseling provided. She relapsed in March ( after having 111 days of sobriety) because she had no where to go, she has been living in her car and "drinking constantly" to sleep and just to cope with life.  She endorses hopelessness, helplessness, fatigue, guilt, isolation, and loss of interest in usual pleasures. Patient states that she can't even shower or dress presentably to even look for a job. Patient has a 33 yo daughter that she states that she doesn't want to burden with her problems which increases her depression. Patient has a history of overdosing on pills (she had gather from friends)  and alcohol in 2012. Patient is in need of inpatient crisis stabilization.  Axis I: Major Depression, Recurrent severe and Alcohol Dependence Axis II: Deferred Axis III:  Past Medical History  Diagnosis Date  . Gout   . Hypertension   . Thyroid disease   . Mental disorder   . Depression   . Asthma    Axis IV: economic problems, housing problems, occupational problems, other psychosocial or environmental problems, problems related to social environment and problems with primary support group Axis V: 35  Past Medical History:  Past Medical History  Diagnosis Date  . Gout   . Hypertension   . Thyroid disease   . Mental disorder   . Depression   . Asthma     Past Surgical History  Procedure Laterality Date  . Appendectomy    . Abdominal hysterectomy       Family History: No family history on file.  Social History:  reports that she has been smoking Cigarettes.  She has been smoking about 1.00 pack per day. She does not have any smokeless tobacco history on file. She reports that she does not drink alcohol or use illicit drugs.  Additional Social History:  Alcohol / Drug Use History of alcohol / drug use?: Yes Substance #1 Name of Substance 1: Beer  1 - Age of First Use: 15 1 - Amount (size/oz): 2-3 (40oz) beers 1 - Frequency: Daily 1 - Duration: 1 month 1 - Last Use / Amount: 11/27/11; (3) 40oz beers and 1 regular size beer  CIWA: CIWA-Ar BP: 90/53 mmHg Pulse Rate: 98 Nausea and Vomiting: no nausea and no vomiting Tactile Disturbances: none Tremor: no tremor Auditory Disturbances: not present Paroxysmal Sweats: no sweat visible Visual Disturbances: not present Anxiety: no anxiety, at ease Headache, Fullness in Head: none present Agitation: normal activity Orientation and Clouding of Sensorium: oriented and can do serial additions CIWA-Ar Total: 0 COWS:    Allergies: No Known Allergies  Home Medications:  (Not in a hospital admission)  OB/GYN Status:  No LMP recorded. Patient has had a hysterectomy.  General Assessment Data Location of Assessment: [facility] Living Arrangements: Other (Comment) (Homeless) Can pt return to current living arrangement?: Yes Admission Status: Voluntary Is patient capable of signing voluntary admission?: Yes Transfer from: Other (Comment) Surveyor, minerals) Referral  Source: Self/Family/Friend  Education Status Is patient currently in school?: No Contact person:  Camera operator [phone])  Risk to self Suicidal Ideation: Yes-Currently Present Suicidal Intent: Yes-Currently Present Is patient at risk for suicide?: Yes Suicidal Plan?: Yes-Currently Present Specify Current Suicidal Plan:  (overdose on pills and alcohol) Access to Means: Yes Specify Access to Suicidal Means:  (pills and  alcohol) What has been your use of drugs/alcohol within the last 12 months?:  (Daily/ "constant") Previous Attempts/Gestures: Yes How many times?:  (1x) Other Self Harm Risks:  (none noted) Triggers for Past Attempts: Family contact;Other (Comment) (financial issues and stress at work) Adult nurse Injurious Behavior: None Family Suicide History: Yes (Brother hung himself, mother/ depression) Recent stressful life event(s): Financial Problems;Job Loss;Other (Comment) (Homeless) Persecutory voices/beliefs?: No Depression: Yes Depression Symptoms: Despondent;Insomnia;Isolating;Loss of interest in usual pleasures;Feeling worthless/self pity Substance abuse history and/or treatment for substance abuse?: Yes Suicide prevention information given to non-admitted patients: Not applicable  Risk to Others Homicidal Ideation: No Thoughts of Harm to Others: No Current Homicidal Intent: No Current Homicidal Plan: No Access to Homicidal Means: No Identified Victim:  (Na) History of harm to others?: No Assessment of Violence: None Noted Violent Behavior Description:  (Na) Does patient have access to weapons?: No Criminal Charges Pending?: No Does patient have a court date: No  Psychosis Hallucinations: None noted Delusions: None noted  Mental Status Report Appear/Hygiene: Disheveled;Poor hygiene Eye Contact: Fair Motor Activity: Freedom of movement;Psychomotor retardation;Unremarkable Speech: Logical/coherent Level of Consciousness: Quiet/awake Mood: Depressed;Ashamed/humiliated;Helpless Affect: Appropriate to circumstance;Depressed Anxiety Level: None Thought Processes: Coherent;Relevant Judgement: Impaired Orientation: Person;Place;Time;Situation Obsessive Compulsive Thoughts/Behaviors: None  Cognitive Functioning Concentration: Decreased Memory: Recent Intact;Remote Intact IQ: Average Insight: Poor Impulse Control: Poor Appetite: Poor Weight Loss:  (None noted) Weight  Gain:  (None noted) Sleep: Decreased Total Hours of Sleep:  (drinks to sleep) Vegetative Symptoms: None  ADLScreening [facility] Assessment Services) Patient's cognitive ability adequate to safely complete daily activities?: Yes Patient able to express need for assistance with ADLs?: Yes Independently performs ADLs?: Yes (appropriate for developmental age)  Abuse/Neglect [facility]) Physical Abuse: Denies Verbal Abuse: Denies Sexual Abuse: Denies  Prior Inpatient Therapy Prior Inpatient Therapy: Yes Prior Therapy Dates:  (11/2010 & 10/13) Prior Therapy Facilty/Provider(s):  [facility]) Reason for Treatment: Suicide attempt/ Detox  Prior Outpatient Therapy Prior Outpatient Therapy: Yes Prior Therapy Dates: 05/2012 (05/2012) Prior Therapy Facilty/Provider(s):  ([facility]/ 90 days) Reason for Treatment:  (Rehabilitation)  ADL Screening (condition at time of admission) Patient's cognitive ability adequate to safely complete daily activities?: Yes Patient able to express need for assistance with ADLs?: Yes Independently performs ADLs?: Yes (appropriate for developmental age) Weakness of Legs: None Weakness of Arms/Hands: None       Abuse/Neglect Assessment (Assessment to be complete while patient is alone) Physical Abuse: Denies Verbal Abuse: Denies Sexual Abuse: Denies Exploitation of patient/patient's resources: Denies Self-Neglect: Denies Values / Beliefs Cultural Requests During Hospitalization: None Spiritual Requests During Hospitalization: Hospital staff spiritual visit   Advance Directives (For Healthcare) Advance Directive: Patient does not have advance directive;Patient would like information    Additional Information 1:1 In Past 12 Months?: No CIRT Risk: No Elopement Risk: No Does patient have medical clearance?: Yes     Disposition:  Disposition Initial Assessment Completed for this Encounter: Yes Disposition of Patient: Inpatient treatment program Type of  inpatient treatment program: Adult  On Site Evaluation by:   Reviewed with Physician:     Rudi Coco 11/27/2012 2:40 AM

## 2012-11-27 NOTE — BHH Suicide Risk Assessment (Signed)
Suicide Risk Assessment  Admission Assessment     Nursing information obtained from:    Demographic factors:    Current Mental Status:    Loss Factors:    Historical Factors:    Risk Reduction Factors:     CLINICAL FACTORS:   Depression:   Comorbid alcohol abuse/dependence Alcohol/Substance Abuse/Dependencies  COGNITIVE FEATURES THAT CONTRIBUTE TO RISK:  Thought constriction (tunnel vision)    SUICIDE RISK:   Moderate:  Frequent suicidal ideation with limited intensity, and duration, some specificity in terms of plans, no associated intent, good self-control, limited dysphoria/symptomatology, some risk factors present, and identifiable protective factors, including available and accessible social support.  PLAN OF CARE: Supportive approach/coping skills/relapse prevention                               Librium detox protocol                               Resume the Zoloft up to 50 mg daily  I certify that inpatient services furnished can reasonably be expected to improve the patient's condition.  Thera Basden A 11/27/2012, 4:34 PM

## 2012-11-27 NOTE — Tx Team (Signed)
Initial Interdisciplinary Treatment Plan  PATIENT STRENGTHS: (choose at least two) Ability for insight Active sense of humor Average or above average intelligence Communication skills  PATIENT STRESSORS: Financial difficulties Health problems Medication change or noncompliance Occupational concerns Substance abuse   PROBLEM LIST: Problem List/Patient Goals Date to be addressed Date deferred Reason deferred Estimated date of resolution  ETOH Abuse 11/27/12                                                      DISCHARGE CRITERIA:  Ability to meet basic life and health needs Adequate post-discharge living arrangements Improved stabilization in mood, thinking, and/or behavior Medical problems require only outpatient monitoring  PRELIMINARY DISCHARGE PLAN: Attend aftercare/continuing care group Attend 12-step recovery group Outpatient therapy  PATIENT/FAMIILY INVOLVEMENT: This treatment plan has been presented to and reviewed with the patient, [employer].  The patient has been given the opportunity to ask questions and make suggestions.  Para March, Joenathan Sakuma MCCOLLUM 11/27/2012, 11:22 AM

## 2012-11-27 NOTE — H&P (Signed)
Psychiatric Admission Assessment Adult  Patient Identification:  Anita Russell Date of Evaluation:  11/27/2012 Chief Complaint:  MDD ETOH Dependence  History of Present Illness:: She was here in October 2013. After she left here she went to [facility] then to [facility]. Was let go with no follow up. She contacted someone she met at [facility] who told her about [facility]. She started going to [facility], they were planning to help with resources. She relapsed the first week of March. She was living out of her car. Got in a relationship that did not work out. She had taken off with that person. Came back yesterday started drinking, felt suicidal. Came for help.  Elements:  Location:  in patient. Quality:  unable to function. Severity:  severe. Timing:  every day. Duration:  getting worst. Context:  alcohol depenent with increase in her depression and anxiety, with SI. Associated Signs/Synptoms: Depression Symptoms:  depressed mood, anhedonia, fatigue, feelings of worthlessness/guilt, difficulty concentrating, suicidal thoughts without plan, anxiety, insomnia, loss of energy/fatigue, disturbed sleep, weight loss, decreased appetite, (Hypo) Manic Symptoms:  Denies Anxiety Symptoms:  Excessive Worry, Panic Symptoms, Psychotic Symptoms:  Denies PTSD Symptoms: NA  Psychiatric Specialty Exam: Physical Exam  ROS  Blood pressure 153/96, pulse 68, temperature 98.1 F (36.7 C), temperature source Oral, resp. rate 18, height 5\' 8"  (1.727 m), weight 75.751 kg (167 lb), SpO2 97.00%.Body mass index is 25.4 kg/(m^2).  General Appearance: Disheveled  Eye Solicitor::  Fair  Speech:  Clear and Coherent and Slow  Volume:  Decreased  Mood:  Anxious and Depressed  Affect:  Depressed  Thought Process:  Coherent and Goal Directed  Orientation:  Full (Time, Place, and Person)  Thought Content:  Rumination and worries, concerns  Suicidal Thoughts:  Yes.  without intent/plan  Homicidal Thoughts:  No   Memory:  Immediate;   Fair Recent;   Fair Remote;   Fair  Judgement:  Fair  Insight:  Present  Psychomotor Activity:  Restlessness  Concentration:  Fair  Recall:  Fair  Akathisia:  No  Handed:  Right  AIMS (if indicated):     Assets:  Desire for Improvement  Sleep:       Past Psychiatric History: Diagnosis: Alcohol Dependence, Depression, Anxiety  [facility]ospitalizations: [facility]  Outpatient Care: [facility]  [facility]stance Abuse Care: [facility], [facility]  Self-Mutilation: Denies  Suicidal Attempts: April 2012  Violent Behaviors: Denies   Past Medical History:   Past Medical History  Diagnosis Date  . Gout   . Hypertension   . Thyroid disease   . Mental disorder   . Depression   . Asthma   . Hypothyroidism     Allergies:  No Known Allergies PTA Medications: Prescriptions prior to admission  Medication Sig Dispense Refill  . albuterol (PROVENTIL HFA;VENTOLIN HFA) 108 (90 BASE) MCG/ACT inhaler Inhale 2 puffs into the lungs every 6 (six) hours as needed. For shortness of breath      . clonazePAM (KLONOPIN) 0.5 MG tablet Take 0.5 mg by mouth 2 (two) times daily as needed for anxiety.      Marland Kitchen levothyroxine (SYNTHROID, LEVOTHROID) 100 MCG tablet Take 1 tablet (100 mcg total) by mouth daily. For thyroid disease.      . metoprolol (LOPRESSOR) 50 MG tablet Take 1 tablet (50 mg total) by mouth 2 (two) times daily. For hypertension.      . sertraline (ZOLOFT) 50 MG tablet Take 50 mg by mouth daily. For anxiety and depression.      . traMADol Janean Sark)  50 MG tablet Take 50 mg by mouth every 6 (six) hours as needed for pain.         Previous Psychotropic Medications:  Medication/Dose  Zoloft               Substance Abuse History in the last 12 months:  yes  Consequences of Substance Abuse: Legal Consequences:  DWI Blackouts:   Withdrawal Symptoms:   Nausea  Social History:  reports that she has been smoking Cigarettes.  She has been smoking about 1.00 pack per day. She does not  have any smokeless tobacco history on file. She reports that she does not drink alcohol or use illicit drugs. Additional Social History:                      Current Place of Residence:  Armed forces logistics/support/administrative officer of Birth:   Family Members: Marital Status:  Single Children:  Sons:  Daughters: 21 Relationships: Education:  HS Print production planner Problems/Performance: Religious Beliefs/Practices: History of Abuse (Emotional/Phsycial/Sexual) Armed forces technical officer; Office work, Musician work Hotel manager History:  None. Legal History: Hobbies/Interests:  Family History:  History reviewed. No pertinent family history./ Alcohol, Addiction, Mental illness  Results for orders placed during the hospital encounter of 11/26/12 (from the past 72 hour(s))  URINE RAPID DRUG SCREEN (HOSP PERFORMED)     Status: None   Collection Time    11/26/12  9:42 PM      Result Value Range   Opiates NONE DETECTED  NONE DETECTED   Cocaine NONE DETECTED  NONE DETECTED   Benzodiazepines NONE DETECTED  NONE DETECTED   Amphetamines NONE DETECTED  NONE DETECTED   Tetrahydrocannabinol NONE DETECTED  NONE DETECTED   Barbiturates NONE DETECTED  NONE DETECTED   Comment:            DRUG SCREEN FOR MEDICAL PURPOSES     ONLY.  IF CONFIRMATION IS NEEDED     FOR ANY PURPOSE, NOTIFY LAB     WITHIN 5 DAYS.                LOWEST DETECTABLE LIMITS     FOR URINE DRUG SCREEN     Drug Class       Cutoff (ng/mL)     Amphetamine      1000     Barbiturate      200     Benzodiazepine   200     Tricyclics       300     Opiates          300     Cocaine          300     THC              50  CBC WITH DIFFERENTIAL     Status: Abnormal   Collection Time    11/26/12 10:10 PM      Result Value Range   WBC 5.2  4.0 - 10.5 K/uL   RBC 5.13 (*) 3.87 - 5.11 MIL/uL   Hemoglobin 15.3 (*) 12.0 - 15.0 g/dL   HCT 08.6  57.8 - 46.9 %   MCV 87.3  78.0 - 100.0 fL   MCH 29.8  26.0 - 34.0 pg   MCHC 34.2  30.0 - 36.0 g/dL   RDW 62.9   52.8 - 41.3 %   Platelets 217  150 - 400 K/uL   Neutrophils Relative 41 (*) 43 - 77 %   Neutro Abs 2.1  1.7 - 7.7 K/uL   Lymphocytes Relative 50 (*) 12 - 46 %   Lymphs Abs 2.6  0.7 - 4.0 K/uL   Monocytes Relative 5  3 - 12 %   Monocytes Absolute 0.3  0.1 - 1.0 K/uL   Eosinophils Relative 4  0 - 5 %   Eosinophils Absolute 0.2  0.0 - 0.7 K/uL   Basophils Relative 0  0 - 1 %   Basophils Absolute 0.0  0.0 - 0.1 K/uL  BASIC METABOLIC PANEL     Status: None   Collection Time    11/26/12 10:10 PM      Result Value Range   Sodium 143  135 - 145 mEq/L   Potassium 3.9  3.5 - 5.1 mEq/L   Comment: SLIGHT HEMOLYSIS   Chloride 106  96 - 112 mEq/L   CO2 23  19 - 32 mEq/L   Glucose, Bld 98  70 - 99 mg/dL   BUN 9  6 - 23 mg/dL   Creatinine, Ser 1.47  0.50 - 1.10 mg/dL   Calcium 9.0  8.4 - 82.9 mg/dL   GFR calc non Af Amer >90  >90 mL/min   GFR calc Af Amer >90  >90 mL/min   Comment:            The eGFR has been calculated     using the CKD EPI equation.     This calculation has not been     validated in all clinical     situations.     eGFR's persistently     <90 mL/min signify     possible Chronic Kidney Disease.  ETHANOL     Status: Abnormal   Collection Time    11/26/12 10:10 PM      Result Value Range   Alcohol, Ethyl (B) 197 (*) 0 - 11 mg/dL   Comment:            LOWEST DETECTABLE LIMIT FOR     SERUM ALCOHOL IS 11 mg/dL     FOR MEDICAL PURPOSES ONLY   Psychological Evaluations:  Assessment:   AXIS I:  Alcohol Dependence, Anxiety Disorder, Major Depression AXIS II:  Deferred AXIS III:   Past Medical History  Diagnosis Date  . Gout   . Hypertension   . Thyroid disease   . Mental disorder   . Depression   . Asthma   . Hypothyroidism    AXIS IV:  economic problems, housing problems, occupational problems, other psychosocial or environmental problems and problems with primary support group AXIS V:  41-50 serious symptoms  Treatment Plan/Recommendations:  Supportive  approach/coping skills/relapse prevention                                                                  Librium Detox protocol Treatment Plan Summary: Daily contact with patient to assess and evaluate symptoms and progress in treatment Medication management Current Medications:  Current Facility-Administered Medications  Medication Dose Route Frequency Provider Last Rate Last Dose  . acetaminophen (TYLENOL) tablet 650 mg  650 mg Oral Q6H PRN Jorje Guild, PA-C      . alum & mag hydroxide-simeth (MAALOX/MYLANTA) 200-200-20 MG/5ML suspension 30 mL  30 mL Oral Q4H PRN Jorje Guild, PA-C      .  chlordiazePOXIDE (LIBRIUM) capsule 25 mg  25 mg Oral Q6H PRN Jorje Guild, PA-C      . chlordiazePOXIDE (LIBRIUM) capsule 25 mg  25 mg Oral QID Jorje Guild, PA-C   25 mg at 11/27/12 1150   Followed by  . [START ON 11/29/2012] chlordiazePOXIDE (LIBRIUM) capsule 25 mg  25 mg Oral TID Jorje Guild, PA-C       Followed by  . [START ON 11/30/2012] chlordiazePOXIDE (LIBRIUM) capsule 25 mg  25 mg Oral BH-qamhs Jorje Guild, PA-C       Followed by  . [START ON 12/01/2012] chlordiazePOXIDE (LIBRIUM) capsule 25 mg  25 mg Oral Daily Jorje Guild, PA-C      . hydrOXYzine (ATARAX/VISTARIL) tablet 25 mg  25 mg Oral Q6H PRN Jorje Guild, PA-C      . [START ON 11/28/2012] levothyroxine (SYNTHROID, LEVOTHROID) tablet 100 mcg  100 mcg Oral QAC breakfast Jorje Guild, PA-C      . loperamide (IMODIUM) capsule 2-4 mg  2-4 mg Oral PRN Jorje Guild, PA-C      . magnesium hydroxide (MILK OF MAGNESIA) suspension 30 mL  30 mL Oral Daily PRN Jorje Guild, PA-C      . metoprolol (LOPRESSOR) tablet 50 mg  50 mg Oral BID Jorje Guild, PA-C      . multivitamin with minerals tablet 1 tablet  1 tablet Oral Daily Jorje Guild, PA-C   1 tablet at 11/27/12 1149  . nicotine (NICODERM CQ - dosed in mg/24 hours) patch 21 mg  21 mg Transdermal Q0600 Jorje Guild, PA-C      . ondansetron (ZOFRAN-ODT) disintegrating tablet 4 mg  4 mg Oral Q6H PRN Jorje Guild, PA-C      . sertraline (ZOLOFT)  tablet 50 mg  50 mg Oral Daily Jorje Guild, PA-C      . [START ON 11/28/2012] thiamine (VITAMIN B-1) tablet 100 mg  100 mg Oral Daily Jorje Guild, PA-C        Observation Level/Precautions:  15 minute checks  Laboratory:  AS per the ED  Psychotherapy:  Individual/group  Medications:  Librium detox/resume the Zoloft  Consultations:    Discharge Concerns:    Estimated LOS: 3-5 days  Other:     I certify that inpatient services furnished can reasonably be expected to improve the patient's condition.   Wisdom Seybold A 4/4/20142:24 PM

## 2012-11-27 NOTE — BHH Counselor (Signed)
Adult Psychosocial Assessment Update Interdisciplinary Team  Previous [facility] admissions/discharges:  Admissions Discharges  Date: 10:21:13 Date: 10:25 38  Date: Date:  Date: Date:  Date: Date:  Date: Date:   Changes since the last Psychosocial Assessment (including adherence to outpatient mental health and/or substance abuse treatment, situational issues contributing to decompensation and/or relapse). Patient reports that she followed up at [facility] last October where she stayed for 90   Days.  [facility] but left in early March as she had relapsed. Did  Not feel it was a good fit for her there. Relapsed after 111 days sobriety, returned to   [place], travelled for a few weeks with another alcoholic and is seeking detox.       Discharge Plan 1. Will you be returning to the same living situation after discharge?   Yes: X No:      If no, what is your plan?    Will be staying with a friend       2. Would you like a referral for services when you are discharged? Yes:     If yes, for what services?  No:   X     Patient is already set up at [facility] for Monday at 9 AM       Summary and Recommendations (to be completed by the evaluator) Patient is 57 YO single unemployed homeless caucasian female admitted w diagnosis   Of Major depression, recurrent severe and Alcohol Dependence.  Patient will benefit from  Crisis stabilization, medication evaluation, group therapy and psycho education in   Addition to discharge planning.                  Signature:  Clide Dales, 11/27/2012 5:46 PM

## 2012-11-28 DIAGNOSIS — F329 Major depressive disorder, single episode, unspecified: Secondary | ICD-10-CM

## 2012-11-28 DIAGNOSIS — R45851 Suicidal ideations: Secondary | ICD-10-CM

## 2012-11-28 DIAGNOSIS — F1010 Alcohol abuse, uncomplicated: Secondary | ICD-10-CM

## 2012-11-28 NOTE — Progress Notes (Signed)
Patient ID: Anita Russell, female   DOB: [birth year], 57 y.o.   MRN: [id number] D)  Has been out of her room and in the dayroom this evening, attended group.  Afterward came to med window and stated she was still feeling anxious this evening, had another episode of feeling some tightness and chest pain during group, but it subsided while she was in group.  Still feeling anxious when she came to the window , intermittent sweating, nausea, requested and was given librium.  Still feeling some stress about being here on unit. A)  Will continue to monitor for safety, continue POC R)  Safety maintained.

## 2012-11-28 NOTE — BHH Group Notes (Signed)
B[facility] LCSW Group Therapy  11/28/2012  Type of Therapy:  Group Therapy - Motivational Interviewing  Participation Level:  Active  Participation Quality:  Appropriate, Attentive, Sharing and Supportive  Affect:  Blunted  Cognitive:  Appropriate  Insight:  Engaged  Engagement in Therapy:  Engaged  Modes of Intervention:  Exploration  Summary of Progress/Problems:  The main focus of today's process group was for the patient to identify ways in which they have in the past sabotaged their own recovery. Motivational Interviewing was utilized to ask the group members what they get out of their substance use, and how their behavior interferes with who they want to be.  The Stages of Change were explained, and members rated their motivation to change on a scale of 0 (no desire) to 10 (completely committed).  The patient expressed that she drinks to decreased her anxiety, but this definitely prevents her from being who she wants to be, which is a capable person who is respected.  Her motivation to change is 7, and she agreed to think about what it would take for her motivation to be greater than it currently is.  Sarina Ser 11/28/2012, 12:24 PM

## 2012-11-28 NOTE — Progress Notes (Signed)
Patient ID: Anita Russell, female   DOB: 05-24-1956, 57 y.o.   MRN: [id number] D: Patient mood/affect is anxious. Pt denies any other s/s of withdrawals. Pt denies SI/HI/AVH and pain. Pt is isolative in room much of the evening. Cooperative with assessment. No acute distressed noted at this time.   A: Met with pt 1:1. Medications administered as prescribed. Writer encouraged pt to discuss feelings. Pt encouraged to come to staff with any question or concerns. 15 minutes checks for safety.  R: Patient remains safe. She is complaint with medications. Continue current POC.

## 2012-11-28 NOTE — Progress Notes (Signed)
D) Pt verbalizing anxiety today. C/O chest pain but as she talked the pain dissipated. Pt states that she has had this discomfort before. States it is hard being here on the unit. "It is loud and I have trouble thinking. I can either go in my room and just sit there or I can go to the dayroom where it is very noisy and there is a lot of chatter. I like to have alone time".  A) Pt given an extra dose of Librium as well as some maalox. Provided with a 1:1 and encouraged to express her feelings. R) Denies  Is and HI. Rates her depression and hopelessness both at a 3.

## 2012-11-28 NOTE — Progress Notes (Addendum)
[facility] MD Progress Note  11/28/2012 3:04 PM Anita Russell  MRN:  [id number]  Subjective: "I feel better. I believe I'm suppose to go home tomorrow. I have an appointment to fill out an application for housing. I lost my home, that led me to reckless behavior (drinking alcohol). That was how I tried to deal with it. I feel better, more focused"  Diagnosis:   Axis I: Alcohol Abuse and Depression with suicidal ideation Axis II: Deferred Axis III:  Past Medical History  Diagnosis Date  . Gout   . Hypertension   . Thyroid disease   . Mental disorder   . Depression   . Asthma   . Hypothyroidism    Axis IV: other psychosocial or environmental problems Axis V: 41-50 serious symptoms  ADL's:  Intact  Sleep: Good  Appetite:  Good  Suicidal Ideation:  Denies Homicidal Ideation:  Denies AEB (as evidenced by):  Psychiatric Specialty Exam: Review of Systems  Constitutional: Negative.   HENT: Negative.   Eyes: Negative.   Respiratory: Negative.   Cardiovascular: Negative.   Gastrointestinal: Negative.   Genitourinary: Negative.   Musculoskeletal: Negative.   Skin: Negative.   Neurological: Negative.   Endo/Heme/Allergies: Negative.   Psychiatric/Behavioral: Positive for depression and substance abuse. Negative for suicidal ideas, hallucinations and memory loss. The patient is not nervous/anxious and does not have insomnia.     Blood pressure 143/94, pulse 60, temperature 98.1 F (36.7 C), temperature source Oral, resp. rate 20, height 5\' 8"  (1.727 m), weight 75.751 kg (167 lb), SpO2 97.00%.Body mass index is 25.4 kg/(m^2).  General Appearance: Casual  Eye Contact::  Good  Speech:  Clear and Coherent  Volume:  Normal  Mood:  "I feel better"  Affect:  Appropriate  Thought Process:  Coherent  Orientation:  Full (Time, Place, and Person)  Thought Content:  Rumination  Suicidal Thoughts:  No  Homicidal Thoughts:  No  Memory:  Immediate;   Good Recent;   Good Remote;    Good  Judgement:  Fair  Insight:  Fair  Psychomotor Activity:  Normal  Concentration:  Good  Recall:  Good  Akathisia:  No  Handed:  Right  AIMS (if indicated):     Assets:  Desire for Improvement  Sleep:  Number of Hours: 6.25   Current Medications: Current Facility-Administered Medications  Medication Dose Route Frequency Provider Last Rate Last Dose  . acetaminophen (TYLENOL) tablet 650 mg  650 mg Oral Q6H PRN Jorje Guild, PA-C      . alum & mag hydroxide-simeth (MAALOX/MYLANTA) 200-200-20 MG/5ML suspension 30 mL  30 mL Oral Q4H PRN Jorje Guild, PA-C      . chlordiazePOXIDE (LIBRIUM) capsule 25 mg  25 mg Oral Q6H PRN Jorje Guild, PA-C      . chlordiazePOXIDE (LIBRIUM) capsule 25 mg  25 mg Oral QID Jorje Guild, PA-C   25 mg at 11/28/12 1154   Followed by  . [START ON 11/29/2012] chlordiazePOXIDE (LIBRIUM) capsule 25 mg  25 mg Oral TID Jorje Guild, PA-C       Followed by  . [START ON 11/30/2012] chlordiazePOXIDE (LIBRIUM) capsule 25 mg  25 mg Oral BH-qamhs Jorje Guild, PA-C       Followed by  . [START ON 12/01/2012] chlordiazePOXIDE (LIBRIUM) capsule 25 mg  25 mg Oral Daily Jorje Guild, PA-C      . hydrOXYzine (ATARAX/VISTARIL) tablet 25 mg  25 mg Oral Q6H PRN Jorje Guild, PA-C      . levothyroxine (SYNTHROID, LEVOTHROID)  tablet 100 mcg  100 mcg Oral QAC breakfast Jorje Guild, PA-C   100 mcg at 11/28/12 0631  . loperamide (IMODIUM) capsule 2-4 mg  2-4 mg Oral PRN Jorje Guild, PA-C      . magnesium hydroxide (MILK OF MAGNESIA) suspension 30 mL  30 mL Oral Daily PRN Jorje Guild, PA-C      . metoprolol (LOPRESSOR) tablet 50 mg  50 mg Oral BID Jorje Guild, PA-C   50 mg at 11/28/12 0857  . multivitamin with minerals tablet 1 tablet  1 tablet Oral Daily Jorje Guild, PA-C   1 tablet at 11/28/12 0857  . nicotine (NICODERM CQ - dosed in mg/24 hours) patch 21 mg  21 mg Transdermal Q0600 Jorje Guild, PA-C   21 mg at 11/28/12 0631  . ondansetron (ZOFRAN-ODT) disintegrating tablet 4 mg  4 mg Oral Q6H PRN Jorje Guild, PA-C      .  sertraline (ZOLOFT) tablet 50 mg  50 mg Oral Daily Jorje Guild, PA-C   50 mg at 11/28/12 0856  . thiamine (VITAMIN B-1) tablet 100 mg  100 mg Oral Daily Jorje Guild, PA-C   100 mg at 11/28/12 0856  . traZODone (DESYREL) tablet 100 mg  100 mg Oral QHS,MR X 1 Nanine Means, NP   100 mg at 11/27/12 2215    Lab Results:  Results for orders placed during the hospital encounter of 11/26/12 (from the past 48 hour(s))  URINE RAPID DRUG SCREEN (HOSP PERFORMED)     Status: None   Collection Time    11/26/12  9:42 PM      Result Value Range   Opiates NONE DETECTED  NONE DETECTED   Cocaine NONE DETECTED  NONE DETECTED   Benzodiazepines NONE DETECTED  NONE DETECTED   Amphetamines NONE DETECTED  NONE DETECTED   Tetrahydrocannabinol NONE DETECTED  NONE DETECTED   Barbiturates NONE DETECTED  NONE DETECTED   Comment:            DRUG SCREEN FOR MEDICAL PURPOSES     ONLY.  IF CONFIRMATION IS NEEDED     FOR ANY PURPOSE, NOTIFY LAB     WITHIN 5 DAYS.                LOWEST DETECTABLE LIMITS     FOR URINE DRUG SCREEN     Drug Class       Cutoff (ng/mL)     Amphetamine      1000     Barbiturate      200     Benzodiazepine   200     Tricyclics       300     Opiates          300     Cocaine          300     THC              50  CBC WITH DIFFERENTIAL     Status: Abnormal   Collection Time    11/26/12 10:10 PM      Result Value Range   WBC 5.2  4.0 - 10.5 K/uL   RBC 5.13 (*) 3.87 - 5.11 MIL/uL   Hemoglobin 15.3 (*) 12.0 - 15.0 g/dL   HCT 16.1  09.6 - 04.5 %   MCV 87.3  78.0 - 100.0 fL   MCH 29.8  26.0 - 34.0 pg   MCHC 34.2  30.0 - 36.0 g/dL   RDW 40.9  81.1 -  15.5 %   Platelets 217  150 - 400 K/uL   Neutrophils Relative 41 (*) 43 - 77 %   Neutro Abs 2.1  1.7 - 7.7 K/uL   Lymphocytes Relative 50 (*) 12 - 46 %   Lymphs Abs 2.6  0.7 - 4.0 K/uL   Monocytes Relative 5  3 - 12 %   Monocytes Absolute 0.3  0.1 - 1.0 K/uL   Eosinophils Relative 4  0 - 5 %   Eosinophils Absolute 0.2  0.0 - 0.7 K/uL    Basophils Relative 0  0 - 1 %   Basophils Absolute 0.0  0.0 - 0.1 K/uL  BASIC METABOLIC PANEL     Status: None   Collection Time    11/26/12 10:10 PM      Result Value Range   Sodium 143  135 - 145 mEq/L   Potassium 3.9  3.5 - 5.1 mEq/L   Comment: SLIGHT HEMOLYSIS   Chloride 106  96 - 112 mEq/L   CO2 23  19 - 32 mEq/L   Glucose, Bld 98  70 - 99 mg/dL   BUN 9  6 - 23 mg/dL   Creatinine, Ser 4.78  0.50 - 1.10 mg/dL   Calcium 9.0  8.4 - 29.5 mg/dL   GFR calc non Af Amer >90  >90 mL/min   GFR calc Af Amer >90  >90 mL/min   Comment:            The eGFR has been calculated     using the CKD EPI equation.     This calculation has not been     validated in all clinical     situations.     eGFR's persistently     <90 mL/min signify     possible Chronic Kidney Disease.  ETHANOL     Status: Abnormal   Collection Time    11/26/12 10:10 PM      Result Value Range   Alcohol, Ethyl (B) 197 (*) 0 - 11 mg/dL   Comment:            LOWEST DETECTABLE LIMIT FOR     SERUM ALCOHOL IS 11 mg/dL     FOR MEDICAL PURPOSES ONLY    Physical Findings: AIMS: Facial and Oral Movements Muscles of Facial Expression: None, normal Lips and Perioral Area: None, normal Jaw: None, normal Tongue: None, normal,Extremity Movements Upper (arms, wrists, hands, fingers): None, normal Lower (legs, knees, ankles, toes): None, normal, Trunk Movements Neck, shoulders, hips: None, normal, Overall Severity Severity of abnormal movements (highest score from questions above): None, normal Incapacitation due to abnormal movements: None, normal Patient's awareness of abnormal movements (rate only patient's report): No Awareness, Dental Status Current problems with teeth and/or dentures?: No Does patient usually wear dentures?: No  CIWA:  CIWA-Ar Total: 0 COWS:     Treatment Plan Summary: Daily contact with patient to assess and evaluate symptoms and progress in treatment Medication management  Plan:Supportive  approach/coping skills/relapse prevention. Encouraged out of room, participation in group sessions and application of coping skills when distressed. Will continue to monitor response to/adverse effects of medications in use to assure effectiveness. Continue to monitor mood, behavior and interaction with staff and other patients. Possible discharge in am, patient states was told by the doctor Continue current plan of care.  Medical Decision Making Problem Points:  Established problem, stable/improving (1), Review of last therapy session (1) and Review of psycho-social stressors (1) Data Points:  Review of medication  regiment & side effects (2) Review of new medications or change in dosage (2)  I certify that inpatient services furnished can reasonably be expected to improve the patient's condition.   Armandina Stammer I 11/28/2012, 3:04 PM

## 2012-11-28 NOTE — Progress Notes (Signed)
Goals  Group Note  Date:  11/28/2012 Time: 030  Group Topic/Focus:  Identifying Goals :   The focus of this group is to help patients identify their personal  Goals they want to [facility] today. Pt shared that she wants to focus on dwelling on the POSITIVE things in her life and quit perserverating about the mistakes she's made, etcc... 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

## 2012-11-29 MED ORDER — INDOMETHACIN 50 MG PO CAPS
50.0000 mg | ORAL_CAPSULE | Freq: Three times a day (TID) | ORAL | Status: AC
  Administered 2012-11-29 – 2012-11-30 (×3): 50 mg via ORAL
  Filled 2012-11-29 (×3): qty 1

## 2012-11-29 MED ORDER — METOPROLOL TARTRATE 50 MG PO TABS
50.0000 mg | ORAL_TABLET | Freq: Two times a day (BID) | ORAL | Status: DC
  Filled 2012-11-29: qty 28

## 2012-11-29 MED ORDER — COLCHICINE 0.6 MG PO TABS
0.6000 mg | ORAL_TABLET | Freq: Every day | ORAL | Status: DC
  Administered 2012-11-30: 0.6 mg via ORAL
  Filled 2012-11-29 (×2): qty 1

## 2012-11-29 MED ORDER — LEVOTHYROXINE SODIUM 100 MCG PO TABS
100.0000 ug | ORAL_TABLET | Freq: Every day | ORAL | Status: DC
  Filled 2012-11-29: qty 14

## 2012-11-29 MED ORDER — INDOMETHACIN 25 MG PO CAPS
50.0000 mg | ORAL_CAPSULE | Freq: Three times a day (TID) | ORAL | Status: DC | PRN
  Filled 2012-11-29: qty 1

## 2012-11-29 MED ORDER — SERTRALINE HCL 50 MG PO TABS
50.0000 mg | ORAL_TABLET | Freq: Every day | ORAL | Status: DC
  Filled 2012-11-29: qty 14

## 2012-11-29 MED ORDER — COLCHICINE 0.6 MG PO TABS
0.6000 mg | ORAL_TABLET | Freq: Two times a day (BID) | ORAL | Status: AC
  Administered 2012-11-29 (×2): 0.6 mg via ORAL
  Filled 2012-11-29 (×2): qty 1

## 2012-11-29 MED ORDER — TRAZODONE HCL 100 MG PO TABS
100.0000 mg | ORAL_TABLET | Freq: Every evening | ORAL | Status: DC | PRN
  Filled 2012-11-29 (×2): qty 28

## 2012-11-29 NOTE — Progress Notes (Deleted)
Patient did attend the evening speaker AA meeting.  

## 2012-11-29 NOTE — Progress Notes (Signed)
Thankfulness  Group Note  Date: 11/29/2012 Time: 0900  Group Topic/Focus:  Identifying Thankfulness :   The focus of this group is to help patients identify  People / things / events in their lives they are thankful for..  Participation Level:  Did Not Attend  PAdditional Comments:    11/29/2012,9:48 AM Rich Brave

## 2012-11-29 NOTE — Progress Notes (Signed)
[facility] Group Notes:  (Nursing/MHT/Case Management/Adjunct)  Date:  11/28/2012  Time:  2100 Type of Therapy:  wrap up group  Participation Level:  Active  Participation Quality:  Appropriate, Attentive, Sharing and Supportive  Affect:  Appropriate  Cognitive:  Alert and Appropriate  Insight:  Good  Engagement in Group:  Engaged  Modes of Intervention:  Clarification, Education and Support  Summary of Progress/Problems:Pt reported, at first,  being back here made her feel like she had moved backwards and was right back where she was in October when she lost her home and animals.  But she now realizes that she is back in the hospital but still progressed since October.  When pt asks what is different for her now compared to October, pt states " I have learned a lot about addiction and I am very aware of my feelings and triggers". Pt is determined to get a job when she is discharged. Pt reported that she has previously been "scared to death" to try and get a job.    Shelah Lewandowsky 11/29/2012, 1:27 AM

## 2012-11-29 NOTE — [facility] Group Notes (Signed)
[facility] LCSW Group Therapy  11/29/2012 10:00-11:00am  Summary of Progress/Problems:  The main focus of today's process group was to   identify the patient's current support system and decide on other supports that can be put in place.  Four definitions/levels of support were discussed and an exercise was utilized to show how much stronger we become with additional supports providing accountability, improved physical and emotional health, and better problem-solving skills.  An emphasis was placed on using counselor, doctor, therapy groups, 12-step groups, and problem-specific support groups to expand supports. The patient expressed some irritability with her situation being "different" from other patients', in that she is homeless, living in her car, without gas to get to meetings.  The other patients addressed her fears and provided support, and she was appreciative, agreed that it does boil down to making a decision.  She is hopeful that [facility] will help her get into a facility.  Type of Therapy:  Group Therapy  Participation Level:  Active  Participation Quality:  Appropriate, Attentive, Sharing and Supportive  Affect:  Blunted and Irritable  Cognitive:  Alert, Appropriate and Oriented  Insight:  Engaged  Engagement in Therapy:  Engaged  Modes of Intervention:  Education, Exploration and Support  Sarina Ser 11/29/2012, 11:09 AM

## 2012-11-29 NOTE — Progress Notes (Signed)
Patient did attend the evening speaker AA meeting.  

## 2012-11-29 NOTE — Progress Notes (Signed)
Patient ID: Anita Russell, female   DOB: March 09, 1956, 57 y.o.   MRN: [id number] [facility] MD Progress Note  11/29/2012 12:24 PM Maleny Candy  MRN:  [id number]  Subjective: "I don't like what is happening to me now. So I can't go home today. I cannot miss this appointment that I have scheduled for tomorrow. I was suppose to be there at 09:00 am. Now this is getting me to be very upset. If I'm going to be discharged in am, it has to be at 08:00 on the dot". Patient denies SIHI, denies withdrawal symptoms.  Diagnosis:   Axis I: Alcohol Abuse and Depression with suicidal ideation Axis II: Deferred Axis III:  Past Medical History  Diagnosis Date  . Gout   . Hypertension   . Thyroid disease   . Mental disorder   . Depression   . Asthma   . Hypothyroidism    Axis IV: other psychosocial or environmental problems Axis V: 41-50 serious symptoms  ADL's:  Intact  Sleep: Good  Appetite:  Good  Suicidal Ideation:  Denies Homicidal Ideation:  Denies AEB (as evidenced by):  Psychiatric Specialty Exam: Review of Systems  Constitutional: Negative.   HENT: Negative.   Eyes: Negative.   Respiratory: Negative.   Cardiovascular: Negative.   Gastrointestinal: Negative.   Genitourinary: Negative.   Musculoskeletal: Negative.   Skin: Negative.   Neurological: Negative.   Endo/Heme/Allergies: Negative.   Psychiatric/Behavioral: Positive for depression and substance abuse. Negative for suicidal ideas, hallucinations and memory loss. The patient is not nervous/anxious and does not have insomnia.     Blood pressure 134/92, pulse 79, temperature 97.3 F (36.3 C), temperature source Oral, resp. rate 16, height 5\' 8"  (1.727 m), weight 75.751 kg (167 lb), SpO2 97.00%.Body mass index is 25.4 kg/(m^2).  General Appearance: Casual  Eye Contact::  Good  Speech:  Clear and Coherent  Volume:  Normal  Mood:  "I feel better"  Affect:  Appropriate  Thought Process:  Coherent  Orientation:  Full  (Time, Place, and Person)  Thought Content:  Rumination  Suicidal Thoughts:  No  Homicidal Thoughts:  No  Memory:  Immediate;   Good Recent;   Good Remote;   Good  Judgement:  Fair  Insight:  Fair  Psychomotor Activity:  Normal  Concentration:  Good  Recall:  Good  Akathisia:  No  Handed:  Right  AIMS (if indicated):     Assets:  Desire for Improvement  Sleep:  Number of Hours: 6   Current Medications: Current Facility-Administered Medications  Medication Dose Route Frequency Provider Last Rate Last Dose  . acetaminophen (TYLENOL) tablet 650 mg  650 mg Oral Q6H PRN Jorje Guild, PA-C      . chlordiazePOXIDE (LIBRIUM) capsule 25 mg  25 mg Oral Q6H PRN Jorje Guild, PA-C   25 mg at 11/28/12 2134  . chlordiazePOXIDE (LIBRIUM) capsule 25 mg  25 mg Oral TID Jorje Guild, PA-C   25 mg at 11/29/12 7829   Followed by  . [START ON 11/30/2012] chlordiazePOXIDE (LIBRIUM) capsule 25 mg  25 mg Oral BH-qamhs Jorje Guild, PA-C       Followed by  . [START ON 12/01/2012] chlordiazePOXIDE (LIBRIUM) capsule 25 mg  25 mg Oral Daily Jorje Guild, PA-C      . hydrOXYzine (ATARAX/VISTARIL) tablet 25 mg  25 mg Oral Q6H PRN Jorje Guild, PA-C      . levothyroxine (SYNTHROID, LEVOTHROID) tablet 100 mcg  100 mcg Oral QAC breakfast Jorje Guild, PA-C  100 mcg at 11/29/12 0643  . loperamide (IMODIUM) capsule 2-4 mg  2-4 mg Oral PRN Jorje Guild, PA-C      . magnesium hydroxide (MILK OF MAGNESIA) suspension 30 mL  30 mL Oral Daily PRN Jorje Guild, PA-C      . metoprolol (LOPRESSOR) tablet 50 mg  50 mg Oral BID Jorje Guild, PA-C   50 mg at 11/29/12 0820  . multivitamin with minerals tablet 1 tablet  1 tablet Oral Daily Jorje Guild, PA-C   1 tablet at 11/29/12 0820  . nicotine (NICODERM CQ - dosed in mg/24 hours) patch 21 mg  21 mg Transdermal Q0600 Jorje Guild, PA-C   21 mg at 11/29/12 0644  . ondansetron (ZOFRAN-ODT) disintegrating tablet 4 mg  4 mg Oral Q6H PRN Jorje Guild, PA-C      . sertraline (ZOLOFT) tablet 50 mg  50 mg Oral Daily Jorje Guild, PA-C   50 mg at 11/29/12 1610  . thiamine (VITAMIN B-1) tablet 100 mg  100 mg Oral Daily Jorje Guild, PA-C   100 mg at 11/29/12 9604  . traZODone (DESYREL) tablet 100 mg  100 mg Oral QHS,MR X 1 Nanine Means, NP   100 mg at 11/28/12 2128    Lab Results:  No results found for this or any previous visit (from the past 48 hour(s)).  Physical Findings: AIMS: Facial and Oral Movements Muscles of Facial Expression: None, normal Lips and Perioral Area: None, normal Jaw: None, normal Tongue: None, normal,Extremity Movements Upper (arms, wrists, hands, fingers): None, normal Lower (legs, knees, ankles, toes): None, normal, Trunk Movements Neck, shoulders, hips: None, normal, Overall Severity Severity of abnormal movements (highest score from questions above): None, normal Incapacitation due to abnormal movements: None, normal Patient's awareness of abnormal movements (rate only patient's report): No Awareness, Dental Status Current problems with teeth and/or dentures?: No Does patient usually wear dentures?: No  CIWA:  CIWA-Ar Total: 6 COWS:     Treatment Plan Summary: Daily contact with patient to assess and evaluate symptoms and progress in treatment Medication management  Plan:Supportive approach/coping skills/relapse prevention. Encouraged out of room, participation in group sessions and application of coping skills when distressed. Will continue to monitor response to/adverse effects of medications in use to assure effectiveness. Continue to monitor mood, behavior and interaction with staff and other patients. Possible discharge in am. Continue current plan of care.  Medical Decision Making Problem Points:  Established problem, stable/improving (1), Review of last therapy session (1) and Review of psycho-social stressors (1) Data Points:  Review of medication regiment & side effects (2) Review of new medications or change in dosage (2)  I certify that inpatient services  furnished can reasonably be expected to improve the patient's condition.   Armandina Stammer I 11/29/2012, 12:24 PM

## 2012-11-29 NOTE — Progress Notes (Addendum)
Pt came to the nurses station c./o right foot pain stating," I need something for this gout like colchine." NP made aware. Pt wanted to go to her car to get prednisone but was instructed she could not leave the unit.Pt stated at time sshe does feels very anxious. She does appear anxious at times and is concerned about the other pts well being. She would like to get a job once she leaves here and stop drinking. Pt denies SI or HI and does contract for safety. Her depression and hopelessness are a 7/10. No SI or HI and contracts for safety. Pt remains safe on q 15 minute checks. She has been attending groups. Pt does not appear to have any shakes, N/V or diarrhea this am. Pt is upset that she has an appt tomorrow at 9am at [facility] for housing and is disappointed that she is not being discharged today. NP made aware.

## 2012-11-30 DIAGNOSIS — F1994 Other psychoactive substance use, unspecified with psychoactive substance-induced mood disorder: Secondary | ICD-10-CM

## 2012-11-30 DIAGNOSIS — F1910 Other psychoactive substance abuse, uncomplicated: Secondary | ICD-10-CM

## 2012-11-30 MED ORDER — INDOMETHACIN 50 MG PO CAPS: 50.0000 mg | ORAL_CAPSULE | Freq: Three times a day (TID) | ORAL | Status: DC

## 2012-11-30 MED ORDER — TRAZODONE HCL 100 MG PO TABS: 100.0000 mg | ORAL_TABLET | Freq: Every evening | ORAL | Status: DC | PRN

## 2012-11-30 MED ORDER — COLCHICINE 0.6 MG PO TABS: 0.6000 mg | ORAL_TABLET | Freq: Every day | ORAL | Status: DC

## 2012-11-30 MED ORDER — HYDROXYZINE HCL 25 MG PO TABS: 25.0000 mg | ORAL_TABLET | Freq: Four times a day (QID) | ORAL | Status: DC | PRN

## 2012-11-30 NOTE — Progress Notes (Signed)
Adult Psychoeducational Group Note  Date:  11/30/2012 Time:  10:50 AM  Group Topic/Focus:  Coping skills pictionary  Participation Level:  Active  Participation Quality:  Appropriate  Affect:  Appropriate  Cognitive:  Alert  Insight: Good  Engagement in Group:  Engaged  Modes of Intervention:  Activity  Additional Comments:  PT said she is here for alcohol abuse.  Kaydi Kley T 11/30/2012, 10:50 AM

## 2012-11-30 NOTE — [facility] Group Notes (Signed)
[facility] LCSW Aftercare Discharge Planning Group Note   11/30/2012  8:45 AM  Participation Quality:  Appropriate  Mood/Affect:  Irritable  Depression Rating:  3-4  Anxiety Rating:  5  Thoughts of Suicide:  No  Will you contract for safety?   NA  Current AVH:  Negative  Plan for Discharge/Comments:  Plan is to still follow up at [facility]  Transportation Means: Patient's friend will pick her up and transport to her vehicle  Supports: Two close friends in area, AA groups and [facility] contact she made before current admission  Harrill, Julious Payer

## 2012-11-30 NOTE — Progress Notes (Signed)
Adult Psychoeducational Group Note  Date:  11/30/2012 Time:  1:10 PM  Group Topic/Focus:  Self Care:   The focus of this group is to help patients understand the importance of self-care in order to improve or restore emotional, physical, spiritual, interpersonal, and financial health.  Participation Level:  Active  Participation Quality:  Appropriate, Attentive and Sharing  Affect:  Appropriate  Cognitive:  Alert and Appropriate  Insight: Appropriate  Engagement in Group:  Engaged  Modes of Intervention:  Discussion  Additional Comments:  Pt was appropriate and attentive while attending group. 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 Pt also stated that she is good at texting friend and family back.   Sharyn Lull 11/30/2012, 1:10 PM

## 2012-11-30 NOTE — Progress Notes (Signed)
Patient denies SI/HI, denies A/V hallucinations. Patient verbalizes understanding of discharge instructions, follow up care and prescriptions. Patient given all belongings from [facility] locker. Patient escorted out by staff, transported by family. 

## 2012-11-30 NOTE — [facility] Suicide Risk Assessment (Signed)
Suicide Risk Assessment  Discharge Assessment     Demographic Factors:  Caucasian, Living alone and Unemployed  Mental Status Per Nursing Assessment::   On Admission:     Current Mental Status by Physician: In full contact with reality. There are no suicidal ideas, plans or intent. Her mood is anxious, worried. Her affect is appropriate. There are no suicidal ideas, plans or intent. She is planning to go to [facility] and pursue treatment through them.   Loss Factors: Decrease in vocational status and Financial problems/change in socioeconomic status  Historical Factors: NA  Risk Reduction Factors:   Positive social support  Continued Clinical Symptoms:  Alcohol/Substance Abuse/Dependencies  Cognitive Features That Contribute To Risk: None identified   Suicide Risk:  Minimal: No identifiable suicidal ideation.  Patients presenting with no risk factors but with morbid ruminations; may be classified as minimal risk based on the severity of the depressive symptoms  Discharge Diagnoses:   AXIS I:  Alcohol Dependence, Anxiety Disorder NOS, Depressive Disorder NOS AXIS II:  Deferred AXIS III:   Past Medical History  Diagnosis Date  . Gout   . Hypertension   . Thyroid disease   . Mental disorder   . Depression   . Asthma   . Hypothyroidism    AXIS IV:  economic problems, housing problems, occupational problems and other psychosocial or environmental problems AXIS V:  61-70 mild symptoms  Plan Of Care/Follow-up recommendations:  Activity:  as tolerated Diet:  regular Follow up [facility] Is patient on multiple antipsychotic therapies at discharge:  No   Has Patient had three or more failed trials of antipsychotic monotherapy by history:  No  Recommended Plan for Multiple Antipsychotic Therapies: N/A   Anita Russell A 11/30/2012, 3:15 PM

## 2012-11-30 NOTE — [facility] Group Notes (Signed)
[facility] LCSW Group Therapy  11/30/2012 1:15 PM  Type of Therapy:  Group Therapy  Participation Level:  Active  Participation Quality:  Appropriate and Attentive  Affect:  Appropriate  Cognitive:  Alert and Appropriate  Insight:  Developing/Improving and Engaged  Engagement in Therapy:  Developing/Improving and Engaged  Modes of Intervention:  Clarification, Confrontation, Discussion, Education, Exploration, Limit-setting, Orientation, Problem-solving, Rapport Building, Dance movement psychotherapist, Socialization and Support  Summary of Progress/Problems: The topic for group today was overcoming obstacles.  Pt discussed overcoming obstacles and what this means for pt. Pt states that she is overcoming a lot, dealing with being homeless, jobless and living in her car.  Pt states that she was previously sober for 111 days and relapsed.  Pt states that she knows she needs to go to [facility] for help with treatment and housing.  Pt states that she needs to focus on getting help to move forward in her life.  Pt provided positive feedback to peers throughout group and was supportive and actively listening.    Carmina Miller 11/30/2012, 2:24 PM

## 2012-11-30 NOTE — Progress Notes (Signed)
Patient ID: Anita Russell, female   DOB: [birth year], 57 y.o.   MRN: [id number] D)  Seems to be happy this evening, has been smiling and pleasant during conversations, and states she may be discharged tomorrow.  Voiced some concerns about her ride, stated her friend was having some problem with the boyfriend allowing her to use the car to pick up [facility], may be an issue getting to [facility].  Asked if staying an extra day was possible.  Encouraged her to speak with the case manager about the problem and make her aware.  States she is feeling better, no episodes of chest tightness today, sometimes still some anxiety, but overall, better.  Interacting appropriately with staff and peers, attended group. A)  Will continue to monitor for safety, continue POC, support R)  Appreciative, safety maintained.

## 2012-11-30 NOTE — Progress Notes (Signed)
[facility] Adult Case Management Discharge Plan :  Will you be returning to the same living situation after discharge: Yes, sharing time between friend's homes and sometimes sleeping in vehicle At discharge, do you have transportation home?:Yes friend Do you have the ability to pay for your medications:Yes  Release of information consent forms completed and in the chart;  Patient's signature needed at discharge.  Patient to Follow up at: Follow-up Information   Follow up with [facility] On 11/30/2012. (Appointment at [facility] is at 9:00 AM)    Contact information:   [address] [place] [place], [place] [address] Office: [phone]  Fax: [phone]       Patient denies SI/HI:  Denies both  Safety Planning and Suicide Prevention discussed:  Yes with patient  Clide Dales 11/30/2012, 11:49 PM

## 2012-12-03 NOTE — Progress Notes (Addendum)
Patient Discharge Instructions:  After Visit Summary (AVS):   Faxed to:  12/03/12 Psychiatric Admission Assessment Note:   Faxed to:  12/03/12 Suicide Risk Assessment - Discharge Assessment:   Faxed to:  12/03/12 Faxed/Sent to the Next Level Care provider:  12/03/12 Faxed to [facility] @ [phone]  Jerelene Redden, 12/03/2012, 3:38 PM

## 2012-12-04 NOTE — Discharge Summary (Signed)
Physician Discharge Summary Note  Patient:  Anita Russell is an 57 y.o., female MRN:  [id number] DOB:  April 15, 1956 Patient phone:  [phone] (home)  Patient address:   Homeless [address] [place] [address],   Date of Admission:  11/27/2012 Date of Discharge: 11/30/2012  Reason for Admission:  Alcohol dependency/detox  Discharge Diagnoses: Active Problems:   * No active hospital problems. *  Review of Systems  Constitutional: Negative.   HENT: Negative.   Eyes: Negative.   Respiratory: Negative.   Cardiovascular: Negative.   Gastrointestinal: Negative.   Genitourinary: Negative.   Musculoskeletal: Negative.   Skin: Negative.   Neurological: Negative.   Endo/Heme/Allergies: Negative.   Psychiatric/Behavioral: Positive for substance abuse.   Axis Diagnosis:   AXIS I:  Alcohol Abuse, Substance Abuse and Substance Induced Mood Disorder AXIS II:  Deferred AXIS III:   Past Medical History  Diagnosis Date  . Gout   . Hypertension   . Thyroid disease   . Mental disorder   . Depression   . Asthma   . Hypothyroidism    AXIS IV:  housing problems, occupational problems, other psychosocial or environmental problems, problems related to social environment and problems with primary support group AXIS V:  61-70 mild symptoms  Level of Care:  OP  Hospital Course:  On admission: She was ere in October 2013. After she left here she went to [facility] then to [facility]. Was let go with no follow up. She contacted someone she met at [facility] who told her about [facility]. She started going to [facility], they were planning to help with resources. She relapsed the first week of March. She was living out of her car. Got in a relationship that did not work out. She had taken off with that person. Came back yesterday started drinking, felt suicidal. Came for help.   During hospitalization:  Librium protocol to detox from alcohol, klonopin and tramadol discontinued, her blood pressure and  thyroid medications were continued.  Colchicine started for her gout, Vistaril 25 mg PRN anxiety, Indomethacin for gout pain, and Trazodone 100 mg for sleep issues.  She attended and participated in groups, especially AA.  Anita Russell wants to stay sober so she can get a job and housing.  Patient denied suicidal/homicidal ideations and auditory/visual hallucinations, follow-up appointments encouraged to attend, outside support groups encouraged and information given, Rx and 14 day supply of medications given at discharge.  Anita Russell is mentally and physically stable for discharge.  Consults:  None  Significant Diagnostic Studies:  labs: Completed and reviewed, stable  Discharge Vitals:   Blood pressure 142/87, pulse 79, temperature 96.6 F (35.9 C), temperature source Oral, resp. rate 18, height 5\' 8"  (1.727 m), weight 75.751 kg (167 lb), SpO2 97.00%. Body mass index is 25.4 kg/(m^2). Lab Results:   No results found for this or any previous visit (from the past 72 hour(s)).  Physical Findings: AIMS: Facial and Oral Movements Muscles of Facial Expression: None, normal Lips and Perioral Area: None, normal Jaw: None, normal Tongue: None, normal,Extremity Movements Upper (arms, wrists, hands, fingers): None, normal Lower (legs, knees, ankles, toes): None, normal, Trunk Movements Neck, shoulders, hips: None, normal, Overall Severity Severity of abnormal movements (highest score from questions above): None, normal Incapacitation due to abnormal movements: None, normal Patient's awareness of abnormal movements (rate only patient's report): No Awareness, Dental Status Current problems with teeth and/or dentures?: No Does patient usually wear dentures?: No  CIWA:  CIWA-Ar Total: 0 COWS:  Psychiatric Specialty Exam: See Psychiatric Specialty Exam and Suicide Risk Assessment completed by Attending Physician prior to discharge.  Discharge destination:  Home  Is patient on multiple antipsychotic  therapies at discharge:  No   Has Patient had three or more failed trials of antipsychotic monotherapy by history:  No Recommended Plan for Multiple Antipsychotic Therapies:  N/a  Discharge Orders   Future Orders Complete By Expires     Activity as tolerated - No restrictions  As directed     Diet - low sodium heart healthy  As directed         Medication List    STOP taking these medications       clonazePAM 0.5 MG tablet  Commonly known as:  KLONOPIN     traMADol 50 MG tablet  Commonly known as:  ULTRAM      TAKE these medications     Indication   albuterol 108 (90 BASE) MCG/ACT inhaler  Commonly known as:  PROVENTIL HFA;VENTOLIN HFA  Inhale 2 puffs into the lungs every 6 (six) hours as needed. For shortness of breath      colchicine 0.6 MG tablet  Take 1 tablet (0.6 mg total) by mouth daily.   Indication:  Acute Joint Inflammation in Gout     hydrOXYzine 25 MG tablet  Commonly known as:  ATARAX/VISTARIL  Take 1 tablet (25 mg total) by mouth every 6 (six) hours as needed for anxiety.      indomethacin 50 MG capsule  Commonly known as:  INDOCIN  Take 1 capsule (50 mg total) by mouth 3 (three) times daily with meals. PRN   Indication:  Acute Joint Inflammation in Gout     levothyroxine 100 MCG tablet  Commonly known as:  SYNTHROID, LEVOTHROID  Take 1 tablet (100 mcg total) by mouth daily. For thyroid disease.   Indication:  Underactive Thyroid     metoprolol 50 MG tablet  Commonly known as:  LOPRESSOR  Take 1 tablet (50 mg total) by mouth 2 (two) times daily. For hypertension.      sertraline 50 MG tablet  Commonly known as:  ZOLOFT  Take 50 mg by mouth daily. For anxiety and depression.   Indication:  Anxiety Disorder     traZODone 100 MG tablet  Commonly known as:  DESYREL  Take 1 tablet (100 mg total) by mouth at bedtime and may repeat dose one time if needed.   Indication:  Trouble Sleeping           Follow-up Information   Follow up with [facility] On  11/30/2012. (Appointment at [facility] is at 9:00 AM)    Contact information:   [address] [place] [place], [place] [address] Office: [phone]  Fax: [phone]       Follow-up recommendations:  Activity:  As tolerated Diet:  Low-sodium heart healthy diet Continue to work the relapse prevention plan Comments:  Patient will continue her care at [facility]  Total Discharge Time:  Greater than 30 minutes.  SignedNanine Means, PMH-NP 12/04/2012, 4:37 PM

## 2013-08-17 ENCOUNTER — Emergency Department (HOSPITAL_COMMUNITY)
Admission: EM | Admit: 2013-08-17 | Discharge: 2013-08-17 | Disposition: A | Discharge status: Home / Self Care | Payer: No Typology Code available for payment source | Attending: Emergency Medicine | Admitting: Emergency Medicine

## 2013-08-17 ENCOUNTER — Encounter (HOSPITAL_COMMUNITY): Payer: Self-pay | Admitting: Emergency Medicine

## 2013-08-17 DIAGNOSIS — F3289 Other specified depressive episodes: Secondary | ICD-10-CM | POA: Insufficient documentation

## 2013-08-17 DIAGNOSIS — R21 Rash and other nonspecific skin eruption: Secondary | ICD-10-CM

## 2013-08-17 DIAGNOSIS — E039 Hypothyroidism, unspecified: Secondary | ICD-10-CM | POA: Insufficient documentation

## 2013-08-17 DIAGNOSIS — Z792 Long term (current) use of antibiotics: Secondary | ICD-10-CM | POA: Insufficient documentation

## 2013-08-17 DIAGNOSIS — J45909 Unspecified asthma, uncomplicated: Secondary | ICD-10-CM | POA: Insufficient documentation

## 2013-08-17 DIAGNOSIS — I10 Essential (primary) hypertension: Secondary | ICD-10-CM | POA: Insufficient documentation

## 2013-08-17 DIAGNOSIS — L089 Local infection of the skin and subcutaneous tissue, unspecified: Secondary | ICD-10-CM | POA: Insufficient documentation

## 2013-08-17 DIAGNOSIS — F329 Major depressive disorder, single episode, unspecified: Secondary | ICD-10-CM | POA: Insufficient documentation

## 2013-08-17 DIAGNOSIS — L259 Unspecified contact dermatitis, unspecified cause: Secondary | ICD-10-CM | POA: Insufficient documentation

## 2013-08-17 DIAGNOSIS — Z79899 Other long term (current) drug therapy: Secondary | ICD-10-CM | POA: Insufficient documentation

## 2013-08-17 DIAGNOSIS — F17200 Nicotine dependence, unspecified, uncomplicated: Secondary | ICD-10-CM | POA: Insufficient documentation

## 2013-08-17 DIAGNOSIS — IMO0002 Reserved for concepts with insufficient information to code with codable children: Secondary | ICD-10-CM | POA: Insufficient documentation

## 2013-08-17 MED ORDER — MUPIROCIN CALCIUM 2 % NA OINT: TOPICAL_OINTMENT | NASAL | Status: DC

## 2013-08-17 MED ORDER — PREDNISONE 20 MG PO TABS: ORAL_TABLET | ORAL | Status: DC

## 2013-08-17 MED ORDER — CLINDAMYCIN HCL 300 MG PO CAPS: 300.0000 mg | ORAL_CAPSULE | Freq: Four times a day (QID) | ORAL | Status: DC

## 2013-08-17 MED ORDER — DEXAMETHASONE SODIUM PHOSPHATE 10 MG/ML IJ SOLN
10.0000 mg | Freq: Once | INTRAMUSCULAR | Status: AC
  Administered 2013-08-17: 10 mg via INTRAMUSCULAR
  Filled 2013-08-17: qty 1

## 2013-08-17 NOTE — ED Notes (Signed)
Pt c/o red rash all over body her body that broke out about two weeks ago that itches. Pt states that she has spots on her chest that are draining clear liquid. Pt states she had place on her neck and forehead that she was supposed to follow up with two years ago but pt states that she lost her home and now homeless and couldn't afford anyone to a doctor for it.  Pt started on lisinopril on 07/19/13.

## 2013-08-17 NOTE — ED Provider Notes (Signed)
Medical screening examination/treatment/procedure(s) were performed by non-physician practitioner and as supervising physician I was immediately available for consultation/collaboration.  EKG Interpretation   None        Ethelda Chick, MD 08/17/13 [phone]

## 2013-08-17 NOTE — ED Provider Notes (Signed)
CSN: [id number]     Arrival date & time 08/17/13  0825 History   First MD Initiated Contact with Patient 08/17/13 [phone]     Chief Complaint  Patient presents with  . Rash   (Consider location/radiation/quality/duration/timing/severity/associated sxs/prior Treatment) HPI Comments: Patient is a 57 year old female who presents to the emergency department complaining of a rash x2 weeks. Patient states she has had a red rash breakout all over her body that is very itchy, she has scratched so much causing some areas to open up and drain clear fluid. Denies fever. Admits to using a new laundry detergent over the past 4 weeks. She was also living in a tent 1 month ago, recently moved into a new trailer. Began lisinopril on 11/24, however did not have any problems until a couple weeks later. Denies difficulty breathing or swallowing. No contacts with similar rash. States her nose is irritated. Denies fever.  Patient is a 57 y.o. female presenting with rash. The history is provided by the patient.  Rash   Past Medical History  Diagnosis Date  . Gout   . Hypertension   . Thyroid disease   . Mental disorder   . Depression   . Asthma   . Hypothyroidism    Past Surgical History  Procedure Laterality Date  . Appendectomy    . Abdominal hysterectomy     No family history on file. History  Substance Use Topics  . Smoking status: Current Every Day Smoker -- 1.00 packs/day    Types: Cigarettes  . Smokeless tobacco: Not on file  . Alcohol Use: No   OB History   Grav Para Term Preterm Abortions TAB SAB Ect Mult Living                 Review of Systems  Skin: Positive for rash.  All other systems reviewed and are negative.    Allergies  Review of patient's allergies indicates no known allergies.  Home Medications   Current Outpatient Rx  Name  Route  Sig  Dispense  Refill  . albuterol (PROVENTIL HFA;VENTOLIN HFA) 108 (90 BASE) MCG/ACT inhaler   Inhalation   Inhale 2 puffs into the  lungs every 6 (six) hours as needed. For shortness of breath         . levothyroxine (SYNTHROID, LEVOTHROID) 100 MCG tablet   Oral   Take 1 tablet (100 mcg total) by mouth daily. For thyroid disease.         Marland Kitchen lisinopril-hydrochlorothiazide (PRINZIDE,ZESTORETIC) 20-12.5 MG per tablet   Oral   Take 1 tablet by mouth daily.         . metoprolol (LOPRESSOR) 100 MG tablet   Oral   Take 100 mg by mouth 2 (two) times daily.         . sertraline (ZOLOFT) 50 MG tablet   Oral   Take 50 mg by mouth daily. For anxiety and depression.         . clindamycin (CLEOCIN) 300 MG capsule   Oral   Take 1 capsule (300 mg total) by mouth 4 (four) times daily. X 7 days   28 capsule   0   . mupirocin nasal ointment (BACTROBAN) 2 %      Apply in each nostril daily   10 g   0   . predniSONE (DELTASONE) 20 MG tablet      2 tabs po daily x 3 days   6 tablet   0    BP 161/93  Pulse 62  Temp(Src) 98.2 F (36.8 C) (Oral)  Resp 18  SpO2 96% Physical Exam  Nursing note and vitals reviewed. Constitutional: She is oriented to person, place, and time. She appears well-developed and well-nourished. No distress.  HENT:  Head: Normocephalic and atraumatic.  Mouth/Throat: Oropharynx is clear and moist.  No oral lesions. Crusting noted in bilateral nares.  Eyes: Conjunctivae are normal.  Neck: Normal range of motion. Neck supple.  Cardiovascular: Normal rate, regular rhythm and normal heart sounds.   Pulmonary/Chest: Effort normal and breath sounds normal.  Musculoskeletal: Normal range of motion. She exhibits no edema.  Neurological: She is alert and oriented to person, place, and time.  Skin: She is not diaphoretic.  Scattered raised erythematous areas on chest, abdomen, upper back, bilateral arms and legs, sparing palms of hands and soles of feet. Some areas with overlying scabs, irritation concerning for secondary infection. No lesions in web spaces of fingers or toes, no burrows.   Psychiatric: She has a normal mood and affect. Her behavior is normal.    ED Course  Procedures (including critical care time) Labs Review Labs Reviewed - No data to display Imaging Review No results found.  EKG Interpretation   None       MDM   1. Rash   2. Contact dermatitis   3. Skin infection    Patient was secondarily infected contact dermatitis. She is well appearing and in no apparent distress. Also with scabs in nose concerning for MRSA. Will treat with prednisone, clinda and nasal Bactroban. Return precautions given. Patient states understanding of plan and is agreeable.    Trevor Mace, PA-C 08/17/13 [phone]

## 2013-08-17 NOTE — Progress Notes (Signed)
[facility] spoke with patient about [employer]. Patient confirmed her Anita Russell was through the [employer].

## 2013-10-29 ENCOUNTER — Ambulatory Visit: Payer: No Typology Code available for payment source | Attending: Internal Medicine | Admitting: Internal Medicine

## 2013-10-29 ENCOUNTER — Encounter: Payer: Self-pay | Admitting: Internal Medicine

## 2013-10-29 VITALS — BP 162/90 | HR 76 | Temp 98.6°F | Resp 18 | Ht 69.0 in | Wt 179.0 lb

## 2013-10-29 DIAGNOSIS — H9319 Tinnitus, unspecified ear: Secondary | ICD-10-CM | POA: Insufficient documentation

## 2013-10-29 DIAGNOSIS — I10 Essential (primary) hypertension: Secondary | ICD-10-CM

## 2013-10-29 DIAGNOSIS — F329 Major depressive disorder, single episode, unspecified: Secondary | ICD-10-CM | POA: Insufficient documentation

## 2013-10-29 DIAGNOSIS — F32A Depression, unspecified: Secondary | ICD-10-CM | POA: Insufficient documentation

## 2013-10-29 DIAGNOSIS — F419 Anxiety disorder, unspecified: Secondary | ICD-10-CM

## 2013-10-29 DIAGNOSIS — E039 Hypothyroidism, unspecified: Secondary | ICD-10-CM

## 2013-10-29 DIAGNOSIS — M1990 Unspecified osteoarthritis, unspecified site: Secondary | ICD-10-CM | POA: Insufficient documentation

## 2013-10-29 DIAGNOSIS — Z0000 Encounter for general adult medical examination without abnormal findings: Secondary | ICD-10-CM | POA: Insufficient documentation

## 2013-10-29 DIAGNOSIS — F1010 Alcohol abuse, uncomplicated: Secondary | ICD-10-CM

## 2013-10-29 LAB — COMPREHENSIVE METABOLIC PANEL
ALBUMIN: 4.3 g/dL (ref 3.5–5.2)
ALT: 14 U/L (ref 0–35)
AST: 18 U/L (ref 0–37)
Alkaline Phosphatase: 51 U/L (ref 39–117)
BUN: 14 mg/dL (ref 6–23)
CALCIUM: 9.5 mg/dL (ref 8.4–10.5)
CHLORIDE: 98 meq/L (ref 96–112)
CO2: 30 meq/L (ref 19–32)
Creat: 0.72 mg/dL (ref 0.50–1.10)
GLUCOSE: 107 mg/dL — AB (ref 70–99)
POTASSIUM: 4.4 meq/L (ref 3.5–5.3)
Sodium: 137 mEq/L (ref 135–145)
Total Bilirubin: 1 mg/dL (ref 0.2–1.2)
Total Protein: 7.3 g/dL (ref 6.0–8.3)

## 2013-10-29 LAB — CBC WITH DIFFERENTIAL/PLATELET
BASOS ABS: 0 10*3/uL (ref 0.0–0.1)
Basophils Relative: 0 % (ref 0–1)
EOS PCT: 3 % (ref 0–5)
Eosinophils Absolute: 0.1 10*3/uL (ref 0.0–0.7)
HEMATOCRIT: 41.9 % (ref 36.0–46.0)
HEMOGLOBIN: 14.6 g/dL (ref 12.0–15.0)
LYMPHS ABS: 1.8 10*3/uL (ref 0.7–4.0)
LYMPHS PCT: 37 % (ref 12–46)
MCH: 30.4 pg (ref 26.0–34.0)
MCHC: 34.8 g/dL (ref 30.0–36.0)
MCV: 87.3 fL (ref 78.0–100.0)
MONO ABS: 0.4 10*3/uL (ref 0.1–1.0)
MONOS PCT: 8 % (ref 3–12)
NEUTROS ABS: 2.5 10*3/uL (ref 1.7–7.7)
Neutrophils Relative %: 52 % (ref 43–77)
Platelets: 233 10*3/uL (ref 150–400)
RBC: 4.8 MIL/uL (ref 3.87–5.11)
RDW: 14.9 % (ref 11.5–15.5)
WBC: 4.8 10*3/uL (ref 4.0–10.5)

## 2013-10-29 LAB — URIC ACID: Uric Acid, Serum: 7.1 mg/dL — ABNORMAL HIGH (ref 2.4–7.0)

## 2013-10-29 LAB — LIPID PANEL
CHOLESTEROL: 188 mg/dL (ref 0–200)
HDL: 76 mg/dL (ref >39)
LDL Cholesterol: 68 mg/dL (ref 0–99)
Total CHOL/HDL Ratio: 2.5 Ratio
Triglycerides: 218 mg/dL — ABNORMAL HIGH (ref <150)
VLDL: 44 mg/dL — ABNORMAL HIGH (ref 0–40)

## 2013-10-29 LAB — RHEUMATOID FACTOR: Rhuematoid fact SerPl-aCnc: <10 IU/mL (ref <=14)

## 2013-10-29 LAB — C-REACTIVE PROTEIN

## 2013-10-29 MED ORDER — BUSPIRONE HCL 10 MG PO TABS: 10.0000 mg | ORAL_TABLET | Freq: Every day | ORAL | Status: DC

## 2013-10-29 MED ORDER — LOSARTAN POTASSIUM-HCTZ 50-12.5 MG PO TABS: 1.0000 | ORAL_TABLET | Freq: Every day | ORAL | Status: DC

## 2013-10-29 MED ORDER — METOPROLOL TARTRATE 100 MG PO TABS: 100.0000 mg | ORAL_TABLET | Freq: Two times a day (BID) | ORAL | Status: DC

## 2013-10-29 MED ORDER — SERTRALINE HCL 100 MG PO TABS: 100.0000 mg | ORAL_TABLET | Freq: Every day | ORAL | Status: DC

## 2013-10-29 MED ORDER — LEVOTHYROXINE SODIUM 100 MCG PO TABS: 100.0000 ug | ORAL_TABLET | Freq: Every day | ORAL | Status: DC

## 2013-10-29 NOTE — Progress Notes (Unsigned)
Patient ID: Jazalynn Mireles, female   DOB: 1955/11/24, 58 y.o.   MRN: [id number] Patient Demographics  Keron Koffman, is a 58 y.o. female  OZD:[id number]  QVZ:[id number]  DOB - [birth year]  Chief Complaint  Patient presents with  . Establish Care  . Hypertension  . Gout  . Hypothyroidism        Subjective:   Anita Russell today is here to establish primary care.  Patient is a 59 year old female with history of hypertension, hypothyroidism, arthritis, depression presents here to establish primary care. BP has been elevated 162/90. Has significant anxiety issues. She has noticed arthritis and pain in her both hand and fingers Patient has No headache, No chest pain, No abdominal pain - No Nausea, No new weakness tingling or numbness, No Cough - SOB.   Objective:    Filed Vitals:   10/29/13 1026  BP: 162/90  Pulse: 76  Temp: 98.6 F (37 C)  TempSrc: Oral  Resp: 18  Height: 5' 9"  (1.753 m)  Weight: 179 lb (81.194 kg)  SpO2: 100%     ALLERGIES:   Allergies  Allergen Reactions  . Meloxicam     Very bad rash    PAST MEDICAL HISTORY: Past Medical History  Diagnosis Date  . Gout   . Hypertension   . Thyroid disease   . Mental disorder   . Depression   . Asthma   . Hypothyroidism     PAST SURGICAL HISTORY: Past Surgical History  Procedure Laterality Date  . Appendectomy    . Abdominal hysterectomy      FAMILY HISTORY: Family History  Problem Relation Age of Onset  . Arthritis Father     MEDICATIONS AT HOME: Prior to Admission medications   Medication Sig Start Date End Date Taking? Authorizing Provider  levothyroxine (SYNTHROID, LEVOTHROID) 100 MCG tablet Take 1 tablet (100 mcg total) by mouth daily before breakfast. For thyroid disease. 10/29/13  Yes Ripudeep Krystal Eaton, MD  metoprolol (LOPRESSOR) 100 MG tablet Take 1 tablet (100 mg total) by mouth 2 (two) times daily. 10/29/13  Yes Ripudeep Krystal Eaton, MD  sertraline (ZOLOFT) 100 MG tablet Take 1 tablet  (100 mg total) by mouth daily. For anxiety and depression. 10/29/13  Yes Ripudeep Krystal Eaton, MD  albuterol (PROVENTIL HFA;VENTOLIN HFA) 108 (90 BASE) MCG/ACT inhaler Inhale 2 puffs into the lungs every 6 (six) hours as needed. For shortness of breath    Historical Provider, MD  busPIRone (BUSPAR) 10 MG tablet Take 1 tablet (10 mg total) by mouth daily. 10/29/13   Ripudeep Krystal Eaton, MD  clindamycin (CLEOCIN) 300 MG capsule Take 1 capsule (300 mg total) by mouth 4 (four) times daily. X 7 days 08/17/13   Illene Labrador, PA-C  lisinopril-hydrochlorothiazide (PRINZIDE,ZESTORETIC) 20-12.5 MG per tablet Take 1 tablet by mouth daily.    Historical Provider, MD  losartan-hydrochlorothiazide (HYZAAR) 50-12.5 MG per tablet Take 1 tablet by mouth daily. 10/29/13   Ripudeep Krystal Eaton, MD  mupirocin nasal ointment (BACTROBAN) 2 % Apply in each nostril daily 08/17/13   Illene Labrador, PA-C  predniSONE (DELTASONE) 20 MG tablet 2 tabs po daily x 3 days 08/17/13   Illene Labrador, PA-C    REVIEW OF SYSTEMS:  Constitutional:   No   Fevers, chills, fatigue.  HEENT:    No headaches, Sore throat,   Cardio-vascular: No chest pain,  Orthopnea, swelling in lower extremities, anasarca, palpitations  GI:  No abdominal pain, nausea, vomiting, diarrhea  Resp: No shortness of breath,  No coughing up of blood.No cough.No wheezing.  Skin:  no rash or lesions.  GU:  no dysuria, change in color of urine, no urgency or frequency.  No flank pain.  Musculoskeletal: No joint pain or swelling.  No decreased range of motion.  No back pain. Pain in fingers  Psych: Anxiety issues   Exam  General appearance :Awake, alert, NAD, Speech Clear. HEENT: Atraumatic and Normocephalic, PERLA Neck: supple, no JVD. No cervical lymphadenopathy.  Chest: clear to auscultation bilaterally, no wheezing, rales or rhonchi CVS: S1 S2 regular, no murmurs.  Abdomen: soft, NBS, NT, ND, no gaurding, rigidity or rebound. Extremities: No cyanosis,  clubbing, B/L Lower Ext shows no edema, swelling in PIP and DIP joints, slight deformities Neurology: Awake alert, and oriented X 3, CN II-XII intact, Non focal Skin:No Rash or lesions Wounds: N/A    Data Review   Basic Metabolic Panel: No results found for this basename: NA, K, CL, CO2, GLUCOSE, BUN, CREATININE, CALCIUM, MG, PHOS,  in the last 168 hours Liver Function Tests: No results found for this basename: AST, ALT, ALKPHOS, BILITOT, PROT, ALBUMIN,  in the last 168 hours  CBC: No results found for this basename: WBC, NEUTROABS, HGB, HCT, MCV, PLT,  in the last 168 hours ------------------------------------------------------------------------------------------------------------------ No results found for this basename: HGBA1C,  in the last 72 hours ------------------------------------------------------------------------------------------------------------------ No results found for this basename: CHOL, HDL, LDLCALC, TRIG, CHOLHDL, LDLDIRECT,  in the last 72 hours ------------------------------------------------------------------------------------------------------------------ No results found for this basename: TSH, T4TOTAL, FREET3, T3FREE, THYROIDAB,  in the last 72 hours ------------------------------------------------------------------------------------------------------------------ No results found for this basename: VITAMINB12, FOLATE, FERRITIN, TIBC, IRON, RETICCTPCT,  in the last 72 hours  Coagulation profile  No results found for this basename: INR, PROTIME,  in the last 168 hours    Assessment & Plan   Active Problems: HTN - Continue metoprolol, placed on losartan/HCTZ 50/ 12.61m daily  Anxiety/depression - Increase sertraline to 100 mg daily, added BuSpar 133mdaily  Hypothyroidism - check TSH, continue Synthroid  General health screening/health maintenance - Obtain CBC, CMET, lipid panel, TSH -Screening mammogram ordered - Patient will need another  appointment for Pap smear   Arthritis - check ESR, CRP, uric acid, RA factor. Patient to step her father had arthritis although she does not now if it was rheumatoid arthritis   Tinnitus - No ear infection, she will need a hearing test and ENT referral, sent  Recommendations: another appointment for Pap smear, follow labs  Follow-up in One month   RAI,RIPUDEEP M.D. 10/29/2013, 10:49 AM

## 2013-10-30 LAB — TSH: TSH: 1.419 u[IU]/mL (ref 0.350–4.500)

## 2013-10-30 LAB — SEDIMENTATION RATE: SED RATE: 1 mm/h (ref 0–22)

## 2013-10-30 NOTE — Progress Notes (Unsigned)
Pt here to establish care for HTN,Thyroid disease Need medication refills  Pt has ran out of all medication

## 2013-11-18 ENCOUNTER — Other Ambulatory Visit: Payer: Self-pay

## 2013-11-18 DIAGNOSIS — Z1231 Encounter for screening mammogram for malignant neoplasm of breast: Secondary | ICD-10-CM

## 2013-11-24 ENCOUNTER — Ambulatory Visit: Payer: Self-pay

## 2013-11-30 ENCOUNTER — Ambulatory Visit: Payer: Self-pay

## 2013-11-30 ENCOUNTER — Ambulatory Visit: Payer: Self-pay | Admitting: Internal Medicine

## 2013-12-01 ENCOUNTER — Ambulatory Visit
Admission: RE | Admit: 2013-12-01 | Discharge: 2013-12-01 | Disposition: A | Discharge status: Home / Self Care | Payer: No Typology Code available for payment source | Source: Ambulatory Visit | Attending: Internal Medicine | Admitting: Internal Medicine

## 2013-12-01 DIAGNOSIS — E039 Hypothyroidism, unspecified: Secondary | ICD-10-CM

## 2013-12-01 DIAGNOSIS — F32A Depression, unspecified: Secondary | ICD-10-CM

## 2013-12-01 DIAGNOSIS — Z0000 Encounter for general adult medical examination without abnormal findings: Secondary | ICD-10-CM

## 2013-12-01 DIAGNOSIS — F419 Anxiety disorder, unspecified: Secondary | ICD-10-CM

## 2013-12-01 DIAGNOSIS — F329 Major depressive disorder, single episode, unspecified: Secondary | ICD-10-CM

## 2013-12-01 DIAGNOSIS — F1010 Alcohol abuse, uncomplicated: Secondary | ICD-10-CM

## 2013-12-01 DIAGNOSIS — M1990 Unspecified osteoarthritis, unspecified site: Secondary | ICD-10-CM

## 2013-12-01 DIAGNOSIS — I10 Essential (primary) hypertension: Secondary | ICD-10-CM

## 2014-01-06 ENCOUNTER — Ambulatory Visit: Payer: No Typology Code available for payment source | Attending: Internal Medicine

## 2014-01-06 ENCOUNTER — Ambulatory Visit: Payer: No Typology Code available for payment source | Attending: Internal Medicine | Admitting: Internal Medicine

## 2014-01-06 ENCOUNTER — Encounter: Payer: Self-pay | Admitting: Internal Medicine

## 2014-01-06 VITALS — BP 150/90 | HR 64 | Temp 98.8°F | Resp 16 | Wt 180.4 lb

## 2014-01-06 DIAGNOSIS — E039 Hypothyroidism, unspecified: Secondary | ICD-10-CM | POA: Insufficient documentation

## 2014-01-06 DIAGNOSIS — H9319 Tinnitus, unspecified ear: Secondary | ICD-10-CM | POA: Insufficient documentation

## 2014-01-06 DIAGNOSIS — H9190 Unspecified hearing loss, unspecified ear: Secondary | ICD-10-CM

## 2014-01-06 DIAGNOSIS — F17200 Nicotine dependence, unspecified, uncomplicated: Secondary | ICD-10-CM | POA: Insufficient documentation

## 2014-01-06 DIAGNOSIS — F341 Dysthymic disorder: Secondary | ICD-10-CM | POA: Insufficient documentation

## 2014-01-06 DIAGNOSIS — F32A Depression, unspecified: Secondary | ICD-10-CM

## 2014-01-06 DIAGNOSIS — M129 Arthropathy, unspecified: Secondary | ICD-10-CM | POA: Insufficient documentation

## 2014-01-06 DIAGNOSIS — M109 Gout, unspecified: Secondary | ICD-10-CM

## 2014-01-06 DIAGNOSIS — J45909 Unspecified asthma, uncomplicated: Secondary | ICD-10-CM | POA: Insufficient documentation

## 2014-01-06 DIAGNOSIS — F329 Major depressive disorder, single episode, unspecified: Secondary | ICD-10-CM

## 2014-01-06 DIAGNOSIS — F411 Generalized anxiety disorder: Secondary | ICD-10-CM

## 2014-01-06 DIAGNOSIS — F3289 Other specified depressive episodes: Secondary | ICD-10-CM

## 2014-01-06 DIAGNOSIS — Z79899 Other long term (current) drug therapy: Secondary | ICD-10-CM | POA: Insufficient documentation

## 2014-01-06 DIAGNOSIS — F419 Anxiety disorder, unspecified: Secondary | ICD-10-CM

## 2014-01-06 DIAGNOSIS — R7301 Impaired fasting glucose: Secondary | ICD-10-CM | POA: Insufficient documentation

## 2014-01-06 DIAGNOSIS — I10 Essential (primary) hypertension: Secondary | ICD-10-CM | POA: Insufficient documentation

## 2014-01-06 MED ORDER — LOSARTAN POTASSIUM-HCTZ 100-25 MG PO TABS: 1.0000 | ORAL_TABLET | Freq: Every day | ORAL | Status: DC

## 2014-01-06 MED ORDER — CLONIDINE HCL 0.1 MG PO TABS
0.1000 mg | ORAL_TABLET | Freq: Once | ORAL | Status: AC
  Administered 2014-01-06: 0.1 mg via ORAL

## 2014-01-06 NOTE — Patient Instructions (Signed)
DASH Diet  The DASH diet stands for "Dietary Approaches to Stop Hypertension." It is a healthy eating plan that has been shown to reduce high blood pressure (hypertension) in as little as 14 days, while also possibly providing other significant health benefits. These other health benefits include reducing the risk of breast cancer after menopause and reducing the risk of type 2 diabetes, heart disease, colon cancer, and stroke. Health benefits also include weight loss and slowing kidney failure in patients with chronic kidney disease.   DIET GUIDELINES  · Limit salt (sodium). Your diet should contain less than 1500 mg of sodium daily.  · Limit refined or processed carbohydrates. Your diet should include mostly whole grains. Desserts and added sugars should be used sparingly.  · Include small amounts of heart-healthy fats. These types of fats include nuts, oils, and tub margarine. Limit saturated and trans fats. These fats have been shown to be harmful in the body.  CHOOSING FOODS   The following food groups are based on a 2000 calorie diet. See your Registered Dietitian for individual calorie needs.  Grains and Grain Products (6 to 8 servings daily)  · Eat More Often: Whole-wheat bread, brown rice, whole-grain or wheat pasta, quinoa, popcorn without added fat or salt (air popped).  · Eat Less Often: White bread, white pasta, white rice, cornbread.  Vegetables (4 to 5 servings daily)  · Eat More Often: Fresh, frozen, and canned vegetables. Vegetables may be raw, steamed, roasted, or grilled with a minimal amount of fat.  · Eat Less Often/Avoid: Creamed or fried vegetables. Vegetables in a cheese sauce.  Fruit (4 to 5 servings daily)  · Eat More Often: All fresh, canned (in natural juice), or frozen fruits. Dried fruits without added sugar. One hundred percent fruit juice (½ cup [237 mL] daily).  · Eat Less Often: Dried fruits with added sugar. Canned fruit in light or heavy syrup.  Lean Meats, Fish, and Poultry (2  servings or less daily. One serving is 3 to 4 oz [85-114 g]).  · Eat More Often: Ninety percent or leaner ground beef, tenderloin, sirloin. Round cuts of beef, chicken breast, turkey breast. All fish. Grill, bake, or broil your meat. Nothing should be fried.  · Eat Less Often/Avoid: Fatty cuts of meat, turkey, or chicken leg, thigh, or wing. Fried cuts of meat or fish.  Dairy (2 to 3 servings)  · Eat More Often: Low-fat or fat-free milk, low-fat plain or light yogurt, reduced-fat or part-skim cheese.  · Eat Less Often/Avoid: Milk (whole, 2%). Whole milk yogurt. Full-fat cheeses.  Nuts, Seeds, and Legumes (4 to 5 servings per week)  · Eat More Often: All without added salt.  · Eat Less Often/Avoid: Salted nuts and seeds, canned beans with added salt.  Fats and Sweets (limited)  · Eat More Often: Vegetable oils, tub margarines without trans fats, sugar-free gelatin. Mayonnaise and salad dressings.  · Eat Less Often/Avoid: Coconut oils, palm oils, butter, stick margarine, cream, half and half, cookies, candy, pie.  FOR MORE INFORMATION  The Dash Diet Eating Plan: www.dashdiet.org  Document Released: 08/01/2011 Document Revised: 11/04/2011 Document Reviewed: 08/01/2011  ExitCare® Patient Information ©2014 [employer].

## 2014-01-06 NOTE — Progress Notes (Signed)
MRN: [id number] Name: Anita BruinsMaryanne Russell  Sex: female Age: 58 y.o. DOB: 12/19/1955  Allergies: Meloxicam  Chief Complaint  Patient presents with  . Follow-up    HPI: Patient is 58 y.o. female who has history of hypertension hypothyroidism gout arthritis anxiety/depression comes today for followup today her blood pressure is elevated, as per patient she is compliant with her medications denies any headache dizziness chest and shortness of breath but reported to have chronic tinnitus and reported hearing reduced in right ear and would like to see a specialist, she had a blood work done which was reviewed with the patient noticed to have impaired fasting glucose, history of gout her patient is trying to control the diet uric acid is borderline elevated, her TSH level is within normal range currently on levothyroxine, she was referred to rheumatologist had a blood work done and was told she has arthritis but does not have rheumatoid arthritis her blood work was negative for RF factor.  Past Medical History  Diagnosis Date  . Gout   . Hypertension   . Thyroid disease   . Mental disorder   . Depression   . Asthma   . Hypothyroidism     Past Surgical History  Procedure Laterality Date  . Appendectomy    . Abdominal hysterectomy        Medication List       This list is accurate as of: 01/06/14 10:59 AM.  Always use your most recent med list.               albuterol 108 (90 BASE) MCG/ACT inhaler  Commonly known as:  PROVENTIL HFA;VENTOLIN HFA  Inhale 2 puffs into the lungs every 6 (six) hours as needed. For shortness of breath     busPIRone 10 MG tablet  Commonly known as:  BUSPAR  Take 1 tablet (10 mg total) by mouth daily.     clindamycin 300 MG capsule  Commonly known as:  CLEOCIN  Take 1 capsule (300 mg total) by mouth 4 (four) times daily. X 7 days     levothyroxine 100 MCG tablet  Commonly known as:  SYNTHROID, LEVOTHROID  Take 1 tablet (100 mcg total) by mouth  daily before breakfast. For thyroid disease.     losartan-hydrochlorothiazide 100-25 MG per tablet  Commonly known as:  HYZAAR  Take 1 tablet by mouth daily.     metoprolol 100 MG tablet  Commonly known as:  LOPRESSOR  Take 1 tablet (100 mg total) by mouth 2 (two) times daily.     mupirocin nasal ointment 2 %  Commonly known as:  BACTROBAN  Apply in each nostril daily     predniSONE 20 MG tablet  Commonly known as:  DELTASONE  2 tabs po daily x 3 days     sertraline 100 MG tablet  Commonly known as:  ZOLOFT  Take 1 tablet (100 mg total) by mouth daily. For anxiety and depression.        Meds ordered this encounter  Medications  . cloNIDine (CATAPRES) tablet 0.1 mg    Sig:   . losartan-hydrochlorothiazide (HYZAAR) 100-25 MG per tablet    Sig: Take 1 tablet by mouth daily.    Dispense:  90 tablet    Refill:  3    There is no immunization history for the selected administration types on file for this patient.  Family History  Problem Relation Age of Onset  . Arthritis Father     History  Substance  Use Topics  . Smoking status: Current Every Day Smoker -- 1.00 packs/day    Types: Cigarettes  . Smokeless tobacco: Not on file  . Alcohol Use: No    Review of Systems   As noted in HPI  Filed Vitals:   01/06/14 1059  BP: 150/90  Pulse:   Temp:   Resp:     Physical Exam  Physical Exam  Constitutional: No distress.  Eyes: EOM are normal. Pupils are equal, round, and reactive to light.  Neck: Neck supple.  Cardiovascular: Normal rate.   Pulmonary/Chest: Breath sounds normal. No respiratory distress. She has no wheezes. She has no rales.  Musculoskeletal: She exhibits no edema.  Neurological: She has normal reflexes.    CBC    Component Value Date/Time   WBC 4.8 10/29/2013 1042   RBC 4.80 10/29/2013 1042   HGB 14.6 10/29/2013 1042   HCT 41.9 10/29/2013 1042   PLT 233 10/29/2013 1042   MCV 87.3 10/29/2013 1042   LYMPHSABS 1.8 10/29/2013 1042   MONOABS 0.4  10/29/2013 1042   EOSABS 0.1 10/29/2013 1042   BASOSABS 0.0 10/29/2013 1042    CMP     Component Value Date/Time   NA 137 10/29/2013 1042   K 4.4 10/29/2013 1042   CL 98 10/29/2013 1042   CO2 30 10/29/2013 1042   GLUCOSE 107* 10/29/2013 1042   BUN 14 10/29/2013 1042   CREATININE 0.72 10/29/2013 1042   CREATININE 0.61 11/26/2012 2210   CALCIUM 9.5 10/29/2013 1042   PROT 7.3 10/29/2013 1042   ALBUMIN 4.3 10/29/2013 1042   AST 18 10/29/2013 1042   ALT 14 10/29/2013 1042   ALKPHOS 51 10/29/2013 1042   BILITOT 1.0 10/29/2013 1042   GFRNONAA >90 11/26/2012 2210   GFRAA >90 11/26/2012 2210    Lab Results  Component Value Date/Time   CHOL 188 10/29/2013 10:42 AM    No components found with this basename: hga1c    Lab Results  Component Value Date/Time   AST 18 10/29/2013 10:42 AM    Assessment and Plan  Hypertension/uncontrolled - Plan: She is given cloNIDine (CATAPRES) tablet 0.1 mg in the office, her repeat blood pressure is 150/90, continue with the metoprolol, have increased the dose of losartan-hydrochlorothiazide (HYZAAR) to 100-25 MG per tablet, will repeat blood chemistry on the next visit.  Hypothyroid TSH level is within normal range continue with levothyroxine 100 mcg daily  Anxiety/Depression  Symptoms controlled patient is on BuSpar and her Zoloft.  Tinnitus/Hearing reduced - Plan: Ambulatory referral to ENT  Gout Borderline elevated uric acid, emphasized low protein diet and avoid alcohol   IFG (impaired fasting glucose) Advise for low carbohydrate diet.  Return in about 3 months (around 04/08/2014) for hypertension, hypothyroid.  Doris Cheadleeepak Lani Mendiola, MD

## 2014-01-06 NOTE — Progress Notes (Signed)
Patient here for follow up on her hypertension and arthritis

## 2014-01-26 ENCOUNTER — Ambulatory Visit: Payer: No Typology Code available for payment source | Attending: Internal Medicine

## 2014-04-08 ENCOUNTER — Encounter: Payer: Self-pay | Admitting: Internal Medicine

## 2014-04-08 ENCOUNTER — Ambulatory Visit: Payer: No Typology Code available for payment source | Attending: Internal Medicine | Admitting: Internal Medicine

## 2014-04-08 VITALS — BP 131/79 | HR 64 | Temp 98.2°F | Resp 16 | Ht 69.0 in | Wt 184.0 lb

## 2014-04-08 DIAGNOSIS — W57XXXA Bitten or stung by nonvenomous insect and other nonvenomous arthropods, initial encounter: Secondary | ICD-10-CM

## 2014-04-08 DIAGNOSIS — F17200 Nicotine dependence, unspecified, uncomplicated: Secondary | ICD-10-CM

## 2014-04-08 DIAGNOSIS — I10 Essential (primary) hypertension: Secondary | ICD-10-CM

## 2014-04-08 DIAGNOSIS — R7301 Impaired fasting glucose: Secondary | ICD-10-CM

## 2014-04-08 DIAGNOSIS — T148 Other injury of unspecified body region: Secondary | ICD-10-CM

## 2014-04-08 DIAGNOSIS — R21 Rash and other nonspecific skin eruption: Secondary | ICD-10-CM

## 2014-04-08 DIAGNOSIS — F329 Major depressive disorder, single episode, unspecified: Secondary | ICD-10-CM

## 2014-04-08 DIAGNOSIS — F32A Depression, unspecified: Secondary | ICD-10-CM

## 2014-04-08 DIAGNOSIS — F3289 Other specified depressive episodes: Secondary | ICD-10-CM

## 2014-04-08 DIAGNOSIS — E038 Other specified hypothyroidism: Secondary | ICD-10-CM

## 2014-04-08 MED ORDER — BUSPIRONE HCL 10 MG PO TABS: 10.0000 mg | ORAL_TABLET | Freq: Two times a day (BID) | ORAL | Status: DC

## 2014-04-08 MED ORDER — SULFAMETHOXAZOLE-TMP DS 800-160 MG PO TABS: 1.0000 | ORAL_TABLET | Freq: Two times a day (BID) | ORAL | Status: DC

## 2014-04-08 NOTE — Patient Instructions (Signed)
Diabetes Mellitus and Food It is important for you to manage your blood sugar (glucose) level. Your blood glucose level can be greatly affected by what you eat. Eating healthier foods in the appropriate amounts throughout the day at about the same time each day will help you control your blood glucose level. It can also help slow or prevent worsening of your diabetes mellitus. Healthy eating may even help you improve the level of your blood pressure and reach or maintain a healthy weight.  HOW CAN FOOD AFFECT ME? Carbohydrates Carbohydrates affect your blood glucose level more than any other type of food. Your dietitian will help you determine how many carbohydrates to eat at each meal and teach you how to count carbohydrates. Counting carbohydrates is important to keep your blood glucose at a healthy level, especially if you are using insulin or taking certain medicines for diabetes mellitus. Alcohol Alcohol can cause sudden decreases in blood glucose (hypoglycemia), especially if you use insulin or take certain medicines for diabetes mellitus. Hypoglycemia can be a life-threatening condition. Symptoms of hypoglycemia (sleepiness, dizziness, and disorientation) are similar to symptoms of having too much alcohol.  If your health care provider has given you approval to drink alcohol, do so in moderation and use the following guidelines:  Women should not have more than one drink per day, and men should not have more than two drinks per day. One drink is equal to:  12 oz of beer.  5 oz of wine.  1 oz of hard liquor.  Do not drink on an empty stomach.  Keep yourself hydrated. Have water, diet soda, or unsweetened iced tea.  Regular soda, juice, and other mixers might contain a lot of carbohydrates and should be counted. WHAT FOODS ARE NOT RECOMMENDED? As you make food choices, it is important to remember that all foods are not the same. Some foods have fewer nutrients per serving than other  foods, even though they might have the same number of calories or carbohydrates. It is difficult to get your body what it needs when you eat foods with fewer nutrients. Examples of foods that you should avoid that are high in calories and carbohydrates but low in nutrients include:  Trans fats (most processed foods list trans fats on the Nutrition Facts label).  Regular soda.  Juice.  Candy.  Sweets, such as cake, pie, doughnuts, and cookies.  Fried foods. WHAT FOODS CAN I EAT? Have nutrient-rich foods, which will nourish your body and keep you healthy. The food you should eat also will depend on several factors, including:  The calories you need.  The medicines you take.  Your weight.  Your blood glucose level.  Your blood pressure level.  Your cholesterol level. You also should eat a variety of foods, including:  Protein, such as meat, poultry, fish, tofu, nuts, and seeds (lean animal proteins are best).  Fruits.  Vegetables.  Dairy products, such as milk, cheese, and yogurt (low fat is best).  Breads, grains, pasta, cereal, rice, and beans.  Fats such as olive oil, trans fat-free margarine, canola oil, avocado, and olives. DOES EVERYONE WITH DIABETES MELLITUS HAVE THE SAME MEAL PLAN? Because every person with diabetes mellitus is different, there is not one meal plan that works for everyone. It is very important that you meet with a dietitian who will help you create a meal plan that is just right for you. Document Released: 05/09/2005 Document Revised: 08/17/2013 Document Reviewed: 07/09/2013 ExitCare Patient Information 2015 [employer]. This   information is not intended to replace advice given to you by your health care provider. Make sure you discuss any questions you have with your health care provider.  

## 2014-04-08 NOTE — Progress Notes (Signed)
Patient here for 3 month follow up on hypertension and thyroid issues.

## 2014-04-08 NOTE — Progress Notes (Signed)
MRN: [id number] Name: Anita Russell  Sex: female Age: 58 y.o. DOB: March 26, 1956  Allergies: Meloxicam  Chief Complaint  Patient presents with  . Hypertension  . Hyperthyroidism    HPI: Patient is 58 y.o. female who has history of hypertension hypothyroidism, anxiety, depression comes today for followup, on the last visit I increased the dose of her blood pressure medication, today her blood pressure is improved her denies any headache dizziness chest and shortness of breath, as per patient she has been on Zoloft and BuSpar for several years, she is requesting increasing the dose of medication since it was not helping, she denies any SI or HI, she still smokes cigarettes I have advised patient to quit smoking, she also reported to have rash/bug bites in her left leg denies any fever chills.  Past Medical History  Diagnosis Date  . Gout   . Hypertension   . Thyroid disease   . Mental disorder   . Depression   . Asthma   . Hypothyroidism     Past Surgical History  Procedure Laterality Date  . Appendectomy    . Abdominal hysterectomy        Medication List       This list is accurate as of: 04/08/14 11:06 AM.  Always use your most recent med list.               albuterol 108 (90 BASE) MCG/ACT inhaler  Commonly known as:  PROVENTIL HFA;VENTOLIN HFA  Inhale 2 puffs into the lungs every 6 (six) hours as needed. For shortness of breath     busPIRone 10 MG tablet  Commonly known as:  BUSPAR  Take 1 tablet (10 mg total) by mouth 2 (two) times daily.     clindamycin 300 MG capsule  Commonly known as:  CLEOCIN  Take 1 capsule (300 mg total) by mouth 4 (four) times daily. X 7 days     levothyroxine 100 MCG tablet  Commonly known as:  SYNTHROID, LEVOTHROID  Take 1 tablet (100 mcg total) by mouth daily before breakfast. For thyroid disease.     losartan-hydrochlorothiazide 100-25 MG per tablet  Commonly known as:  HYZAAR  Take 1 tablet by mouth daily.     metoprolol 100 MG tablet  Commonly known as:  LOPRESSOR  Take 1 tablet (100 mg total) by mouth 2 (two) times daily.     mupirocin nasal ointment 2 %  Commonly known as:  BACTROBAN  Apply in each nostril daily     predniSONE 20 MG tablet  Commonly known as:  DELTASONE  2 tabs po daily x 3 days     sertraline 100 MG tablet  Commonly known as:  ZOLOFT  Take 1 tablet (100 mg total) by mouth daily. For anxiety and depression.     sulfamethoxazole-trimethoprim 800-160 MG per tablet  Commonly known as:  BACTRIM DS  Take 1 tablet by mouth 2 (two) times daily.        Meds ordered this encounter  Medications  . busPIRone (BUSPAR) 10 MG tablet    Sig: Take 1 tablet (10 mg total) by mouth 2 (two) times daily.    Dispense:  60 tablet    Refill:  4  . sulfamethoxazole-trimethoprim (BACTRIM DS) 800-160 MG per tablet    Sig: Take 1 tablet by mouth 2 (two) times daily.    Dispense:  20 tablet    Refill:  0    There is no immunization history for the selected  administration types on file for this patient.  Family History  Problem Relation Age of Onset  . Arthritis Father     History  Substance Use Topics  . Smoking status: Current Every Day Smoker -- 1.00 packs/day    Types: Cigarettes  . Smokeless tobacco: Not on file  . Alcohol Use: No    Review of Systems   As noted in HPI  Filed Vitals:   04/08/14 1026  BP: 131/79  Pulse: 64  Temp: 98.2 F (36.8 C)  Resp: 16    Physical Exam  Physical Exam  Constitutional: No distress.  Eyes: EOM are normal. Pupils are equal, round, and reactive to light.  Neck: Neck supple.  Cardiovascular: Normal rate and regular rhythm.   Pulmonary/Chest: Breath sounds normal. No respiratory distress. She has no wheezes. She has no rales.  Skin:  Left leg scattered rash     CBC    Component Value Date/Time   WBC 4.8 10/29/2013 1042   RBC 4.80 10/29/2013 1042   HGB 14.6 10/29/2013 1042   HCT 41.9 10/29/2013 1042   PLT 233 10/29/2013 1042     MCV 87.3 10/29/2013 1042   LYMPHSABS 1.8 10/29/2013 1042   MONOABS 0.4 10/29/2013 1042   EOSABS 0.1 10/29/2013 1042   BASOSABS 0.0 10/29/2013 1042    CMP     Component Value Date/Time   NA 137 10/29/2013 1042   K 4.4 10/29/2013 1042   CL 98 10/29/2013 1042   CO2 30 10/29/2013 1042   GLUCOSE 107* 10/29/2013 1042   BUN 14 10/29/2013 1042   CREATININE 0.72 10/29/2013 1042   CREATININE 0.61 11/26/2012 2210   CALCIUM 9.5 10/29/2013 1042   PROT 7.3 10/29/2013 1042   ALBUMIN 4.3 10/29/2013 1042   AST 18 10/29/2013 1042   ALT 14 10/29/2013 1042   ALKPHOS 51 10/29/2013 1042   BILITOT 1.0 10/29/2013 1042   GFRNONAA >90 11/26/2012 2210   GFRAA >90 11/26/2012 2210    Lab Results  Component Value Date/Time   CHOL 188 10/29/2013 10:42 AM    No components found with this basename: hga1c    Lab Results  Component Value Date/Time   AST 18 10/29/2013 10:42 AM    Assessment and Plan  Essential hypertension Blood pressure is improved her continue with lisinopril/hydrochlorothiazide, metoprolol, advised for DASH diet. Her  Other specified hypothyroidism Last TSH level is in normal range continue with levothyroxine 100 mcg daily  IFG (impaired fasting glucose) Advised patient for low carbohydrate diet.  Depression - Plan: Continue with Zoloft 100 mg daily, I have increased the dose of BuSpar 10 mg twice a day, if she has persistent symptoms then we'll consider referral to psychiatry. busPIRone (BUSPAR) 10 MG tablet  Smoking Advised patient to quit smoking  Bug bites/Rash and nonspecific skin eruption - Plan: sulfamethoxazole-trimethoprim (BACTRIM DS) 800-160 MG per tablet    Return in about 3 months (around 07/09/2014) for hypertension, depression.  Doris CheadleADVANI, Zaharah Amir, MD

## 2014-04-25 ENCOUNTER — Other Ambulatory Visit: Payer: Self-pay | Admitting: Internal Medicine

## 2014-04-25 DIAGNOSIS — I10 Essential (primary) hypertension: Secondary | ICD-10-CM

## 2014-04-25 DIAGNOSIS — F329 Major depressive disorder, single episode, unspecified: Secondary | ICD-10-CM

## 2014-04-25 DIAGNOSIS — F32A Depression, unspecified: Secondary | ICD-10-CM

## 2014-05-16 ENCOUNTER — Other Ambulatory Visit: Payer: Self-pay | Admitting: Internal Medicine

## 2014-05-16 ENCOUNTER — Telehealth: Payer: Self-pay | Admitting: Internal Medicine

## 2014-05-16 NOTE — Telephone Encounter (Signed)
Patient has come in today to request a medication refill for levothyroxine (SYNTHROID, LEVOTHROID) 100 MCG tablet; patient states that she did not ask for a refill on the last visit but has been on this medication for some time and believed them to be auto refill; please f/u with patient about status of refills;

## 2014-05-17 ENCOUNTER — Telehealth: Payer: Self-pay | Admitting: *Deleted

## 2014-05-17 MED ORDER — LEVOTHYROXINE SODIUM 100 MCG PO TABS: 100.0000 ug | ORAL_TABLET | Freq: Every day | ORAL | Status: DC

## 2014-05-17 NOTE — Telephone Encounter (Signed)
Left message informing pt that her medication was refilled.

## 2014-05-18 ENCOUNTER — Ambulatory Visit: Payer: No Typology Code available for payment source | Attending: Internal Medicine

## 2014-06-13 ENCOUNTER — Ambulatory Visit: Payer: No Typology Code available for payment source | Attending: Internal Medicine | Admitting: *Deleted

## 2014-06-13 DIAGNOSIS — Z599 Problem related to housing and economic circumstances, unspecified: Secondary | ICD-10-CM

## 2014-06-13 NOTE — Progress Notes (Signed)
Patient presented upset about her current living situation.  Patient stated that she was asked to leave where she is living.  Patient stated that she felt that she would be okay to stay there if she was able to contribute more.  Clinician encouraged patient to carefully look at her contributions and share those with her roommate and negotiate a move out date.  Patient was agreeable to this process and identified that this supported her anxiety.  Clinician also encouraged patient to follow up with the employment security commission and the multiple temporary staffing agencies in the area. Patient will follow up if she needs any other services or supports.  Beverly Sessionsywan J Tyri Elmore MSW, LCSW

## 2014-09-29 ENCOUNTER — Other Ambulatory Visit: Payer: Self-pay | Admitting: Internal Medicine

## 2014-10-03 ENCOUNTER — Ambulatory Visit: Payer: No Typology Code available for payment source | Attending: Internal Medicine | Admitting: Internal Medicine

## 2014-10-03 ENCOUNTER — Encounter: Payer: Self-pay | Admitting: Internal Medicine

## 2014-10-03 VITALS — BP 139/87 | HR 65 | Temp 98.8°F | Resp 16 | Wt 190.0 lb

## 2014-10-03 DIAGNOSIS — Z720 Tobacco use: Secondary | ICD-10-CM

## 2014-10-03 DIAGNOSIS — J45909 Unspecified asthma, uncomplicated: Secondary | ICD-10-CM | POA: Insufficient documentation

## 2014-10-03 DIAGNOSIS — E039 Hypothyroidism, unspecified: Secondary | ICD-10-CM | POA: Insufficient documentation

## 2014-10-03 DIAGNOSIS — Z23 Encounter for immunization: Secondary | ICD-10-CM | POA: Insufficient documentation

## 2014-10-03 DIAGNOSIS — I10 Essential (primary) hypertension: Secondary | ICD-10-CM | POA: Insufficient documentation

## 2014-10-03 DIAGNOSIS — F17200 Nicotine dependence, unspecified, uncomplicated: Secondary | ICD-10-CM

## 2014-10-03 DIAGNOSIS — F17210 Nicotine dependence, cigarettes, uncomplicated: Secondary | ICD-10-CM | POA: Insufficient documentation

## 2014-10-03 DIAGNOSIS — Z792 Long term (current) use of antibiotics: Secondary | ICD-10-CM | POA: Insufficient documentation

## 2014-10-03 DIAGNOSIS — F329 Major depressive disorder, single episode, unspecified: Secondary | ICD-10-CM | POA: Insufficient documentation

## 2014-10-03 DIAGNOSIS — F32A Depression, unspecified: Secondary | ICD-10-CM

## 2014-10-03 LAB — COMPLETE METABOLIC PANEL WITH GFR
ALBUMIN: 4.2 g/dL (ref 3.5–5.2)
ALT: 16 U/L (ref 0–35)
AST: 15 U/L (ref 0–37)
Alkaline Phosphatase: 55 U/L (ref 39–117)
BILIRUBIN TOTAL: 0.4 mg/dL (ref 0.2–1.2)
BUN: 28 mg/dL — ABNORMAL HIGH (ref 6–23)
CALCIUM: 9.5 mg/dL (ref 8.4–10.5)
CHLORIDE: 101 meq/L (ref 96–112)
CO2: 29 meq/L (ref 19–32)
Creat: 0.76 mg/dL (ref 0.50–1.10)
GFR, Est Non African American: 87 mL/min
Glucose, Bld: 92 mg/dL (ref 70–99)
POTASSIUM: 4.7 meq/L (ref 3.5–5.3)
SODIUM: 139 meq/L (ref 135–145)
TOTAL PROTEIN: 7.1 g/dL (ref 6.0–8.3)

## 2014-10-03 MED ORDER — LOSARTAN POTASSIUM-HCTZ 100-25 MG PO TABS: 1.0000 | ORAL_TABLET | Freq: Every day | ORAL | Status: DC

## 2014-10-03 MED ORDER — METOPROLOL TARTRATE 100 MG PO TABS: 100.0000 mg | ORAL_TABLET | Freq: Two times a day (BID) | ORAL | Status: DC

## 2014-10-03 MED ORDER — SERTRALINE HCL 100 MG PO TABS: ORAL_TABLET | ORAL | Status: DC

## 2014-10-03 MED ORDER — LEVOTHYROXINE SODIUM 100 MCG PO TABS: 100.0000 ug | ORAL_TABLET | Freq: Every day | ORAL | Status: DC

## 2014-10-03 NOTE — Progress Notes (Signed)
MRN: [id number] Name: Anita Russell  Sex: female Age: 59 y.o. DOB: 1955-12-12  Allergies: Meloxicam  Chief Complaint  Patient presents with  . Follow-up    HPI: Patient is 59 y.o. female who has to of hypertension, hypothyroidism, depression comes today for followup and requesting refill on her medications, she denies any acute symptoms denies any headache dizziness chest and shortness of breath, patient is to smoke cigarettes, I counseled patient to quit smoking.   Past Medical History  Diagnosis Date  . Gout   . Hypertension   . Thyroid disease   . Mental disorder   . Depression   . Asthma   . Hypothyroidism     Past Surgical History  Procedure Laterality Date  . Appendectomy    . Abdominal hysterectomy        Medication List       This list is accurate as of: 10/03/14  2:55 PM.  Always use your most recent med list.               albuterol 108 (90 BASE) MCG/ACT inhaler  Commonly known as:  PROVENTIL HFA;VENTOLIN HFA  Inhale 2 puffs into the lungs every 6 (six) hours as needed. For shortness of breath     busPIRone 10 MG tablet  Commonly known as:  BUSPAR  Take 1 tablet (10 mg total) by mouth 2 (two) times daily.     clindamycin 300 MG capsule  Commonly known as:  CLEOCIN  Take 1 capsule (300 mg total) by mouth 4 (four) times daily. X 7 days     levothyroxine 100 MCG tablet  Commonly known as:  SYNTHROID, LEVOTHROID  Take 1 tablet (100 mcg total) by mouth daily before breakfast. For thyroid disease.     losartan-hydrochlorothiazide 100-25 MG per tablet  Commonly known as:  HYZAAR  Take 1 tablet by mouth daily.     metoprolol 100 MG tablet  Commonly known as:  LOPRESSOR  Take 1 tablet (100 mg total) by mouth 2 (two) times daily.     mupirocin nasal ointment 2 %  Commonly known as:  BACTROBAN  Apply in each nostril daily     predniSONE 20 MG tablet  Commonly known as:  DELTASONE  2 tabs po daily x 3 days     sertraline 100 MG tablet    Commonly known as:  ZOLOFT  TAKE 1 TABLET BY MOUTH ONCE DAILY FOR ANXIETY AND DEPRESSION     sulfamethoxazole-trimethoprim 800-160 MG per tablet  Commonly known as:  BACTRIM DS  Take 1 tablet by mouth 2 (two) times daily.        Meds ordered this encounter  Medications  . levothyroxine (SYNTHROID, LEVOTHROID) 100 MCG tablet    Sig: Take 1 tablet (100 mcg total) by mouth daily before breakfast. For thyroid disease.    Dispense:  30 tablet    Refill:  4  . losartan-hydrochlorothiazide (HYZAAR) 100-25 MG per tablet    Sig: Take 1 tablet by mouth daily.    Dispense:  90 tablet    Refill:  3  . sertraline (ZOLOFT) 100 MG tablet    Sig: TAKE 1 TABLET BY MOUTH ONCE DAILY FOR ANXIETY AND DEPRESSION    Dispense:  30 tablet    Refill:  4  . metoprolol (LOPRESSOR) 100 MG tablet    Sig: Take 1 tablet (100 mg total) by mouth 2 (two) times daily.    Dispense:  60 tablet    Refill:  3    Immunization History  Administered Date(s) Administered  . Pneumococcal Polysaccharide-23 10/03/2014    Family History  Problem Relation Age of Onset  . Arthritis Father     History  Substance Use Topics  . Smoking status: Current Every Day Smoker -- 1.00 packs/day    Types: Cigarettes  . Smokeless tobacco: Not on file  . Alcohol Use: No    Review of Systems   As noted in HPI  Filed Vitals:   10/03/14 1420  BP: 139/87  Pulse: 65  Temp: 98.8 F (37.1 C)  Resp: 16    Physical Exam  Physical Exam  Constitutional: No distress.  Eyes: EOM are normal. Pupils are equal, round, and reactive to light.  Cardiovascular: Normal rate and regular rhythm.   Pulmonary/Chest: Breath sounds normal. No respiratory distress. She has no wheezes. She has no rales.  Musculoskeletal: She exhibits no edema.    CBC    Component Value Date/Time   WBC 4.8 10/29/2013 1042   RBC 4.80 10/29/2013 1042   HGB 14.6 10/29/2013 1042   HCT 41.9 10/29/2013 1042   PLT 233 10/29/2013 1042   MCV 87.3  10/29/2013 1042   LYMPHSABS 1.8 10/29/2013 1042   MONOABS 0.4 10/29/2013 1042   EOSABS 0.1 10/29/2013 1042   BASOSABS 0.0 10/29/2013 1042    CMP     Component Value Date/Time   NA 137 10/29/2013 1042   K 4.4 10/29/2013 1042   CL 98 10/29/2013 1042   CO2 30 10/29/2013 1042   GLUCOSE 107* 10/29/2013 1042   BUN 14 10/29/2013 1042   CREATININE 0.72 10/29/2013 1042   CREATININE 0.61 11/26/2012 2210   CALCIUM 9.5 10/29/2013 1042   PROT 7.3 10/29/2013 1042   ALBUMIN 4.3 10/29/2013 1042   AST 18 10/29/2013 1042   ALT 14 10/29/2013 1042   ALKPHOS 51 10/29/2013 1042   BILITOT 1.0 10/29/2013 1042   GFRNONAA >90 11/26/2012 2210   GFRAA >90 11/26/2012 2210    Lab Results  Component Value Date/Time   CHOL 188 10/29/2013 10:42 AM    No components found for: HGA1C  Lab Results  Component Value Date/Time   AST 18 10/29/2013 10:42 AM    Assessment and Plan  Need for prophylactic vaccination against Streptococcus pneumoniae (pneumococcus) - Plan: Pneumococcal polysaccharide vaccine 23-valent greater than or equal to 2yo subcutaneous/IM  Essential hypertension - Plan:continue with current meds, will check blood chemistry  losartan-hydrochlorothiazide (HYZAAR) 100-25 MG per tablet, metoprolol (LOPRESSOR) 100 MG tablet, COMPLETE METABOLIC PANEL WITH GFR  Depression - Plan: Symptoms are stable continue with sertraline (ZOLOFT) 100 MG tablet  Hypothyroidism, unspecified hypothyroidism type - Plan:currently patient is on levothyroxine (SYNTHROID, LEVOTHROID) 100 MCG tablet, , recheck her TSH level  Smoking Again counseled patient to quit smoking.  Health Maintenance -Mammogram: patient is due  In April  -Vaccinations:  Patient declines flu shot, Pneumovax given today.   Return in about 3 months (around 01/01/2015) for hypertension.  Doris CheadleADVANI, Lytle [place], MD

## 2014-10-03 NOTE — Progress Notes (Signed)
Patient here for follow-up and medication refills.

## 2014-10-03 NOTE — Patient Instructions (Addendum)
DASH Eating Plan DASH stands for "Dietary Approaches to Stop Hypertension." The DASH eating plan is a healthy eating plan that has been shown to reduce high blood pressure (hypertension). Additional health benefits may include reducing the risk of type 2 diabetes mellitus, heart disease, and stroke. The DASH eating plan may also help with weight loss. WHAT DO I NEED TO KNOW ABOUT THE DASH EATING PLAN? For the DASH eating plan, you will follow these general guidelines:  Choose foods with a percent daily value for sodium of less than 5% (as listed on the food label).  Use salt-free seasonings or herbs instead of table salt or sea salt.  Check with your health care provider or pharmacist before using salt substitutes.  Eat lower-sodium products, often labeled as "lower sodium" or "no salt added."  Eat fresh foods.  Eat more vegetables, fruits, and low-fat dairy products.  Choose whole grains. Look for the word "whole" as the first word in the ingredient list.  Choose fish and skinless chicken or turkey more often than red meat. Limit fish, poultry, and meat to 6 oz (170 g) each day.  Limit sweets, desserts, sugars, and sugary drinks.  Choose heart-healthy fats.  Limit cheese to 1 oz (28 g) per day.  Eat more home-cooked food and less restaurant, buffet, and fast food.  Limit fried foods.  Cook foods using methods other than frying.  Limit canned vegetables. If you do use them, rinse them well to decrease the sodium.  When eating at a restaurant, ask that your food be prepared with less salt, or no salt if possible. WHAT FOODS CAN I EAT? Seek help from a dietitian for individual calorie needs. Grains Whole grain or whole wheat bread. Brown rice. Whole grain or whole wheat pasta. Quinoa, bulgur, and whole grain cereals. Low-sodium cereals. Corn or whole wheat flour tortillas. Whole grain cornbread. Whole grain crackers. Low-sodium crackers. Vegetables Fresh or frozen vegetables  (raw, steamed, roasted, or grilled). Low-sodium or reduced-sodium tomato and vegetable juices. Low-sodium or reduced-sodium tomato sauce and paste. Low-sodium or reduced-sodium canned vegetables.  Fruits All fresh, canned (in natural juice), or frozen fruits. Meat and Other Protein Products Ground beef (85% or leaner), grass-fed beef, or beef trimmed of fat. Skinless chicken or turkey. Ground chicken or turkey. Pork trimmed of fat. All fish and seafood. Eggs. Dried beans, peas, or lentils. Unsalted nuts and seeds. Unsalted canned beans. Dairy Low-fat dairy products, such as skim or 1% milk, 2% or reduced-fat cheeses, low-fat ricotta or cottage cheese, or plain low-fat yogurt. Low-sodium or reduced-sodium cheeses. Fats and Oils Tub margarines without trans fats. Light or reduced-fat mayonnaise and salad dressings (reduced sodium). Avocado. Safflower, olive, or canola oils. Natural peanut or almond butter. Other Unsalted popcorn and pretzels. The items listed above may not be a complete list of recommended foods or beverages. Contact your dietitian for more options. WHAT FOODS ARE NOT RECOMMENDED? Grains White bread. White pasta. White rice. Refined cornbread. Bagels and croissants. Crackers that contain trans fat. Vegetables Creamed or fried vegetables. Vegetables in a cheese sauce. Regular canned vegetables. Regular canned tomato sauce and paste. Regular tomato and vegetable juices. Fruits Dried fruits. Canned fruit in light or heavy syrup. Fruit juice. Meat and Other Protein Products Fatty cuts of meat. Ribs, chicken wings, bacon, sausage, bologna, salami, chitterlings, fatback, hot dogs, bratwurst, and packaged luncheon meats. Salted nuts and seeds. Canned beans with salt. Dairy Whole or 2% milk, cream, half-and-half, and cream cheese. Whole-fat or sweetened yogurt. Full-fat   cheeses or blue cheese. Nondairy creamers and whipped toppings. Processed cheese, cheese spreads, or cheese  curds. Condiments Onion and garlic salt, seasoned salt, table salt, and sea salt. Canned and packaged gravies. Worcestershire sauce. Tartar sauce. Barbecue sauce. Teriyaki sauce. Soy sauce, including reduced sodium. Steak sauce. Fish sauce. Oyster sauce. Cocktail sauce. Horseradish. Ketchup and mustard. Meat flavorings and tenderizers. Bouillon cubes. Hot sauce. Tabasco sauce. Marinades. Taco seasonings. Relishes. Fats and Oils Butter, stick margarine, lard, shortening, ghee, and bacon fat. Coconut, palm kernel, or palm oils. Regular salad dressings. Other Pickles and olives. Salted popcorn and pretzels. The items listed above may not be a complete list of foods and beverages to avoid. Contact your dietitian for more information. WHERE CAN I FIND MORE INFORMATION? National Heart, Lung, and [employer] Institute: www.nhlbi.nih.gov/health/health-topics/topics/dash/ Document Released: 08/01/2011 Document Revised: 12/27/2013 Document Reviewed: 06/16/2013 ExitCare Patient Information 2015 [employer]. This information is not intended to replace advice given to you by your health care provider. Make sure you discuss any questions you have with your health care provider. Smoking Cessation Quitting smoking is important to your health and has many advantages. However, it is not always easy to quit since nicotine is a very addictive drug. Oftentimes, people try 3 times or more before being able to quit. This document explains the best ways for you to prepare to quit smoking. Quitting takes hard work and a lot of effort, but you can do it. ADVANTAGES OF QUITTING SMOKING  You will live longer, feel better, and live better.  Your body will feel the impact of quitting smoking almost immediately.  Within 20 minutes, blood pressure decreases. Your pulse returns to its normal level.  After 8 hours, carbon monoxide levels in the blood return to normal. Your oxygen level increases.  After 24 hours, the chance of  having a heart attack starts to decrease. Your breath, hair, and body stop smelling like smoke.  After 48 hours, damaged nerve endings begin to recover. Your sense of taste and smell improve.  After 72 hours, the body is virtually free of nicotine. Your bronchial tubes relax and breathing becomes easier.  After 2 to 12 weeks, lungs can hold more air. Exercise becomes easier and circulation improves.  The risk of having a heart attack, stroke, cancer, or lung disease is greatly reduced.  After 1 year, the risk of coronary heart disease is cut in half.  After 5 years, the risk of stroke falls to the same as a nonsmoker.  After 10 years, the risk of lung cancer is cut in half and the risk of other cancers decreases significantly.  After 15 years, the risk of coronary heart disease drops, usually to the level of a nonsmoker.  If you are pregnant, quitting smoking will improve your chances of having a healthy baby.  The people you live with, especially any children, will be healthier.  You will have extra money to spend on things other than cigarettes. QUESTIONS TO THINK ABOUT BEFORE ATTEMPTING TO QUIT You may want to talk about your answers with your health care provider.  Why do you want to quit?  If you tried to quit in the past, what helped and what did not?  What will be the most difficult situations for you after you quit? How will you plan to handle them?  Who can help you through the tough times? Your family? Friends? A health care provider?  What pleasures do you get from smoking? What ways can you still get pleasure   if you quit? Here are some questions to ask your health care provider:  How can you help me to be successful at quitting?  What medicine do you think would be best for me and how should I take it?  What should I do if I need more help?  What is smoking withdrawal like? How can I get information on withdrawal? GET READY  Set a quit date.  Change your  environment by getting rid of all cigarettes, ashtrays, matches, and lighters in your home, car, or work. Do not let people smoke in your home.  Review your past attempts to quit. Think about what worked and what did not. GET SUPPORT AND ENCOURAGEMENT You have a better chance of being successful if you have help. You can get support in many ways.  Tell your family, friends, and coworkers that you are going to quit and need their support. Ask them not to smoke around you.  Get individual, group, or telephone counseling and support. Programs are available at local hospitals and health centers. Call your local health department for information about programs in your area.  Spiritual beliefs and practices may help some smokers quit.  Download a "quit meter" on your computer to keep track of quit statistics, such as how long you have gone without smoking, cigarettes not smoked, and money saved.  Get a self-help book about quitting smoking and staying off tobacco. LEARN NEW SKILLS AND BEHAVIORS  Distract yourself from urges to smoke. Talk to someone, go for a walk, or occupy your time with a task.  Change your normal routine. Take a different route to work. Drink tea instead of coffee. Eat breakfast in a different place.  Reduce your stress. Take a hot bath, exercise, or read a book.  Plan something enjoyable to do every day. Reward yourself for not smoking.  Explore interactive web-based programs that specialize in helping you quit. GET MEDICINE AND USE IT CORRECTLY Medicines can help you stop smoking and decrease the urge to smoke. Combining medicine with the above behavioral methods and support can greatly increase your chances of successfully quitting smoking.  Nicotine replacement therapy helps deliver nicotine to your body without the negative effects and risks of smoking. Nicotine replacement therapy includes nicotine gum, lozenges, inhalers, nasal sprays, and skin patches. Some may be  available over-the-counter and others require a prescription.  Antidepressant medicine helps people abstain from smoking, but how this works is unknown. This medicine is available by prescription.  Nicotinic receptor partial agonist medicine simulates the effect of nicotine in your brain. This medicine is available by prescription. Ask your health care provider for advice about which medicines to use and how to use them based on your health history. Your health care provider will tell you what side effects to look out for if you choose to be on a medicine or therapy. Carefully read the information on the package. Do not use any other product containing nicotine while using a nicotine replacement product.  RELAPSE OR DIFFICULT SITUATIONS Most relapses occur within the first 3 months after quitting. Do not be discouraged if you start smoking again. Remember, most people try several times before finally quitting. You may have symptoms of withdrawal because your body is used to nicotine. You may crave cigarettes, be irritable, feel very hungry, cough often, get headaches, or have difficulty concentrating. The withdrawal symptoms are only temporary. They are strongest when you first quit, but they will go away within 10-14 days. To reduce the   chances of relapse, try to:  Avoid drinking alcohol. Drinking lowers your chances of successfully quitting.  Reduce the amount of caffeine you consume. Once you quit smoking, the amount of caffeine in your body increases and can give you symptoms, such as a rapid heartbeat, sweating, and anxiety.  Avoid smokers because they can make you want to smoke.  Do not let weight gain distract you. Many smokers will gain weight when they quit, usually less than 10 pounds. Eat a healthy diet and stay active. You can always lose the weight gained after you quit.  Find ways to improve your mood other than smoking. FOR MORE INFORMATION  www.smokefree.gov  Document Released:  08/06/2001 Document Revised: 12/27/2013 Document Reviewed: 11/21/2011 Exit[employer] Patient Information 2015 [employer]. This information is not intended to replace advice given to you by your health care provider. Make sure you discuss any questions you have with your health care provider.  

## 2014-10-04 ENCOUNTER — Ambulatory Visit: Payer: No Typology Code available for payment source

## 2014-10-04 LAB — TSH: TSH: 1.305 u[IU]/mL (ref 0.350–4.500)

## 2014-10-06 ENCOUNTER — Telehealth: Payer: Self-pay

## 2014-10-06 NOTE — Telephone Encounter (Signed)
-----   Message from Doris Cheadleeepak Advani, MD sent at 10/04/2014  9:19 AM EST ----- Call and let the patient know that her TSH level is in normal range, continue with current dose of levothyroxine 100 mcg daily.

## 2014-10-06 NOTE — Telephone Encounter (Signed)
Patient is aware of her lab results 

## 2014-10-27 ENCOUNTER — Ambulatory Visit: Payer: Self-pay

## 2014-10-27 ENCOUNTER — Ambulatory Visit: Payer: No Typology Code available for payment source | Attending: Internal Medicine

## 2015-01-03 ENCOUNTER — Emergency Department (HOSPITAL_COMMUNITY): Payer: No Typology Code available for payment source

## 2015-01-03 ENCOUNTER — Emergency Department (HOSPITAL_COMMUNITY)
Admission: EM | Admit: 2015-01-03 | Discharge: 2015-01-03 | Disposition: A | Discharge status: Home / Self Care | Payer: No Typology Code available for payment source | Attending: Emergency Medicine | Admitting: Emergency Medicine

## 2015-01-03 ENCOUNTER — Encounter (HOSPITAL_COMMUNITY): Payer: Self-pay | Admitting: *Deleted

## 2015-01-03 DIAGNOSIS — J45909 Unspecified asthma, uncomplicated: Secondary | ICD-10-CM | POA: Insufficient documentation

## 2015-01-03 DIAGNOSIS — M109 Gout, unspecified: Secondary | ICD-10-CM | POA: Insufficient documentation

## 2015-01-03 DIAGNOSIS — I10 Essential (primary) hypertension: Secondary | ICD-10-CM | POA: Insufficient documentation

## 2015-01-03 DIAGNOSIS — F99 Mental disorder, not otherwise specified: Secondary | ICD-10-CM | POA: Insufficient documentation

## 2015-01-03 DIAGNOSIS — Z720 Tobacco use: Secondary | ICD-10-CM | POA: Insufficient documentation

## 2015-01-03 DIAGNOSIS — E039 Hypothyroidism, unspecified: Secondary | ICD-10-CM | POA: Insufficient documentation

## 2015-01-03 DIAGNOSIS — M25571 Pain in right ankle and joints of right foot: Secondary | ICD-10-CM

## 2015-01-03 DIAGNOSIS — Z792 Long term (current) use of antibiotics: Secondary | ICD-10-CM | POA: Insufficient documentation

## 2015-01-03 DIAGNOSIS — Z79899 Other long term (current) drug therapy: Secondary | ICD-10-CM | POA: Insufficient documentation

## 2015-01-03 DIAGNOSIS — F329 Major depressive disorder, single episode, unspecified: Secondary | ICD-10-CM | POA: Insufficient documentation

## 2015-01-03 MED ORDER — INDOMETHACIN 25 MG PO CAPS: 25.0000 mg | ORAL_CAPSULE | Freq: Two times a day (BID) | ORAL | Status: DC

## 2015-01-03 MED ORDER — DEXAMETHASONE SODIUM PHOSPHATE 10 MG/ML IJ SOLN
10.0000 mg | Freq: Once | INTRAMUSCULAR | Status: AC
  Administered 2015-01-03: 10 mg via INTRAMUSCULAR
  Filled 2015-01-03: qty 1

## 2015-01-03 MED ORDER — KETOROLAC TROMETHAMINE 60 MG/2ML IM SOLN
60.0000 mg | Freq: Once | INTRAMUSCULAR | Status: AC
  Administered 2015-01-03: 60 mg via INTRAMUSCULAR
  Filled 2015-01-03: qty 2

## 2015-01-03 NOTE — ED Notes (Signed)
Patient presents with c/o pain to the right ankle.  Denies injury.  States she has a history of gout

## 2015-01-03 NOTE — Discharge Instructions (Signed)
Ankle Pain Ankle pain is a common symptom. The bones, cartilage, tendons, and muscles of the ankle joint perform a lot of work each day. The ankle joint holds your body weight and allows you to move around. Ankle pain can occur on either side or back of 1 or both ankles. Ankle pain may be sharp and burning or dull and aching. There may be tenderness, stiffness, redness, or warmth around the ankle. The pain occurs more often when a person walks or puts pressure on the ankle. CAUSES  There are many reasons ankle pain can develop. It is important to work with your caregiver to identify the cause since many conditions can impact the bones, cartilage, muscles, and tendons. Causes for ankle pain include:  Injury, including a break (fracture), sprain, or strain often due to a fall, sports, or a high-impact activity.  Swelling (inflammation) of a tendon (tendonitis).  Achilles tendon rupture.  Ankle instability after repeated sprains and strains.  Poor foot alignment.  Pressure on a nerve (tarsal tunnel syndrome).  Arthritis in the ankle or the lining of the ankle.  Crystal formation in the ankle (gout or pseudogout). DIAGNOSIS  A diagnosis is based on your medical history, your symptoms, results of your physical exam, and results of diagnostic tests. Diagnostic tests may include X-ray exams or a computerized magnetic scan (magnetic resonance imaging, MRI). TREATMENT  Treatment will depend on the cause of your ankle pain and may include:  Keeping pressure off the ankle and limiting activities.  Using crutches or other walking support (a cane or brace).  Using rest, ice, compression, and elevation.  Participating in physical therapy or home exercises.  Wearing shoe inserts or special shoes.  Losing weight.  Taking medications to reduce pain or swelling or receiving an injection.  Undergoing surgery. HOME CARE INSTRUCTIONS   Only take over-the-counter or prescription medicines for  pain, discomfort, or fever as directed by your caregiver.  Put ice on the injured area.  Put ice in a plastic bag.  Place a towel between your skin and the bag.  Leave the ice on for 15-20 minutes at a time, 03-04 times a day.  Keep your leg raised (elevated) when possible to lessen swelling.  Avoid activities that cause ankle pain.  Follow specific exercises as directed by your caregiver.  Record how often you have ankle pain, the location of the pain, and what it feels like. This information may be helpful to you and your caregiver.  Ask your caregiver about returning to work or sports and whether you should drive.  Follow up with your caregiver for further examination, therapy, or testing as directed. SEEK MEDICAL CARE IF:   Pain or swelling continues or worsens beyond 1 week.  You have an oral temperature above 102 F (38.9 C).  You are feeling unwell or have chills.  You are having an increasingly difficult time with walking.  You have loss of sensation or other new symptoms.  You have questions or concerns. MAKE SURE YOU:   Understand these instructions.  Will watch your condition.  Will get help right away if you are not doing well or get worse. Document Released: 01/30/2010 Document Revised: 11/04/2011 Document Reviewed: 01/30/2010 [facility] Patient Information 2015 [place], [place]. This information is not intended to replace advice given to you by your health care provider. Make sure you discuss any questions you have with your health care provider. Gout Gout is an inflammatory arthritis caused by a buildup of uric acid crystals  in the joints. Uric acid is a chemical that is normally present in the blood. When the level of uric acid in the blood is too high it can form crystals that deposit in your joints and tissues. This causes joint redness, soreness, and swelling (inflammation). Repeat attacks are common. Over time, uric acid crystals can form into masses  (tophi) near a joint, destroying bone and causing disfigurement. Gout is treatable and often preventable. CAUSES  The disease begins with elevated levels of uric acid in the blood. Uric acid is produced by your body when it breaks down a naturally found substance called purines. Certain foods you eat, such as meats and fish, contain high amounts of purines. Causes of an elevated uric acid level include:  Being passed down from parent to child (heredity).  Diseases that cause increased uric acid production (such as obesity, psoriasis, and certain cancers).  Excessive alcohol use.  Diet, especially diets rich in meat and seafood.  Medicines, including certain cancer-fighting medicines (chemotherapy), water pills (diuretics), and aspirin.  Chronic kidney disease. The kidneys are no longer able to remove uric acid well.  Problems with metabolism. Conditions strongly associated with gout include:  Obesity.  High blood pressure.  High cholesterol.  Diabetes. Not everyone with elevated uric acid levels gets gout. It is not understood why some people get gout and others do not. Surgery, joint injury, and eating too much of certain foods are some of the factors that can lead to gout attacks. SYMPTOMS   An attack of gout comes on quickly. It causes intense pain with redness, swelling, and warmth in a joint.  Fever can occur.  Often, only one joint is involved. Certain joints are more commonly involved:  Base of the big toe.  Knee.  Ankle.  Wrist.  Finger. Without treatment, an attack usually goes away in a few days to weeks. Between attacks, you usually will not have symptoms, which is different from many other forms of arthritis. DIAGNOSIS  Your caregiver will suspect gout based on your symptoms and exam. In some cases, tests may be recommended. The tests may include:  Blood tests.  Urine tests.  X-rays.  Joint fluid exam. This exam requires a needle to remove fluid from  the joint (arthrocentesis). Using a microscope, gout is confirmed when uric acid crystals are seen in the joint fluid. TREATMENT  There are two phases to gout treatment: treating the sudden onset (acute) attack and preventing attacks (prophylaxis).  Treatment of an Acute Attack.  Medicines are used. These include anti-inflammatory medicines or steroid medicines.  An injection of steroid medicine into the affected joint is sometimes necessary.  The painful joint is rested. Movement can worsen the arthritis.  You may use warm or cold treatments on painful joints, depending which works best for you.  Treatment to Prevent Attacks.  If you suffer from frequent gout attacks, your caregiver may advise preventive medicine. These medicines are started after the acute attack subsides. These medicines either help your kidneys eliminate uric acid from your body or decrease your uric acid production. You may need to stay on these medicines for a very long time.  The early phase of treatment with preventive medicine can be associated with an increase in acute gout attacks. For this reason, during the first few months of treatment, your caregiver may also advise you to take medicines usually used for acute gout treatment. Be sure you understand your caregiver's directions. Your caregiver may make several adjustments to your medicine  dose before these medicines are effective.  Discuss dietary treatment with your caregiver or dietitian. Alcohol and drinks high in sugar and fructose and foods such as meat, poultry, and seafood can increase uric acid levels. Your caregiver or dietitian can advise you on drinks and foods that should be limited. HOME CARE INSTRUCTIONS   Do not take aspirin to relieve pain. This raises uric acid levels.  Only take over-the-counter or prescription medicines for pain, discomfort, or fever as directed by your caregiver.  Rest the joint as much as possible. When in bed, keep sheets  and blankets off painful areas.  Keep the affected joint raised (elevated).  Apply warm or cold treatments to painful joints. Use of warm or cold treatments depends on which works best for you.  Use crutches if the painful joint is in your leg.  Drink enough fluids to keep your urine clear or pale yellow. This helps your body get rid of uric acid. Limit alcohol, sugary drinks, and fructose drinks.  Follow your dietary instructions. Pay careful attention to the amount of protein you eat. Your daily diet should emphasize fruits, vegetables, whole grains, and fat-free or low-fat milk products. Discuss the use of coffee, vitamin C, and cherries with your caregiver or dietitian. These may be helpful in lowering uric acid levels.  Maintain a healthy body weight. SEEK MEDICAL CARE IF:   You develop diarrhea, vomiting, or any side effects from medicines.  You do not feel better in 24 hours, or you are getting worse. SEEK IMMEDIATE MEDICAL CARE IF:   Your joint becomes suddenly more tender, and you have chills or a fever. MAKE SURE YOU:   Understand these instructions.  Will watch your condition.  Will get help right away if you are not doing well or get worse. Document Released: 08/09/2000 Document Revised: 12/27/2013 Document Reviewed: 03/25/2012 [facility]Exit[facility] Patient Information 2015 [place] ValleyExitCare, [place]LL[place]. This information is not intended to replace advice given to you by your health care provider. Make sure you discuss any questions you have with your health care provider.

## 2015-01-03 NOTE — ED Provider Notes (Signed)
CSN: [id number]     Arrival date & time 01/03/15  [id number] History   First MD Initiated Contact with Patient 01/03/15 0631     Chief Complaint  Patient presents with  . Ankle Pain     (Consider location/radiation/quality/duration/timing/severity/associated sxs/prior Treatment) HPI   Anita BruinsMaryanne Russell is a(n) 59 y.o. female who presents to the ED with CC of left ankle pain. Patient states it began about 2 days ago and was achy. She states that it has worsened progressively and this morning woke her from sleep. She has a history of gout and states that this feels similar. She states she cannot think of any injuries. She's had to the ankle. She has started a new job at which she is on her feet for up to 8 hours a day. She denies any heat, redness or swelling. She states that pain is worse with palpation and movement of the joint. Of note, the patient has erythematous patches of rash on the neck and shoulders, which have been going on for several years. She complains of severe arthritic pain in her joints.  Past Medical History  Diagnosis Date  . Gout   . Hypertension   . Thyroid disease   . Mental disorder   . Depression   . Asthma   . Hypothyroidism    Past Surgical History  Procedure Laterality Date  . Appendectomy    . Abdominal hysterectomy     Family History  Problem Relation Age of Onset  . Arthritis Father    History  Substance Use Topics  . Smoking status: Current Every Day Smoker -- 1.00 packs/day    Types: Cigarettes  . Smokeless tobacco: Never Used  . Alcohol Use: No   OB History    No data available     Review of Systems  Ten systems reviewed and are negative for acute change, except as noted in the HPI.    Allergies  Meloxicam  Home Medications   Prior to Admission medications   Medication Sig Start Date End Date Taking? Authorizing Provider  albuterol (PROVENTIL HFA;VENTOLIN HFA) 108 (90 BASE) MCG/ACT inhaler Inhale 2 puffs into the lungs every 6 (six)  hours as needed. For shortness of breath    Historical Provider, MD  busPIRone (BUSPAR) 10 MG tablet Take 1 tablet (10 mg total) by mouth 2 (two) times daily. 04/08/14   Doris Cheadleeepak Advani, MD  clindamycin (CLEOCIN) 300 MG capsule Take 1 capsule (300 mg total) by mouth 4 (four) times daily. X 7 days 08/17/13   Kathrynn Speedobyn M Hess, PA-C  levothyroxine (SYNTHROID, LEVOTHROID) 100 MCG tablet Take 1 tablet (100 mcg total) by mouth daily before breakfast. For thyroid disease. 10/03/14   Doris Cheadleeepak Advani, MD  losartan-hydrochlorothiazide (HYZAAR) 100-25 MG per tablet Take 1 tablet by mouth daily. 10/03/14   Doris Cheadleeepak Advani, MD  metoprolol (LOPRESSOR) 100 MG tablet Take 1 tablet (100 mg total) by mouth 2 (two) times daily. 10/03/14   Doris Cheadleeepak Advani, MD  mupirocin nasal ointment (BACTROBAN) 2 % Apply in each nostril daily 08/17/13   Kathrynn Speedobyn M Hess, PA-C  predniSONE (DELTASONE) 20 MG tablet 2 tabs po daily x 3 days 08/17/13   Kathrynn Speedobyn M Hess, PA-C  sertraline (ZOLOFT) 100 MG tablet TAKE 1 TABLET BY MOUTH ONCE DAILY FOR ANXIETY AND DEPRESSION 10/03/14   Doris Cheadleeepak Advani, MD  sulfamethoxazole-trimethoprim (BACTRIM DS) 800-160 MG per tablet Take 1 tablet by mouth 2 (two) times daily. 04/08/14   Doris Cheadleeepak Advani, MD   There were no vitals taken  for this visit. Physical Exam  Constitutional: She is oriented to person, place, and time. She appears well-developed and well-nourished. No distress.  HENT:  Head: Normocephalic and atraumatic.  Eyes: Conjunctivae are normal. No scleral icterus.  Neck: Normal range of motion.  Cardiovascular: Normal rate, regular rhythm and normal heart sounds.  Exam reveals no gallop and no friction rub.   No murmur heard. Pulmonary/Chest: Effort normal and breath sounds normal. No respiratory distress.  Abdominal: Soft. Bowel sounds are normal. She exhibits no distension and no mass. There is no tenderness. There is no guarding.  Musculoskeletal:       Right ankle: She exhibits decreased range of motion. She exhibits  no swelling, no ecchymosis, no deformity and normal pulse. Tenderness. Lateral malleolus and medial malleolus tenderness found. Achilles tendon normal.       Feet:  Neurological: She is alert and oriented to person, place, and time.  Skin: Skin is warm and dry. She is not diaphoretic.  Nursing note and vitals reviewed.   ED Course  Procedures (including critical care time) Labs Review Labs Reviewed - No data to display  Imaging Review No results found.   EKG Interpretation None      MDM   Final diagnoses:  Ankle pain, right    Patient with ankle pain.  Treated as gout. X-ray shows degenerative changes. Patient does have patches of rash that appear similar to psoriasis. A question if the patient may have underlying psoriatic arthritis. The patient will be [facility]    Arthor CaptainAbigail Breea Loncar, PA-C 01/06/15 1613  Tomasita CrumbleAdeleke Oni, MD 01/07/15 1145

## 2015-01-05 ENCOUNTER — Ambulatory Visit: Payer: No Typology Code available for payment source | Attending: Internal Medicine | Admitting: Internal Medicine

## 2015-01-05 ENCOUNTER — Encounter: Payer: Self-pay | Admitting: Internal Medicine

## 2015-01-05 VITALS — BP 130/80 | HR 76 | Temp 98.0°F | Resp 16 | Wt 194.0 lb

## 2015-01-05 DIAGNOSIS — F1010 Alcohol abuse, uncomplicated: Secondary | ICD-10-CM | POA: Insufficient documentation

## 2015-01-05 DIAGNOSIS — M109 Gout, unspecified: Secondary | ICD-10-CM | POA: Insufficient documentation

## 2015-01-05 DIAGNOSIS — F17200 Nicotine dependence, unspecified, uncomplicated: Secondary | ICD-10-CM | POA: Insufficient documentation

## 2015-01-05 DIAGNOSIS — E039 Hypothyroidism, unspecified: Secondary | ICD-10-CM

## 2015-01-05 DIAGNOSIS — F419 Anxiety disorder, unspecified: Secondary | ICD-10-CM | POA: Insufficient documentation

## 2015-01-05 DIAGNOSIS — R7301 Impaired fasting glucose: Secondary | ICD-10-CM | POA: Insufficient documentation

## 2015-01-05 DIAGNOSIS — I10 Essential (primary) hypertension: Secondary | ICD-10-CM | POA: Insufficient documentation

## 2015-01-05 DIAGNOSIS — F329 Major depressive disorder, single episode, unspecified: Secondary | ICD-10-CM | POA: Insufficient documentation

## 2015-01-05 DIAGNOSIS — Z720 Tobacco use: Secondary | ICD-10-CM

## 2015-01-05 DIAGNOSIS — Z1211 Encounter for screening for malignant neoplasm of colon: Secondary | ICD-10-CM

## 2015-01-05 DIAGNOSIS — R21 Rash and other nonspecific skin eruption: Secondary | ICD-10-CM | POA: Insufficient documentation

## 2015-01-05 DIAGNOSIS — R0602 Shortness of breath: Secondary | ICD-10-CM | POA: Insufficient documentation

## 2015-01-05 DIAGNOSIS — F32A Depression, unspecified: Secondary | ICD-10-CM

## 2015-01-05 DIAGNOSIS — Z915 Personal history of self-harm: Secondary | ICD-10-CM | POA: Insufficient documentation

## 2015-01-05 DIAGNOSIS — R799 Abnormal finding of blood chemistry, unspecified: Secondary | ICD-10-CM

## 2015-01-05 DIAGNOSIS — J45909 Unspecified asthma, uncomplicated: Secondary | ICD-10-CM | POA: Insufficient documentation

## 2015-01-05 LAB — COMPLETE METABOLIC PANEL WITH GFR
ALK PHOS: 50 U/L (ref 39–117)
ALT: 16 U/L (ref 0–35)
AST: 18 U/L (ref 0–37)
Albumin: 3.9 g/dL (ref 3.5–5.2)
BILIRUBIN TOTAL: 0.4 mg/dL (ref 0.2–1.2)
BUN: 31 mg/dL — ABNORMAL HIGH (ref 6–23)
CALCIUM: 9.3 mg/dL (ref 8.4–10.5)
CO2: 29 mEq/L (ref 19–32)
Chloride: 105 mEq/L (ref 96–112)
Creat: 1.01 mg/dL (ref 0.50–1.10)
GFR, Est African American: 70 mL/min
GFR, Est Non African American: 61 mL/min
GLUCOSE: 88 mg/dL (ref 70–99)
Potassium: 4.6 mEq/L (ref 3.5–5.3)
SODIUM: 143 meq/L (ref 135–145)
Total Protein: 7 g/dL (ref 6.0–8.3)

## 2015-01-05 LAB — TSH: TSH: 1.972 u[IU]/mL (ref 0.350–4.500)

## 2015-01-05 LAB — POCT GLYCOSYLATED HEMOGLOBIN (HGB A1C): Hemoglobin A1C: 5

## 2015-01-05 MED ORDER — ALBUTEROL SULFATE HFA 108 (90 BASE) MCG/ACT IN AERS: 2.0000 | INHALATION_SPRAY | Freq: Four times a day (QID) | RESPIRATORY_TRACT | Status: DC | PRN

## 2015-01-05 MED ORDER — HYDROCORTISONE 2.5 % EX CREA: TOPICAL_CREAM | Freq: Two times a day (BID) | CUTANEOUS | Status: DC

## 2015-01-05 NOTE — Patient Instructions (Addendum)
DASH Eating Plan DASH stands for "Dietary Approaches to Stop Hypertension." The DASH eating plan is a healthy eating plan that has been shown to reduce high blood pressure (hypertension). Additional health benefits may include reducing the risk of type 2 diabetes mellitus, heart disease, and stroke. The DASH eating plan may also help with weight loss. WHAT DO I NEED TO KNOW ABOUT THE DASH EATING PLAN? For the DASH eating plan, you will follow these general guidelines:  Choose foods with a percent daily value for sodium of less than 5% (as listed on the food label).  Use salt-free seasonings or herbs instead of table salt or sea salt.  Check with your health care provider or pharmacist before using salt substitutes.  Eat lower-sodium products, often labeled as "lower sodium" or "no salt added."  Eat fresh foods.  Eat more vegetables, fruits, and low-fat dairy products.  Choose whole grains. Look for the word "whole" as the first word in the ingredient list.  Choose fish and skinless chicken or turkey more often than red meat. Limit fish, poultry, and meat to 6 oz (170 g) each day.  Limit sweets, desserts, sugars, and sugary drinks.  Choose heart-healthy fats.  Limit cheese to 1 oz (28 g) per day.  Eat more home-cooked food and less restaurant, buffet, and fast food.  Limit fried foods.  Cook foods using methods other than frying.  Limit canned vegetables. If you do use them, rinse them well to decrease the sodium.  When eating at a restaurant, ask that your food be prepared with less salt, or no salt if possible. WHAT FOODS CAN I EAT? Seek help from a dietitian for individual calorie needs. Grains Whole grain or whole wheat bread. Brown rice. Whole grain or whole wheat pasta. Quinoa, bulgur, and whole grain cereals. Low-sodium cereals. Corn or whole wheat flour tortillas. Whole grain cornbread. Whole grain crackers. Low-sodium crackers. Vegetables Fresh or frozen vegetables  (raw, steamed, roasted, or grilled). Low-sodium or reduced-sodium tomato and vegetable juices. Low-sodium or reduced-sodium tomato sauce and paste. Low-sodium or reduced-sodium canned vegetables.  Fruits All fresh, canned (in natural juice), or frozen fruits. Meat and Other Protein Products Ground beef (85% or leaner), grass-fed beef, or beef trimmed of fat. Skinless chicken or turkey. Ground chicken or turkey. Pork trimmed of fat. All fish and seafood. Eggs. Dried beans, peas, or lentils. Unsalted nuts and seeds. Unsalted canned beans. Dairy Low-fat dairy products, such as skim or 1% milk, 2% or reduced-fat cheeses, low-fat ricotta or cottage cheese, or plain low-fat yogurt. Low-sodium or reduced-sodium cheeses. Fats and Oils Tub margarines without trans fats. Light or reduced-fat mayonnaise and salad dressings (reduced sodium). Avocado. Safflower, olive, or canola oils. Natural peanut or almond butter. Other Unsalted popcorn and pretzels. The items listed above may not be a complete list of recommended foods or beverages. Contact your dietitian for more options. WHAT FOODS ARE NOT RECOMMENDED? Grains White bread. White pasta. White rice. Refined cornbread. Bagels and croissants. Crackers that contain trans fat. Vegetables Creamed or fried vegetables. Vegetables in a cheese sauce. Regular canned vegetables. Regular canned tomato sauce and paste. Regular tomato and vegetable juices. Fruits Dried fruits. Canned fruit in light or heavy syrup. Fruit juice. Meat and Other Protein Products Fatty cuts of meat. Ribs, chicken wings, bacon, sausage, bologna, salami, chitterlings, fatback, hot dogs, bratwurst, and packaged luncheon meats. Salted nuts and seeds. Canned beans with salt. Dairy Whole or 2% milk, cream, half-and-half, and cream cheese. Whole-fat or sweetened yogurt. Full-fat   cheeses or blue cheese. Nondairy creamers and whipped toppings. Processed cheese, cheese spreads, or cheese  curds. Condiments Onion and garlic salt, seasoned salt, table salt, and sea salt. Canned and packaged gravies. Worcestershire sauce. Tartar sauce. Barbecue sauce. Teriyaki sauce. Soy sauce, including reduced sodium. Steak sauce. Fish sauce. Oyster sauce. Cocktail sauce. Horseradish. Ketchup and mustard. Meat flavorings and tenderizers. Bouillon cubes. Hot sauce. Tabasco sauce. Marinades. Taco seasonings. Relishes. Fats and Oils Butter, stick margarine, lard, shortening, ghee, and bacon fat. Coconut, palm kernel, or palm oils. Regular salad dressings. Other Pickles and olives. Salted popcorn and pretzels. The items listed above may not be a complete list of foods and beverages to avoid. Contact your dietitian for more information. WHERE CAN I FIND MORE INFORMATION? National Heart, Lung, [employer] Institute: www.nhlbi.nih.gov/health/health-topics/topics/dash/ Document Released: 08/01/2011 Document Revised: 12/27/2013 Document Reviewed: 06/16/2013 [employer] Patient Information 2015 [employer]. This information is not intended to replace advice given to you by your health care provider. Make sure you discuss any questions you have with your health care provider. Fat and Cholesterol Control Diet Fat and cholesterol levels in your blood and organs are influenced by your diet. High levels of fat and cholesterol may lead to diseases of the heart, small and large blood vessels, gallbladder, liver, and pancreas. CONTROLLING FAT AND CHOLESTEROL WITH DIET Although exercise and lifestyle factors are important, your diet is key. That is because certain foods are known to raise cholesterol and others to lower it. The goal is to balance foods for their effect on cholesterol and more importantly, to replace saturated and trans fat with other types of fat, such as monounsaturated fat, polyunsaturated fat, and omega-3 fatty acids. On average, a person should consume no more than 15 to 17 g of saturated fat daily.  Saturated and trans fats are considered "bad" fats, and they will raise LDL cholesterol. Saturated fats are primarily found in animal products such as meats, butter, and cream. However, that does not mean you need to give up all your favorite foods. Today, there are good tasting, low-fat, low-cholesterol substitutes for most of the things you like to eat. Choose low-fat or nonfat alternatives. Choose round or loin cuts of red meat. These types of cuts are lowest in fat and cholesterol. Chicken (without the skin), fish, veal, and ground turkey breast are great choices. Eliminate fatty meats, such as hot dogs and salami. Even shellfish have little or no saturated fat. Have a 3 oz (85 g) portion when you eat lean meat, poultry, or fish. Trans fats are also called "partially hydrogenated oils." They are oils that have been scientifically manipulated so that they are solid at room temperature resulting in a longer shelf life and improved taste and texture of foods in which they are added. Trans fats are found in stick margarine, some tub margarines, cookies, crackers, and baked goods.  When baking and cooking, oils are a great substitute for butter. The monounsaturated oils are especially beneficial since it is believed they lower LDL and raise HDL. The oils you should avoid entirely are saturated tropical oils, such as coconut and palm.  Remember to eat a lot from food groups that are naturally free of saturated and trans fat, including fish, fruit, vegetables, beans, grains (barley, rice, couscous, bulgur wheat), and pasta (without cream sauces).  IDENTIFYING FOODS THAT LOWER FAT AND CHOLESTEROL  Soluble fiber may lower your cholesterol. This type of fiber is found in fruits such as apples, vegetables such as broccoli, potatoes, and carrots,   legumes such as beans, peas, and lentils, and grains such as barley. Foods fortified with plant sterols (phytosterol) may also lower cholesterol. You should eat at least 2 g  per day of these foods for a cholesterol lowering effect.  Read package labels to identify low-saturated fats, trans fat free, and low-fat foods at the supermarket. Select cheeses that have only 2 to 3 g saturated fat per ounce. Use a heart-healthy tub margarine that is free of trans fats or partially hydrogenated oil. When buying baked goods (cookies, crackers), avoid partially hydrogenated oils. Breads and muffins should be made from whole grains (whole-wheat or whole oat flour, instead of "flour" or "enriched flour"). Buy non-creamy canned soups with reduced salt and no added fats.  FOOD PREPARATION TECHNIQUES  Never deep-fry. If you must fry, either stir-fry, which uses very little fat, or use non-stick cooking sprays. When possible, broil, bake, or roast meats, and steam vegetables. Instead of putting butter or margarine on vegetables, use lemon and herbs, applesauce, and cinnamon (for squash and sweet potatoes). Use nonfat yogurt, salsa, and low-fat dressings for salads.  LOW-SATURATED FAT / LOW-FAT FOOD SUBSTITUTES Meats / Saturated Fat (g)  Avoid: Steak, marbled (3 oz/85 g) / 11 g  Choose: Steak, lean (3 oz/85 g) / 4 g  Avoid: Hamburger (3 oz/85 g) / 7 g  Choose: Hamburger, lean (3 oz/85 g) / 5 g  Avoid: Ham (3 oz/85 g) / 6 g  Choose: Ham, lean cut (3 oz/85 g) / 2.4 g  Avoid: Chicken, with skin, dark meat (3 oz/85 g) / 4 g  Choose: Chicken, skin removed, dark meat (3 oz/85 g) / 2 g  Avoid: Chicken, with skin, light meat (3 oz/85 g) / 2.5 g  Choose: Chicken, skin removed, light meat (3 oz/85 g) / 1 g Dairy / Saturated Fat (g)  Avoid: Whole milk (1 cup) / 5 g  Choose: Low-fat milk, 2% (1 cup) / 3 g  Choose: Low-fat milk, 1% (1 cup) / 1.5 g  Choose: Skim milk (1 cup) / 0.3 g  Avoid: Hard cheese (1 oz/28 g) / 6 g  Choose: Skim milk cheese (1 oz/28 g) / 2 to 3 g  Avoid: Cottage cheese, 4% fat (1 cup) / 6.5 g  Choose: Low-fat cottage cheese, 1% fat (1 cup) / 1.5  g  Avoid: Ice cream (1 cup) / 9 g  Choose: Sherbet (1 cup) / 2.5 g  Choose: Nonfat frozen yogurt (1 cup) / 0.3 g  Choose: Frozen fruit bar / trace  Avoid: Whipped cream (1 tbs) / 3.5 g  Choose: Nondairy whipped topping (1 tbs) / 1 g Condiments / Saturated Fat (g)  Avoid: Mayonnaise (1 tbs) / 2 g  Choose: Low-fat mayonnaise (1 tbs) / 1 g  Avoid: Butter (1 tbs) / 7 g  Choose: Extra light margarine (1 tbs) / 1 g  Avoid: Coconut oil (1 tbs) / 11.8 g  Choose: Olive oil (1 tbs) / 1.8 g  Choose: Corn oil (1 tbs) / 1.7 g  Choose: Safflower oil (1 tbs) / 1.2 g  Choose: Sunflower oil (1 tbs) / 1.4 g  Choose: Soybean oil (1 tbs) / 2.4 g  Choose: Canola oil (1 tbs) / 1 g Document Released: 08/12/2005 Document Revised: 12/07/2012 Document Reviewed: 11/10/2013 ExitCare Patient Information 2015 [employer]. This information is not intended to replace advice given to you by your health care provider. Make sure you discuss any questions you have with your health care   provider. Smoking Cessation Quitting smoking is important to your health and has many advantages. However, it is not always easy to quit since nicotine is a very addictive drug. Oftentimes, people try 3 times or more before being able to quit. This document explains the best ways for you to prepare to quit smoking. Quitting takes hard work and a lot of effort, but you can do it. ADVANTAGES OF QUITTING SMOKING  You will live longer, feel better, and live better.  Your body will feel the impact of quitting smoking almost immediately.  Within 20 minutes, blood pressure decreases. Your pulse returns to its normal level.  After 8 hours, carbon monoxide levels in the blood return to normal. Your oxygen level increases.  After 24 hours, the chance of having a heart attack starts to decrease. Your breath, hair, and body stop smelling like smoke.  After 48 hours, damaged nerve endings begin to recover. Your sense of taste  and smell improve.  After 72 hours, the body is virtually free of nicotine. Your bronchial tubes relax and breathing becomes easier.  After 2 to 12 weeks, lungs can hold more air. Exercise becomes easier and circulation improves.  The risk of having a heart attack, stroke, cancer, or lung disease is greatly reduced.  After 1 year, the risk of coronary heart disease is cut in half.  After 5 years, the risk of stroke falls to the same as a nonsmoker.  After 10 years, the risk of lung cancer is cut in half and the risk of other cancers decreases significantly.  After 15 years, the risk of coronary heart disease drops, usually to the level of a nonsmoker.  If you are pregnant, quitting smoking will improve your chances of having a healthy baby.  The people you live with, especially any children, will be healthier.  You will have extra money to spend on things other than cigarettes. QUESTIONS TO THINK ABOUT BEFORE ATTEMPTING TO QUIT You may want to talk about your answers with your health care provider.  Why do you want to quit?  If you tried to quit in the past, what helped and what did not?  What will be the most difficult situations for you after you quit? How will you plan to handle them?  Who can help you through the tough times? Your family? Friends? A health care provider?  What pleasures do you get from smoking? What ways can you still get pleasure if you quit? Here are some questions to ask your health care provider:  How can you help me to be successful at quitting?  What medicine do you think would be best for me and how should I take it?  What should I do if I need more help?  What is smoking withdrawal like? How can I get information on withdrawal? GET READY  Set a quit date.  Change your environment by getting rid of all cigarettes, ashtrays, matches, and lighters in your home, car, or work. Do not let people smoke in your home.  Review your past attempts to  quit. Think about what worked and what did not. GET SUPPORT AND ENCOURAGEMENT You have a better chance of being successful if you have help. You can get support in many ways.  Tell your family, friends, and coworkers that you are going to quit and need their support. Ask them not to smoke around you.  Get individual, group, or telephone counseling and support. Programs are available at [facility] and health  centers. Call your local health department for information about programs in your area.  Spiritual beliefs and practices may help some smokers quit.  Download a "quit meter" on your computer to keep track of quit statistics, such as how long you have gone without smoking, cigarettes not smoked, and money saved.  Get a self-help book about quitting smoking and staying off tobacco. LEARN NEW SKILLS AND BEHAVIORS  Distract yourself from urges to smoke. Talk to someone, go for a walk, or occupy your time with a task.  Change your normal routine. Take a different route to work. Drink tea instead of coffee. Eat breakfast in a different place.  Reduce your stress. Take a hot bath, exercise, or read a book.  Plan something enjoyable to do every day. Reward yourself for not smoking.  Explore interactive web-based programs that specialize in helping you quit. GET MEDICINE AND USE IT CORRECTLY Medicines can help you stop smoking and decrease the urge to smoke. Combining medicine with the above behavioral methods and support can greatly increase your chances of successfully quitting smoking.  Nicotine replacement therapy helps deliver nicotine to your body without the negative effects and risks of smoking. Nicotine replacement therapy includes nicotine gum, lozenges, inhalers, nasal sprays, and skin patches. Some may be available over-the-counter and others require a prescription.  Antidepressant medicine helps people abstain from smoking, but how this works is unknown. This medicine is  available by prescription.  Nicotinic receptor partial agonist medicine simulates the effect of nicotine in your brain. This medicine is available by prescription. Ask your health care provider for advice about which medicines to use and how to use them based on your health history. Your health care provider will tell you what side effects to look out for if you choose to be on a medicine or therapy. Carefully read the information on the package. Do not use any other product containing nicotine while using a nicotine replacement product.  RELAPSE OR DIFFICULT SITUATIONS Most relapses occur within the first 3 months after quitting. Do not be discouraged if you start smoking again. Remember, most people try several times before finally quitting. You may have symptoms of withdrawal because your body is used to nicotine. You may crave cigarettes, be irritable, feel very hungry, cough often, get headaches, or have difficulty concentrating. The withdrawal symptoms are only temporary. They are strongest when you first quit, but they will go away within 10-14 days. To reduce the chances of relapse, try to:  Avoid drinking alcohol. Drinking lowers your chances of successfully quitting.  Reduce the amount of caffeine you consume. Once you quit smoking, the amount of caffeine in your body increases and can give you symptoms, such as a rapid heartbeat, sweating, and anxiety.  Avoid smokers because they can make you want to smoke.  Do not let weight gain distract you. Many smokers will gain weight when they quit, usually less than 10 pounds. Eat a healthy diet and stay active. You can always lose the weight gained after you quit.  Find ways to improve your mood other than smoking. FOR MORE INFORMATION  www.smokefree.gov  Document Released: 08/06/2001 Document Revised: 12/27/2013 Document Reviewed: 11/21/2011 [facility] Patient Information 2015 [place], [place]. This information is not intended to replace advice  given to you by your health care provider. Make sure you discuss any questions you have with your health care provider.

## 2015-01-05 NOTE — Progress Notes (Signed)
Subjective:     Patient ID: Anita Russell, female   DOB: Jan 26, 1956, 59 y.o.   MRN: [id number]  HPI  Anita Russell is a 59 yo female with history of essential HTN, impaired fasting glucose, hypothyroidism, depression, and anxiety here today for a follow up visit and recent discharge from ED for gout flare. Patient presented to ED 01/03/15 for acute onset of sharp R ankle pain medially along the calcaneotibial joint. Denies injury or trauma, but pain felt like an ankle sprain and was unable to walk on it because of the severity. Notes ankle was not red, warm, or swollen like her typical gout flares, but pain felt very similar to previous flares. Ankle XR in ED was normal. Notes she was given "2 shots of something" in her ankle in the ED. Was discharged from ED on indomethacin, which has helped relieve her pain. Currently does not have any pain.   Patient also concerned about rash on neck and L shoulder that has been persistent for last 3-4 years. Describes rash as dry patches of redness that are crusty and sensitive to touch, but not very itchy. Reports using a topical cream from [facility] that seemed to have helped rash improve. Denies wearing any jewelry in area that could have contributed to contact dermatitis. Denies trying any new lotions, creams, soaps, etc.  Patient reports taking all of her medications everyday. Eats low salt, low sugar diet. Does not exercise much. Drinks bottle of wine 4-5 nights during the week and has cut down to now smoking half pack a day. Realizes smoking and alcohol use are harmful for her health and can worsen her anxiety and notes she is working on trying to further reduce intake. Denies headaches, changes in vision, dizziness or lightheadedness, peripheral edema.   Notes her thyroid function checked in 02/16 was normal. Denies any unintentional weight loss or weight gain, excessive fatigue, cold or heat intolerance.    Patient says she takes sertraline everyday, but  isn't sure how helpful it has been for her anxiety. Notes she still feels anxious and depressed. Notes she does not at all get along with partner she lives with, but does feel safe in the relationship. Reports having difficulty sleeping, and in the past has been hospitalized for suicidal ideation and attempt, but notes she loves her daughter very much and no longer has any intent to harm herself.    Active Ambulatory Problems    Diagnosis Date Noted  . Alcohol abuse 06/14/2012  . Gout 06/14/2012  . Hypertension 06/14/2012  . Hypothyroid 06/14/2012  . Anxiety 06/17/2012  . Arthritis 10/29/2013  . Depression 10/29/2013  . Periodic health assessment, general screening, adult 10/29/2013  . Tinnitus 10/29/2013  . Smoking 04/08/2014  . IFG (impaired fasting glucose) 04/08/2014   Resolved Ambulatory Problems    Diagnosis Date Noted  . Depression with suicidal ideation 06/14/2012   Past Medical History  Diagnosis Date  . Thyroid disease   . Mental disorder   . Asthma   . Hypothyroidism     Current outpatient prescriptions:  .  albuterol (PROVENTIL HFA;VENTOLIN HFA) 108 (90 BASE) MCG/ACT inhaler, Inhale 2 puffs into the lungs every 6 (six) hours as needed. For shortness of breath, Disp: , Rfl:  .  busPIRone (BUSPAR) 10 MG tablet, Take 1 tablet (10 mg total) by mouth 2 (two) times daily., Disp: 60 tablet, Rfl: 4 .  clindamycin (CLEOCIN) 300 MG capsule, Take 1 capsule (300 mg total) by mouth 4 (four) times daily.  X 7 days, Disp: 28 capsule, Rfl: 0 .  hydrocortisone 2.5 % cream, Apply topically 2 (two) times daily., Disp: 30 g, Rfl: 2 .  indomethacin (INDOCIN) 25 MG capsule, Take 1 capsule (25 mg total) by mouth 2 (two) times daily with a meal., Disp: 60 capsule, Rfl: 0 .  levothyroxine (SYNTHROID, LEVOTHROID) 100 MCG tablet, Take 1 tablet (100 mcg total) by mouth daily before breakfast. For thyroid disease., Disp: 30 tablet, Rfl: 4 .  losartan-hydrochlorothiazide (HYZAAR) 100-25 MG per  tablet, Take 1 tablet by mouth daily., Disp: 90 tablet, Rfl: 3 .  metoprolol (LOPRESSOR) 100 MG tablet, Take 1 tablet (100 mg total) by mouth 2 (two) times daily., Disp: 60 tablet, Rfl: 3 .  mupirocin nasal ointment (BACTROBAN) 2 %, Apply in each nostril daily, Disp: 10 g, Rfl: 0 .  predniSONE (DELTASONE) 20 MG tablet, 2 tabs po daily x 3 days, Disp: 6 tablet, Rfl: 0 .  sertraline (ZOLOFT) 100 MG tablet, TAKE 1 TABLET BY MOUTH ONCE DAILY FOR ANXIETY AND DEPRESSION, Disp: 30 tablet, Rfl: 4 .  sulfamethoxazole-trimethoprim (BACTRIM DS) 800-160 MG per tablet, Take 1 tablet by mouth 2 (two) times daily., Disp: 20 tablet, Rfl: 0   Review of Systems  Constitutional: Negative for activity change, fatigue and unexpected weight change.  Eyes: Negative for visual disturbance.  Respiratory: Negative for cough, chest tightness and shortness of breath.   Cardiovascular: Negative for chest pain, palpitations and leg swelling.  Gastrointestinal: Negative for nausea, vomiting, abdominal pain, diarrhea, constipation and blood in stool.  Endocrine: Negative for cold intolerance and heat intolerance.  Neurological: Negative for numbness and headaches.  Psychiatric/Behavioral: Positive for sleep disturbance and agitation. Negative for suicidal ideas and self-injury. The patient is nervous/anxious.     Objective: Filed Vitals:   01/05/15 0941  BP: 130/80  Pulse: 76  Temp: 98 F (36.7 C)  Resp: 16      Physical Exam  Constitutional:  Well-appearing, pleasant 59 yo female alert, oriented, and in no acute distress.  HENT:  Head: Normocephalic and atraumatic.  Eyes: Conjunctivae and EOM are normal. Pupils are equal, round, and reactive to light.  Neck: Normal range of motion. Neck supple. No thyromegaly present.  Cardiovascular: Normal rate, regular rhythm, normal heart sounds and intact distal pulses.   Pulmonary/Chest: Effort normal and breath sounds normal.  Musculoskeletal: Normal range of motion.  She exhibits no edema or tenderness.  Lymphadenopathy:    She has no cervical adenopathy.  Skin:  Erythematous macular rash on both sides of neck and on L shoulder.    Assessment & Plan:   Anita Russell is a 59 yo female with history of essential HTN, impaired fasting glucose, hypothyroidism, depression, and anxiety here today for a follow up visit and recent discharge from ED for gout flare.   Essential Hypertension Patient's took BP medication this morning, and BP in clinic was 130/80 and appears well-controlled. Reports taking all of her medications everyday. No need to adjust medications today.  Counseled on importance of low salt intake, DASH diet, exercise, smoking & alcohol cessation to maintain BP in normal range. Will order CMP with GFR to check electrolyte levels.  Orders: -     COMPLETE METABOLIC PANEL WITH GFR  Rash Does not seem to be contact dermatitis as patient denies use any jewelry or any new topical agents. Given patient his family history of RA and personal history of arthritis, rheumatoid factor was checked 03/15 and was normal. Will prescribe topical hydrocortisone for  rash. Told patient to call back for dermatology referral if rash does not resolve or worsens.  Orders: -     hydrocortisone 2.5 % cream; Apply topically 2 (two) times daily.  History of Impaired Fasting Glucose  Given patient had history of IFG, checked hemoglobin A1C today, which was normal at 5.0.   Hypothyroidism TSH checked in 02/16 was normal. Will recheck TSH level to monitor thyroid function.   Depression  Patient is doing better with depression and anxiety symptoms. No suicidal ideation or intent. Will continue current dose of sertraline.   Mammogram Patient's last mammogram was in 04/15 and was normal. She has received note in mail for mammogram and notes she will schedule an appointment with them for annual mammogram.   Colonoscopy Patient is overdue for colonoscopy. Will refer to GI   Orders: -     Ambulatory referral to Gastroenterology  Smoking & Alcohol Abuse Counseled patient extensively on importance of reducing and eventually stopping smoking and alcohol intake. Explained use can worsen symptoms of anxiety. Patient agreed and notes she will work on this.  Other orders -     albuterol (PROVENTIL HFA;VENTOLIN HFA) 108 (90 BASE) MCG/ACT inhaler; Inhale 2 puffs into the lungs every 6 (six) hours as needed. For shortness of breath   Patient was seen with medical student Agree with above assessment and plan.  Doris Cheadleeepak Advani, MD   Return in about 3 months (around 04/07/2015) for hypertension, hypothyroid.

## 2015-01-05 NOTE — Progress Notes (Signed)
Patient here for follow up Patient was recently in the ED for what she feels was a gout flair up to her ankle Patient also concerned with some dry patches of skin around her neck Patient has some trouble hearing out of her right ear and would like a referral to ENT

## 2015-01-12 ENCOUNTER — Telehealth: Payer: Self-pay

## 2015-01-12 NOTE — Telephone Encounter (Signed)
Patient is aware of her lab results 

## 2015-01-12 NOTE — Telephone Encounter (Signed)
-----   Message from Doris Cheadleeepak Advani, MD sent at 01/06/2015  9:23 AM EDT ----- Call and let the patient know that her TSH level is in normal range, continue with current dose of levothyroxine.

## 2015-01-16 ENCOUNTER — Other Ambulatory Visit: Payer: Self-pay | Admitting: Internal Medicine

## 2015-02-08 ENCOUNTER — Encounter: Payer: Self-pay | Admitting: Gastroenterology

## 2015-02-20 ENCOUNTER — Other Ambulatory Visit: Payer: Self-pay | Admitting: Internal Medicine

## 2015-02-22 ENCOUNTER — Telehealth: Payer: Self-pay

## 2015-02-22 ENCOUNTER — Telehealth: Payer: Self-pay | Admitting: Internal Medicine

## 2015-02-22 DIAGNOSIS — I10 Essential (primary) hypertension: Secondary | ICD-10-CM

## 2015-02-22 MED ORDER — METOPROLOL TARTRATE 100 MG PO TABS: 100.0000 mg | ORAL_TABLET | Freq: Two times a day (BID) | ORAL | Status: DC

## 2015-02-22 NOTE — Telephone Encounter (Signed)
Patient called requesting medication refill for metoprolol (LOPRESSOR) 100 MG tablet Please f/u with patient

## 2015-02-22 NOTE — Telephone Encounter (Signed)
Returned patient phone call Patient is aware her prescription is ready to be picked up

## 2015-03-23 ENCOUNTER — Other Ambulatory Visit: Payer: Self-pay | Admitting: Internal Medicine

## 2015-03-24 ENCOUNTER — Other Ambulatory Visit: Payer: Self-pay

## 2015-03-24 ENCOUNTER — Other Ambulatory Visit: Payer: Self-pay | Admitting: Internal Medicine

## 2015-03-24 DIAGNOSIS — E039 Hypothyroidism, unspecified: Secondary | ICD-10-CM

## 2015-03-24 DIAGNOSIS — F32A Depression, unspecified: Secondary | ICD-10-CM

## 2015-03-24 DIAGNOSIS — F329 Major depressive disorder, single episode, unspecified: Secondary | ICD-10-CM

## 2015-03-24 MED ORDER — LEVOTHYROXINE SODIUM 100 MCG PO TABS
100.0000 ug | ORAL_TABLET | Freq: Every day | ORAL | Status: DC
Start: 2015-03-24 — End: 2015-08-23

## 2015-03-24 MED ORDER — SERTRALINE HCL 100 MG PO TABS: ORAL_TABLET | ORAL | Status: DC

## 2015-03-24 NOTE — Progress Notes (Unsigned)
Patient came into office requesting a refill on her zoloft and  Thyroid medication [facility]

## 2015-06-21 ENCOUNTER — Other Ambulatory Visit: Payer: Self-pay | Admitting: Internal Medicine

## 2015-07-13 ENCOUNTER — Ambulatory Visit: Payer: No Typology Code available for payment source | Attending: Family Medicine | Admitting: Family Medicine

## 2015-07-13 ENCOUNTER — Encounter: Payer: Self-pay | Admitting: Family Medicine

## 2015-07-13 ENCOUNTER — Encounter (HOSPITAL_BASED_OUTPATIENT_CLINIC_OR_DEPARTMENT_OTHER): Payer: No Typology Code available for payment source | Admitting: Clinical

## 2015-07-13 VITALS — BP 114/76 | HR 81 | Temp 98.2°F | Resp 16 | Ht 69.5 in | Wt 180.2 lb

## 2015-07-13 DIAGNOSIS — R1011 Right upper quadrant pain: Secondary | ICD-10-CM

## 2015-07-13 DIAGNOSIS — R634 Abnormal weight loss: Secondary | ICD-10-CM | POA: Insufficient documentation

## 2015-07-13 DIAGNOSIS — F418 Other specified anxiety disorders: Secondary | ICD-10-CM

## 2015-07-13 DIAGNOSIS — F32A Depression, unspecified: Secondary | ICD-10-CM

## 2015-07-13 DIAGNOSIS — F329 Major depressive disorder, single episode, unspecified: Secondary | ICD-10-CM | POA: Insufficient documentation

## 2015-07-13 DIAGNOSIS — F17210 Nicotine dependence, cigarettes, uncomplicated: Secondary | ICD-10-CM | POA: Insufficient documentation

## 2015-07-13 DIAGNOSIS — F419 Anxiety disorder, unspecified: Principal | ICD-10-CM

## 2015-07-13 DIAGNOSIS — R197 Diarrhea, unspecified: Secondary | ICD-10-CM | POA: Insufficient documentation

## 2015-07-13 DIAGNOSIS — G8929 Other chronic pain: Secondary | ICD-10-CM | POA: Insufficient documentation

## 2015-07-13 DIAGNOSIS — R1010 Upper abdominal pain, unspecified: Secondary | ICD-10-CM

## 2015-07-13 DIAGNOSIS — I10 Essential (primary) hypertension: Secondary | ICD-10-CM

## 2015-07-13 DIAGNOSIS — R61 Generalized hyperhidrosis: Secondary | ICD-10-CM | POA: Insufficient documentation

## 2015-07-13 MED ORDER — LOSARTAN POTASSIUM-HCTZ 100-25 MG PO TABS: 1.0000 | ORAL_TABLET | Freq: Every day | ORAL | Status: DC

## 2015-07-13 MED ORDER — METOPROLOL TARTRATE 50 MG PO TABS: 50.0000 mg | ORAL_TABLET | Freq: Two times a day (BID) | ORAL | Status: DC

## 2015-07-13 NOTE — Patient Instructions (Signed)
It was a pleasure to see you today.   We are checking labs and will order a CT abdomen to begin the evaluation.   I am submitting a referral for a GI specialist to evaluate and possibly perform a colonoscopy as part of the workup.   I would like you to follow up in this office in the coming 2-4 weeks.

## 2015-07-13 NOTE — Progress Notes (Signed)
ASSESSMENT: Pt currently experiencing symptoms of anxiety and depression. [facility]; would benefit from psychoeducation and supportive counseling regarding coping with symptoms of anxiety and depression.  Stage of Change: contemplative  PLAN: 1. F/U with behavioral health consultant in next PCP visit 2. Psychiatric Medications: Zoloft. 3. Behavioral recommendation(s):   -Daily relaxation exercises, as practiced in office visit -Consider reading educational materials regarding coping with symptoms of anxiety and depression -Consider practicing meditation again SUBJECTIVE: Pt. referred by Dr Mauricio PoBreen for symptoms of anxiety and depression:  Pt. reports the following symptoms/concerns: Pt states that she has battled depression and anxiety for decades, but that life circumstances have made them increase lately. She used to meditate for 45 minutes at a time, and would walk for 30 minutes, but has often found no motivation to get up and do anything; she is thinking about meditation again.  Duration of problem: At least one month Severity: severe/moderate  OBJECTIVE: Orientation & Cognition: Oriented x3. Thought processes normal and appropriate to situation. Mood: Appropriate. Affect: appropriate Appearance: appropriate Risk of harm to self or others: no known risk of harm to self or others Substance use: none Assessments administered: PHQ9: 20/ GAD7: 10  Diagnosis: Anxiety and depression CPT Code: F41.8 -------------------------------------------- Other(s) present in the room: none  Time spent with patient in exam room: 20 minutes

## 2015-07-13 NOTE — Assessment & Plan Note (Signed)
Patient with decreased blood pressure in light of 15 lb weight loss unintentional> To reduce her dose of metoprolol to 50mg  bid, continue losartan/HCTZ for now and reassess Cmet.

## 2015-07-13 NOTE — Progress Notes (Signed)
Patient here for depression and diarrhea. Patient reports she has been fighting major depression and requests a look at her medications. Patient has diarrhea that has been going on for a while but the last 6 months it has gotten severe to the point she has been having accidents. Patient has taken her medications today.

## 2015-07-13 NOTE — Assessment & Plan Note (Signed)
Depression has been waxing and waning, foreclosed on house 3 years ago, lives without family other than her daughter, shares her current living situation with an unpleasant man who is not abusive. She denies drug use or regular alcohol use.  Denies SI, mostly out of love of her daughter. Continue Zoloft 100mg  daily which she feels is helpful.  Continue to monitor.

## 2015-07-13 NOTE — Progress Notes (Signed)
   Subjective:    Patient ID: Anita Russell, female    DOB: 05/05/1956, 59 y.o.   MRN: [id number]  HPI Patient here for multiple issues:   1. Diarrhea over the past six months, "feels like my insides are exploding", up to 10 times daily. No blood. Not preceded by travel or camping. No one else around her with diarrhea.  Endorses anorexia but no vomiting.   2. Depression, somewhat waxing and waning. Not actively with SI. Believes the Zoloft helps her somewhat. Feels tired a lot of the time.   Surgical Hx; s/p appy; s/p partial hysterectomy for benign indications.  Believes she may have had a colonoscopy about 10 years ago.   Social Hx; 1/2 to 1ppd cigarettes. Occasional wine (had 2 glasses at lunch today, but does not drink regularly).  No other substances or drugs other than her prescriptions.   ROS: Endorses weight loss unexplained; does have night sweats that soak sheets. Denies fevers. No vomiting but does have anorexia. No cough or shortness of breath. No hoarseness.    Review of Systems     Objective:   Physical Exam Generally well appearing, good historian. No acute distress HEENT Neck supple, no cervical adenopathy. Moist mucus membranes. No conjunctival pallor.  COR Regular S1S2, no extra sounds PULM Clear bilaterally, no rales or wheezes.  ABD audible bowel sounds. No masses or organomegaly. Does have notable RUQ and L sided abdominal tenderness with palpation. No rebound or guarding. EXTS no lower extremity edema.       Assessment & Plan:

## 2015-07-14 ENCOUNTER — Telehealth: Payer: Self-pay

## 2015-07-14 LAB — COMPREHENSIVE METABOLIC PANEL
ALK PHOS: 99 U/L (ref 33–130)
ALT: 34 U/L — ABNORMAL HIGH (ref 6–29)
AST: 41 U/L — AB (ref 10–35)
Albumin: 4 g/dL (ref 3.6–5.1)
BUN: 24 mg/dL (ref 7–25)
CO2: 26 mmol/L (ref 20–31)
Calcium: 8.6 mg/dL (ref 8.6–10.4)
Chloride: 98 mmol/L (ref 98–110)
Creat: 1.27 mg/dL — ABNORMAL HIGH (ref 0.50–1.05)
GLUCOSE: 105 mg/dL — AB (ref 65–99)
POTASSIUM: 3.4 mmol/L — AB (ref 3.5–5.3)
Sodium: 139 mmol/L (ref 135–146)
TOTAL PROTEIN: 7 g/dL (ref 6.1–8.1)
Total Bilirubin: 0.5 mg/dL (ref 0.2–1.2)

## 2015-07-14 LAB — CBC
HCT: 40.6 % (ref 36.0–46.0)
Hemoglobin: 13.9 g/dL (ref 12.0–15.0)
MCH: 33.7 pg (ref 26.0–34.0)
MCHC: 34.2 g/dL (ref 30.0–36.0)
MCV: 98.3 fL (ref 78.0–100.0)
MPV: 9.9 fL (ref 8.6–12.4)
Platelets: 213 10*3/uL (ref 150–400)
RBC: 4.13 MIL/uL (ref 3.87–5.11)
RDW: 13.2 % (ref 11.5–15.5)
WBC: 5.7 10*3/uL (ref 4.0–10.5)

## 2015-07-14 NOTE — Telephone Encounter (Signed)
Nurse called patient, reached voicemail. Left message for patient to call [facility], at [phone]. Nurse called patient to make patient aware of [facility] July [facility], [facility] at 2:30pm, 1st floor, radiology. Patient will need to pick [facility] contrast [facility] 1st floor, Radiology to drink prior to procedure. Patient can have nothing to eat or drink 4 hours prior to procedure.

## 2015-07-18 ENCOUNTER — Encounter (HOSPITAL_COMMUNITY): Payer: Self-pay

## 2015-07-18 ENCOUNTER — Telehealth: Payer: Self-pay

## 2015-07-18 ENCOUNTER — Ambulatory Visit (HOSPITAL_COMMUNITY)
Admission: RE | Admit: 2015-07-18 | Discharge: 2015-07-18 | Disposition: A | Discharge status: Home / Self Care | Payer: No Typology Code available for payment source | Source: Ambulatory Visit | Attending: Family Medicine | Admitting: Family Medicine

## 2015-07-18 DIAGNOSIS — G8929 Other chronic pain: Secondary | ICD-10-CM | POA: Insufficient documentation

## 2015-07-18 DIAGNOSIS — R197 Diarrhea, unspecified: Secondary | ICD-10-CM | POA: Insufficient documentation

## 2015-07-18 DIAGNOSIS — K760 Fatty (change of) liver, not elsewhere classified: Secondary | ICD-10-CM | POA: Insufficient documentation

## 2015-07-18 DIAGNOSIS — K449 Diaphragmatic hernia without obstruction or gangrene: Secondary | ICD-10-CM | POA: Insufficient documentation

## 2015-07-18 DIAGNOSIS — R1011 Right upper quadrant pain: Secondary | ICD-10-CM | POA: Insufficient documentation

## 2015-07-18 DIAGNOSIS — R634 Abnormal weight loss: Secondary | ICD-10-CM | POA: Insufficient documentation

## 2015-07-18 DIAGNOSIS — K5730 Diverticulosis of large intestine without perforation or abscess without bleeding: Secondary | ICD-10-CM | POA: Insufficient documentation

## 2015-07-18 MED ORDER — IOHEXOL 300 MG/ML  SOLN
80.0000 mL | Freq: Once | INTRAMUSCULAR | Status: AC | PRN
Start: 2015-07-18 — End: 2015-07-18
  Administered 2015-07-18: 80 mL via INTRAVENOUS

## 2015-07-18 NOTE — Telephone Encounter (Signed)
-----   Message from Barbaraann BarthelJames O Breen, MD sent at 07/14/2015  8:23 AM EST ----- Please let patient know her CBC is within normal limits.  Protein and albumin, which assess nutritional status, are normal.  Liver enzymes are mildly elevated; [facility].  Thanks,  JB

## 2015-07-18 NOTE — Telephone Encounter (Signed)
Nurse called patient, reached voicemail. Left message for patient to call Joelly Bolanos with [facility], at [phone]. Nurse called patient to make patient aware of lab results.

## 2015-07-25 NOTE — Telephone Encounter (Signed)
Nurse called patient, patient verified date of birth. Patient aware of normal CBC, protein and albumin.  Patient aware of protein and albumin assessing nutritional status. Patient aware of mildly elevated liver enzymes. Patient to see Dr. Mauricio PoBreen, Wednesday, December 7 at 3pm for follow up appointment after CT scan, as requested by Dr. Mauricio PoBreen.

## 2015-08-01 ENCOUNTER — Telehealth: Payer: Self-pay

## 2015-08-01 NOTE — Telephone Encounter (Signed)
-----   Message from Barbaraann BarthelJames O Breen, MD sent at 07/27/2015  2:12 PM EST ----- Please let patient know that her CT scan did not find any abnormalities that explain her symptoms of abdominal tenderness.  She does have a moderate hiatal hernia that does not correlate with her symptoms.   JB

## 2015-08-01 NOTE — Telephone Encounter (Signed)
Nurse called patient, patient verified date of birth. Patient aware of no abnormalities on the CT scan to explain symptoms of abdominal tenderness. Patient aware of having moderate hiatal hernia. Nurse explained a hiatal hernia is when a portion of the stomach pushes up into the diaphragm. Patient aware of hiatal hernia is not correlating with her symptoms. Patient reports having appointment with Dr. Mauricio PoBreen tomorrow. Nurse verified appointment.  Patient voices understanding and has no further questions at this time.

## 2015-08-02 ENCOUNTER — Encounter: Payer: Self-pay | Admitting: Family Medicine

## 2015-08-02 ENCOUNTER — Ambulatory Visit: Payer: No Typology Code available for payment source | Attending: Family Medicine | Admitting: Family Medicine

## 2015-08-02 DIAGNOSIS — R197 Diarrhea, unspecified: Secondary | ICD-10-CM | POA: Insufficient documentation

## 2015-08-02 DIAGNOSIS — R634 Abnormal weight loss: Secondary | ICD-10-CM | POA: Insufficient documentation

## 2015-08-02 MED ORDER — ONDANSETRON HCL 4 MG PO TABS: 4.0000 mg | ORAL_TABLET | Freq: Three times a day (TID) | ORAL | Status: DC | PRN

## 2015-08-02 NOTE — Progress Notes (Signed)
   Subjective:    Patient ID: Anita Russell, female    DOB: 12/05/1955, 59 y.o.   MRN: [id number]  HPI    Review of Systems     Objective:   Physical Exam        Assessment & Plan:

## 2015-08-02 NOTE — Patient Instructions (Addendum)
It was a pleasure to see you today. I am ordering stool studies for infectious causes.  Also a referral for Gastroenterology to determine need for endoscopic evaluation.   The nausea medicine Zofran may be taken for severe nausea.   Follow up in our office in 4 weeks.

## 2015-08-02 NOTE — Assessment & Plan Note (Signed)
Patient with continued diarrhea, weight loss. No clear explanation on CT imaging. No recent abx or travel to suggest C diff or parasitic/infectious cause.  For stool studies and referral GI for consideration endoscopic evaluation.

## 2015-08-02 NOTE — Progress Notes (Signed)
Patient here for FU from CT SCAN.  Patient denies pain at this time. Patient complains of being nauseous and having diahreea.  Patient states she has found nothing which gives her relief.  Patient does not need any refills.

## 2015-08-03 ENCOUNTER — Other Ambulatory Visit: Payer: Self-pay

## 2015-08-07 LAB — STOOL CULTURE

## 2015-08-23 ENCOUNTER — Other Ambulatory Visit: Payer: Self-pay | Admitting: Internal Medicine

## 2015-09-05 ENCOUNTER — Ambulatory Visit: Payer: Self-pay

## 2015-09-12 ENCOUNTER — Ambulatory Visit: Payer: Self-pay

## 2015-09-19 ENCOUNTER — Ambulatory Visit: Payer: Self-pay | Attending: Family Medicine

## 2015-09-22 MED FILL — METOPROLOL TARTRATE 50 MG T: 50 | 30 days supply | Qty: 60 | Fill #1

## 2015-09-22 MED FILL — SERTRALINE HCL 100 MG TAB: 100 | 30 days supply | Qty: 30 | Fill #1

## 2015-09-25 MED FILL — LEVOTHYROXINE 100 MCG TAB: 100 | 30 days supply | Qty: 30 | Fill #1

## 2015-10-23 MED FILL — METOPROLOL TARTRATE 50 MG T: 50 | 30 days supply | Qty: 60 | Fill #2

## 2015-10-23 MED FILL — LEVOTHYROXINE 100 MCG TAB: 100 | 30 days supply | Qty: 30 | Fill #2

## 2015-10-23 MED FILL — SERTRALINE HCL 100 MG TAB: 100 | 30 days supply | Qty: 30 | Fill #2

## 2015-11-22 ENCOUNTER — Other Ambulatory Visit: Payer: Self-pay | Admitting: Internal Medicine

## 2015-11-22 MED FILL — SERTRALINE HCL 100 MG TAB: 100 | 30 days supply | Qty: 30 | Fill #0

## 2015-11-22 MED FILL — METOPROLOL TARTRATE 50 MG T: 50 | 30 days supply | Qty: 60 | Fill #3

## 2015-11-22 MED FILL — ?LEVOTHYROXINE 100 MCG TAB: 100 | 30 days supply | Qty: 30 | Fill #0

## 2015-12-22 MED FILL — ?LEVOTHYROXINE 100 MCG TAB: 100 | 30 days supply | Qty: 30 | Fill #1

## 2015-12-22 MED FILL — ?SERTRALINE HCL 100 MG TAB: 100 | 30 days supply | Qty: 30 | Fill #1

## 2015-12-22 MED FILL — ?METOPROLOL 50 MG TABLET: 50 | 30 days supply | Qty: 60 | Fill #4

## 2016-01-24 MED FILL — ?LEVOTHYROXINE 100 MCG TAB: 100 | 30 days supply | Qty: 30 | Fill #2

## 2016-01-24 MED FILL — ?SERTRALINE HCL 100 MG TAB: 100 | 30 days supply | Qty: 30 | Fill #2

## 2016-01-24 MED FILL — ?METOPROLOL 50 MG TABLET: 50 | 30 days supply | Qty: 60 | Fill #5

## 2016-02-21 MED FILL — ?LEVOTHYROXINE 100 MCG TAB: 100 | 30 days supply | Qty: 30 | Fill #3

## 2016-03-01 MED FILL — ?SERTRALINE HCL 100 MG TAB: 100 | 30 days supply | Qty: 30 | Fill #3

## 2016-03-01 MED FILL — ?METOPROLOL 50 MG TABLET: 50 | 30 days supply | Qty: 60 | Fill #6

## 2016-03-21 ENCOUNTER — Other Ambulatory Visit: Payer: Self-pay | Admitting: Internal Medicine

## 2016-03-21 MED FILL — ?LEVOTHYROXINE 100 MCG TAB: 100 | 30 days supply | Qty: 30 | Fill #0

## 2016-03-21 NOTE — Telephone Encounter (Signed)
Rx Request 

## 2016-04-08 ENCOUNTER — Other Ambulatory Visit: Payer: Self-pay | Admitting: Internal Medicine

## 2016-04-08 MED FILL — ?METOPROLOL 50 MG TABLET: 50 | 30 days supply | Qty: 60 | Fill #7

## 2016-04-08 MED FILL — ?SERTRALINE HCL 100 MG TAB: 100 | 30 days supply | Qty: 30 | Fill #0

## 2016-04-23 ENCOUNTER — Other Ambulatory Visit: Payer: Self-pay | Admitting: Internal Medicine

## 2016-04-23 MED FILL — ?LEVOTHYROXINE 100 MCG TAB: 100 | 30 days supply | Qty: 30 | Fill #0

## 2016-05-13 ENCOUNTER — Other Ambulatory Visit: Payer: Self-pay | Admitting: Internal Medicine

## 2016-05-13 MED FILL — METOPROLOL TARTRATE 50 MG T: 50 | 30 days supply | Qty: 60 | Fill #8

## 2016-05-13 MED FILL — ?SERTRALINE HCL 100 MG TAB: 100 | 30 days supply | Qty: 30 | Fill #0

## 2016-05-20 ENCOUNTER — Other Ambulatory Visit: Payer: Self-pay | Admitting: Internal Medicine

## 2016-05-21 MED FILL — ?LEVOTHYROXINE 100 MCG TAB: 100 | 30 days supply | Qty: 30 | Fill #0

## 2016-06-17 ENCOUNTER — Other Ambulatory Visit: Payer: Self-pay | Admitting: Internal Medicine

## 2016-06-17 MED FILL — ?METOPROLOL 50 MG TABLET: 50 | 30 days supply | Qty: 60 | Fill #9

## 2016-06-19 ENCOUNTER — Other Ambulatory Visit: Payer: Self-pay | Admitting: Internal Medicine

## 2016-06-21 ENCOUNTER — Other Ambulatory Visit: Payer: Self-pay | Admitting: Pharmacist

## 2016-06-21 ENCOUNTER — Other Ambulatory Visit: Payer: Self-pay | Admitting: Internal Medicine

## 2016-06-21 MED ORDER — LEVOTHYROXINE SODIUM 100 MCG PO TABS: ORAL_TABLET | ORAL | 0 refills | Status: DC

## 2016-06-21 MED FILL — ?LEVOTHYROXINE 100 MCG TAB: 100 | 30 days supply | Qty: 30 | Fill #0

## 2016-07-01 ENCOUNTER — Ambulatory Visit: Payer: Self-pay | Attending: Family Medicine | Admitting: Family Medicine

## 2016-07-01 ENCOUNTER — Encounter: Payer: Self-pay | Admitting: Family Medicine

## 2016-07-01 ENCOUNTER — Encounter: Payer: Self-pay | Admitting: Licensed Clinical Social Worker

## 2016-07-01 VITALS — BP 149/71 | HR 75 | Temp 98.2°F | Ht 69.0 in | Wt 178.4 lb

## 2016-07-01 DIAGNOSIS — I10 Essential (primary) hypertension: Secondary | ICD-10-CM

## 2016-07-01 DIAGNOSIS — R21 Rash and other nonspecific skin eruption: Secondary | ICD-10-CM

## 2016-07-01 DIAGNOSIS — Z79899 Other long term (current) drug therapy: Secondary | ICD-10-CM | POA: Insufficient documentation

## 2016-07-01 DIAGNOSIS — E039 Hypothyroidism, unspecified: Secondary | ICD-10-CM

## 2016-07-01 DIAGNOSIS — Z23 Encounter for immunization: Secondary | ICD-10-CM

## 2016-07-01 DIAGNOSIS — M109 Gout, unspecified: Secondary | ICD-10-CM | POA: Insufficient documentation

## 2016-07-01 DIAGNOSIS — F329 Major depressive disorder, single episode, unspecified: Secondary | ICD-10-CM | POA: Insufficient documentation

## 2016-07-01 DIAGNOSIS — F418 Other specified anxiety disorders: Secondary | ICD-10-CM

## 2016-07-01 DIAGNOSIS — J45909 Unspecified asthma, uncomplicated: Secondary | ICD-10-CM | POA: Insufficient documentation

## 2016-07-01 DIAGNOSIS — F32A Depression, unspecified: Secondary | ICD-10-CM

## 2016-07-01 DIAGNOSIS — F419 Anxiety disorder, unspecified: Secondary | ICD-10-CM | POA: Insufficient documentation

## 2016-07-01 MED ORDER — HYDROXYZINE HCL 25 MG PO TABS: 25.0000 mg | ORAL_TABLET | Freq: Three times a day (TID) | ORAL | 3 refills | Status: DC | PRN

## 2016-07-01 MED ORDER — METOPROLOL TARTRATE 50 MG PO TABS: 50.0000 mg | ORAL_TABLET | Freq: Two times a day (BID) | ORAL | 3 refills | Status: DC

## 2016-07-01 MED ORDER — SERTRALINE HCL 100 MG PO TABS: ORAL_TABLET | ORAL | 5 refills | Status: DC

## 2016-07-01 MED ORDER — LOSARTAN POTASSIUM-HCTZ 100-25 MG PO TABS: 1.0000 | ORAL_TABLET | Freq: Every day | ORAL | 3 refills | Status: DC

## 2016-07-01 MED ORDER — HYDROCORTISONE 2.5 % EX CREA: TOPICAL_CREAM | Freq: Two times a day (BID) | CUTANEOUS | 2 refills | Status: DC

## 2016-07-01 MED FILL — HYDROCORTISONE 2.5% CREAM: 2.5 | 20 days supply | Qty: 30 | Fill #0

## 2016-07-01 MED FILL — ?SERTRALINE HCL 100 MG TAB: 100 | 30 days supply | Qty: 30 | Fill #0

## 2016-07-01 MED FILL — hydrOXYzine HCL 25 MG TABS: 25 | 30 days supply | Qty: 90 | Fill #0

## 2016-07-01 MED FILL — LOSARTAN-HCTZ 100-25 MG TAB: 100-25 | 30 days supply | Qty: 30 | Fill #0

## 2016-07-01 NOTE — BH Specialist Note (Signed)
Session Start time: 3:30 pm   End Time: 4:00 Total Time:  30 minutes Type of Service: Behavioral Health - Individual/Family Interpreter: No.   Interpreter Name & Language: N/A # [facility]-June [facility]   SUBJECTIVE: Kristin BruinsMaryanne Grable is a 60 y.o. female  Pt. was referred by Dr. Venetia NightAmao for:  anxiety and depression. Pt. reports the following symptoms/concerns: withdrawn behavior and low patience with others Duration of problem:  Pt reported that she has been dealing with anxiety since a child. Onset of depression occurred after losing home of twenty years in 2013. Severity: moderate Previous treatment: Pt has received psychotherapy and medication management with a private agency. Not currently receiving therapy services; however, is taking prescribed medication   OBJECTIVE: Mood: Anxious & Affect: Appropriate Risk of harm to self or others: Pt denied SI/HI Assessments administered: PHQ-9; GAD-7  LIFE CONTEXT:  Family & Social: Pt has an adult [place]. Pt receives limited support from friends who will provide shelter at times when vehicle is broken School/ Work: Pt has been unemployed for a year. Receives food stamps Self-Care: Pt smokes cigarettes (1 ppd) and drinks alcohol socially Life changes: Pt is currently homeless. She bounces from two friends' houses or will sleep in vehicle What is important to pt/family (values): Family, Independence, Stability   GOALS ADDRESSED:  Decrease symptoms of depression Decrease symptoms of anxiety Obtain community resources  INTERVENTIONS: Motivational Interviewing, Strength-based and Supportive   ASSESSMENT:  Pt currently experiencing depression and anxiety triggered by homelessness. Pt reports withdrawn behavior and low patience with others. Pt has limited support. Pt may benefit from psychotherapy and medication management. LCSWA provided psychoeducation on depression and anxiety and  discussed healthy coping skills to decrease symptoms. Pt identified a healthy coping skill to utilize on a weekly basis to decrease symptoms of depression and anxiety. Pt was provided community resources on housing, behavioral health, and crisis interventions.      PLAN: 1. F/U with behavioral health clinician: Pt was encouraged to contact LCSWA if symptoms worsen or fail to improve to [facility]. 2. Behavioral Health meds: Zoloft and Vistaril 3. Behavioral recommendations: LCSWA recommends that pt apply healthy coping skills discussed. Pt is encouraged to schedule follow up appointment with LCSWA 4. Referral: [facility] Counseling/Psychotherapy, [facility], Psychoeducation and Supportive Counseling 5. From scale of 1-10, how likely are you to follow plan: 8/10   Bridgett LarssonJasmine D Marsh Heckler, MSW, LCSWA  Clinical Social Worker 07/02/16 10:19 am  Warmhandoff:   Warm Hand Off Completed.

## 2016-07-01 NOTE — Progress Notes (Signed)
Would like thyroid tested today

## 2016-07-02 NOTE — Progress Notes (Signed)
Subjective:  Patient ID: Anita Russell, female    DOB: 04/15/1956  Age: 60 y.o. MRN: [id number]  CC: Hypertension   HPI Anita Russell  Is a 60 year old female with a history of depression and anxiety, hypothyroidism, hypertension who comes into the clinic for a follow-up visit after last being seen 11 months ago.  Her blood pressure is elevated and she endorses compliance with her medications; review of her med list indicates she has been taking only metoprolol and not Losartan/hydrochlorothiazide. She has not been compliant with a low-sodium diet or exercise.  She remains on Zoloft for depression but states she does not think it is working. She is currently homeless and has a lot of social stresses which worsen her anxiety.  She has been compliant with levothyroxine and will need to have her thyroid levels checked.  Past Medical History:  Diagnosis Date  . Asthma   . Depression   . Gout   . Hypertension   . Hypothyroidism   . Mental disorder   . Thyroid disease     Past Surgical History:  Procedure Laterality Date  . ABDOMINAL HYSTERECTOMY    . APPENDECTOMY      Allergies  Allergen Reactions  . Meloxicam     Very bad rash     Outpatient Medications Prior to Visit  Medication Sig Dispense Refill  . albuterol (PROVENTIL HFA;VENTOLIN HFA) 108 (90 BASE) MCG/ACT inhaler Inhale 2 puffs into the lungs every 6 (six) hours as needed. For shortness of breath 1 Inhaler 1  . levothyroxine (SYNTHROID, LEVOTHROID) 100 MCG tablet TAKE 1 TABLET BY MOUTH DAILY BEFORE BREAKFAST FOR THYROID DISEASE. 30 tablet 0  . hydrocortisone 2.5 % cream Apply topically 2 (two) times daily. 30 g 2  . metoprolol (LOPRESSOR) 50 MG tablet Take 1 tablet (50 mg total) by mouth 2 (two) times daily. 180 tablet 3  . indomethacin (INDOCIN) 25 MG capsule Take 1 capsule (25 mg total) by mouth 2 (two) times daily with a meal. (Patient not taking: Reported on 07/01/2016) 60 capsule 0  . mupirocin nasal  ointment (BACTROBAN) 2 % Apply in each nostril daily (Patient not taking: Reported on 07/13/2015) 10 g 0  . ondansetron (ZOFRAN) 4 MG tablet Take 1 tablet (4 mg total) by mouth every 8 (eight) hours as needed for nausea or vomiting. (Patient not taking: Reported on 07/01/2016) 5 tablet 0  . clindamycin (CLEOCIN) 300 MG capsule Take 1 capsule (300 mg total) by mouth 4 (four) times daily. X 7 days (Patient not taking: Reported on 07/13/2015) 28 capsule 0  . losartan-hydrochlorothiazide (HYZAAR) 100-25 MG tablet Take 1 tablet by mouth daily. (Patient not taking: Reported on 07/01/2016) 90 tablet 3  . sertraline (ZOLOFT) 100 MG tablet TAKE 1 TABLET BY MOUTH ONCE DAILY FOR ANXIETY AND DEPRESSION (Patient not taking: Reported on 07/01/2016) 30 tablet 0   No facility-administered medications prior to visit.     ROS Review of Systems  Constitutional: Negative for activity change, appetite change and fatigue.  HENT: Negative for congestion, sinus pressure and sore throat.   Eyes: Negative for visual disturbance.  Respiratory: Negative for cough, chest tightness, shortness of breath and wheezing.   Cardiovascular: Negative for chest pain and palpitations.  Gastrointestinal: Negative for abdominal distention, abdominal pain and constipation.  Endocrine: Negative for polydipsia.  Genitourinary: Negative for dysuria and frequency.  Musculoskeletal: Negative for arthralgias and back pain.  Skin: Positive for rash.  Neurological: Negative for tremors, light-headedness and numbness.  Hematological: Does not  bruise/bleed easily.  Psychiatric/Behavioral: Negative for agitation and behavioral problems.       Positive for anxiety    Objective:  BP (!) 149/71 (BP Location: Right Arm, Patient Position: Sitting, Cuff Size: Small)   Pulse 75   Temp 98.2 F (36.8 C) (Oral)   Ht 5\' 9"  (1.753 m)   Wt 178 lb 6.4 oz (80.9 kg)   SpO2 99%   BMI 26.35 kg/m   BP/Weight 07/01/2016 08/02/2015 07/13/2015  Systolic BP  149 409118 114  Diastolic BP 71 78 76  Wt. (Lbs) 178.4 174.8 180.2  BMI 26.35 25.8 26.24  Some encounter information is confidential and restricted. Go to Review Flowsheets activity to see all data.      Physical Exam  Constitutional: She is oriented to person, place, and time. She appears well-developed and well-nourished.  Cardiovascular: Normal rate, normal heart sounds and intact distal pulses.   No murmur heard. Pulmonary/Chest: Effort normal and breath sounds normal. She has no wheezes. She has no rales. She exhibits no tenderness.  Abdominal: Soft. Bowel sounds are normal. She exhibits no distension and no mass. There is no tenderness.  Musculoskeletal: Normal range of motion.  Neurological: She is alert and oriented to person, place, and time.  Skin: Rash (Multiple scaly rash in upper and lower extremities with scabs over them.) noted.  Psychiatric: Her mood appears anxious.     Assessment & Plan:   1. Essential hypertension Uncontrolled as she has been taking only metoprolol and not losartan - COMPLETE METABOLIC PANEL WITH GFR; Future - Lipid panel; Future - losartan-hydrochlorothiazide (HYZAAR) 100-25 MG tablet; Take 1 tablet by mouth daily.  Dispense: 90 tablet; Refill: 3 - metoprolol (LOPRESSOR) 50 MG tablet; Take 1 tablet (50 mg total) by mouth 2 (two) times daily.  Dispense: 180 tablet; Refill: 3  2. Rash and nonspecific skin eruption Unknown etiology, will need to exclude psoriasis - hydrocortisone 2.5 % cream; Apply topically 2 (two) times daily.  Dispense: 30 g; Refill: 2  3. Acquired hypothyroidism - TSH; Future  4. Anxiety and depression Uncontrolled. Underlying social situations LCSW called in for therapy and to discuss community resources available to the patient. - hydrOXYzine (ATARAX/VISTARIL) 25 MG tablet; Take 1 tablet (25 mg total) by mouth 3 (three) times daily as needed.  Dispense: 90 tablet; Refill: 3 - sertraline (ZOLOFT) 100 MG tablet; TAKE 1  TABLET BY MOUTH ONCE DAILY FOR ANXIETY AND DEPRESSION  Dispense: 30 tablet; Refill: 5  5. Encounter for immunization - Flu Vaccine QUAD 36+ mos IM   Meds ordered this encounter  Medications  . losartan-hydrochlorothiazide (HYZAAR) 100-25 MG tablet    Sig: Take 1 tablet by mouth daily.    Dispense:  90 tablet    Refill:  3  . hydrOXYzine (ATARAX/VISTARIL) 25 MG tablet    Sig: Take 1 tablet (25 mg total) by mouth 3 (three) times daily as needed.    Dispense:  90 tablet    Refill:  3  . metoprolol (LOPRESSOR) 50 MG tablet    Sig: Take 1 tablet (50 mg total) by mouth 2 (two) times daily.    Dispense:  180 tablet    Refill:  3  . sertraline (ZOLOFT) 100 MG tablet    Sig: TAKE 1 TABLET BY MOUTH ONCE DAILY FOR ANXIETY AND DEPRESSION    Dispense:  30 tablet    Refill:  5  . hydrocortisone 2.5 % cream    Sig: Apply topically 2 (two) times daily.  Dispense:  30 g    Refill:  2    Follow-up: Return in about 3 months (around 10/01/2016) for follow up on hypothyroidism.   Jaclyn Shaggy MD

## 2016-07-04 ENCOUNTER — Ambulatory Visit: Payer: Self-pay | Attending: Family Medicine

## 2016-07-04 DIAGNOSIS — I10 Essential (primary) hypertension: Secondary | ICD-10-CM | POA: Insufficient documentation

## 2016-07-04 DIAGNOSIS — E039 Hypothyroidism, unspecified: Secondary | ICD-10-CM

## 2016-07-04 LAB — COMPLETE METABOLIC PANEL WITH GFR
ALT: 12 U/L (ref 6–29)
AST: 17 U/L (ref 10–35)
Albumin: 3.7 g/dL (ref 3.6–5.1)
Alkaline Phosphatase: 43 U/L (ref 33–130)
BILIRUBIN TOTAL: 0.4 mg/dL (ref 0.2–1.2)
BUN: 14 mg/dL (ref 7–25)
CO2: 29 mmol/L (ref 20–31)
Calcium: 8.9 mg/dL (ref 8.6–10.4)
Chloride: 104 mmol/L (ref 98–110)
Creat: 0.99 mg/dL (ref 0.50–0.99)
GFR, EST NON AFRICAN AMERICAN: 62 mL/min (ref >=60)
GFR, Est African American: 72 mL/min (ref >=60)
GLUCOSE: 85 mg/dL (ref 65–99)
Potassium: 4.8 mmol/L (ref 3.5–5.3)
SODIUM: 140 mmol/L (ref 135–146)
TOTAL PROTEIN: 6.3 g/dL (ref 6.1–8.1)

## 2016-07-04 LAB — LIPID PANEL
CHOL/HDL RATIO: 3.7 ratio (ref <5.0)
Cholesterol: 228 mg/dL — ABNORMAL HIGH (ref <200)
HDL: 62 mg/dL (ref >50)
LDL CALC: 105 mg/dL — AB
Triglycerides: 304 mg/dL — ABNORMAL HIGH (ref <150)
VLDL: 61 mg/dL — ABNORMAL HIGH (ref <30)

## 2016-07-04 LAB — TSH: TSH: 2.61 m[IU]/L

## 2016-07-04 NOTE — Progress Notes (Signed)
Patient here for lab visit only 

## 2016-07-05 ENCOUNTER — Other Ambulatory Visit: Payer: Self-pay | Admitting: Family Medicine

## 2016-07-05 DIAGNOSIS — E785 Hyperlipidemia, unspecified: Secondary | ICD-10-CM | POA: Insufficient documentation

## 2016-07-05 DIAGNOSIS — E7800 Pure hypercholesterolemia, unspecified: Secondary | ICD-10-CM

## 2016-07-05 MED ORDER — ATORVASTATIN CALCIUM 20 MG PO TABS: 20.0000 mg | ORAL_TABLET | Freq: Every day | ORAL | 3 refills | Status: DC

## 2016-07-10 ENCOUNTER — Telehealth: Payer: Self-pay

## 2016-07-10 NOTE — Telephone Encounter (Signed)
Writer called patient with lab results.  LVM requesting for patient to call back and discuss.

## 2016-07-10 NOTE — Telephone Encounter (Signed)
-----   Message from Jaclyn ShaggyEnobong Amao, MD sent at 07/05/2016  5:07 PM EST ----- Your kidney, liver functions are normal. Cholesterol is elevated and I have sent a prescription for Lipitor to your pharmacy.

## 2016-07-10 NOTE — Telephone Encounter (Signed)
-----   Message from [name] Amao, MD sent at 07/05/2016  5:07 PM EST ----- Your kidney, liver functions are normal. Cholesterol is elevated and I have sent a prescription for Lipitor to your pharmacy. 

## 2016-07-10 NOTE — Telephone Encounter (Signed)
Writer spoke with patient  Per Dr. Venetia NightAmao regarding lab results.  Patient stated understanding and will pick up the lipitor tomorrow.

## 2016-07-12 MED FILL — ?METOPROLOL 50 MG TABLET: 50 | 30 days supply | Qty: 60 | Fill #10

## 2016-07-16 MED FILL — ATORVASTATIN 20 MG TABLET: 20 | 30 days supply | Qty: 30 | Fill #0

## 2016-07-23 MED FILL — ?LEVOTHYROXINE 100 MCG TAB: 100 | 30 days supply | Qty: 30 | Fill #0

## 2016-08-12 ENCOUNTER — Other Ambulatory Visit: Payer: Self-pay | Admitting: Family Medicine

## 2016-08-12 ENCOUNTER — Other Ambulatory Visit: Payer: Self-pay | Admitting: Internal Medicine

## 2016-08-12 DIAGNOSIS — I10 Essential (primary) hypertension: Secondary | ICD-10-CM

## 2016-08-12 MED FILL — LOSARTAN-HCTZ 100-25 MG TAB: 100-25 | 30 days supply | Qty: 30 | Fill #1

## 2016-08-12 MED FILL — ?SERTRALINE HCL 100 MG TAB: 100 | 30 days supply | Qty: 30 | Fill #1

## 2016-08-12 MED FILL — ?ATORVASTATIN 20 MG TABLET: 20 | 30 days supply | Qty: 30 | Fill #1

## 2016-08-13 MED FILL — ?LEVOTHYROXINE 100 MCG TAB: 100 | 30 days supply | Qty: 30 | Fill #0

## 2016-08-14 ENCOUNTER — Ambulatory Visit: Payer: Self-pay | Admitting: Internal Medicine

## 2016-08-21 MED FILL — METOPROLOL TARTRATE 50 MG T: 50 | 30 days supply | Qty: 60 | Fill #0

## 2016-09-23 MED FILL — ?LEVOTHYROXINE 100 MCG TAB: 100 | 30 days supply | Qty: 30 | Fill #1

## 2016-09-23 MED FILL — LOSARTAN-HCTZ 100-25 MG TAB: 100-25 | 30 days supply | Qty: 30 | Fill #2

## 2016-09-23 MED FILL — hydrOXYzine HCL 25 MG TABS: 25 | 30 days supply | Qty: 90 | Fill #1

## 2016-09-23 MED FILL — METOPROLOL TARTRATE 50 MG T: 50 | 30 days supply | Qty: 60 | Fill #1

## 2016-09-23 MED FILL — ?SERTRALINE HCL 100 MG TAB: 100 | 30 days supply | Qty: 30 | Fill #2

## 2016-10-21 MED FILL — ?LEVOTHYROXINE 100 MCG TAB: 100 | 30 days supply | Qty: 30 | Fill #2

## 2016-10-21 MED FILL — ATORVASTATIN 20 MG TABLET: 20 | 30 days supply | Qty: 30 | Fill #2

## 2016-10-21 MED FILL — METOPROLOL TARTRATE 50 MG T: 50 | 30 days supply | Qty: 60 | Fill #2

## 2016-11-21 MED FILL — ?LEVOTHYROXINE 100 MCG TAB: 100 | 30 days supply | Qty: 30 | Fill #3

## 2016-11-21 MED FILL — METOPROLOL TARTRATE 50 MG T: 50 | 30 days supply | Qty: 60 | Fill #3

## 2016-11-21 MED FILL — SERTRALINE HCL 100 MG TAB: 100 | 30 days supply | Qty: 30 | Fill #3

## 2016-11-21 MED FILL — ATORVASTATIN 20 MG TABLET: 20 | 30 days supply | Qty: 30 | Fill #3

## 2016-12-24 ENCOUNTER — Other Ambulatory Visit: Payer: Self-pay | Admitting: Family Medicine

## 2016-12-24 MED FILL — SERTRALINE HCL 100 MG TAB: 100 | 30 days supply | Qty: 30 | Fill #4

## 2016-12-24 MED FILL — METOPROLOL TARTRATE 50 MG T: 50 | 30 days supply | Qty: 60 | Fill #4

## 2016-12-25 MED FILL — ?LEVOTHYROXINE 100 MCG TAB: 100 | 30 days supply | Qty: 30 | Fill #0

## 2016-12-30 ENCOUNTER — Ambulatory Visit (HOSPITAL_COMMUNITY)
Admission: EM | Admit: 2016-12-30 | Discharge: 2016-12-30 | Disposition: A | Discharge status: Home / Self Care | Payer: Self-pay | Attending: Internal Medicine | Admitting: Internal Medicine

## 2016-12-30 ENCOUNTER — Encounter (HOSPITAL_COMMUNITY): Payer: Self-pay | Admitting: *Deleted

## 2016-12-30 DIAGNOSIS — M109 Gout, unspecified: Secondary | ICD-10-CM

## 2016-12-30 MED ORDER — PREDNISONE 50 MG PO TABS: 50.0000 mg | ORAL_TABLET | Freq: Every day | ORAL | 0 refills | Status: DC

## 2016-12-30 MED ORDER — OXYCODONE-ACETAMINOPHEN 5-325 MG PO TABS: 1.0000 | ORAL_TABLET | ORAL | 0 refills | Status: DC | PRN

## 2016-12-30 NOTE — ED Provider Notes (Signed)
[facility]    CSN: [id number] Arrival date & time: 12/30/16  1559     History   Chief Complaint Chief Complaint  Patient presents with  . Foot Pain    HPI Anita Russell is a 61 y.o. female. Presents with pain/redness/swelling in base of left great toe x 4d, has taken some old prednisone (?10mg ) with minimal relief.  Hx gout.  No fever, no malaise.  No broken skin/rash/fissures on feet.    HPI  Past Medical History:  Diagnosis Date  . Asthma   . Depression   . Gout   . Hypertension   . Hypothyroidism   . Mental disorder   . Thyroid disease     Patient Active Problem List   Diagnosis Date Noted  . Hyperlipidemia 07/05/2016  . Loss of weight 07/13/2015  . Chronic RUQ pain 07/13/2015  . Diarrhea 07/13/2015  . Smoking 04/08/2014  . IFG (impaired fasting glucose) 04/08/2014  . Arthritis 10/29/2013  . Anxiety and depression 10/29/2013  . Periodic health assessment, general screening, adult 10/29/2013  . Tinnitus 10/29/2013  . Anxiety 06/17/2012  . Alcohol abuse 06/14/2012  . Gout 06/14/2012  . Hypertension 06/14/2012  . Hypothyroid 06/14/2012    Past Surgical History:  Procedure Laterality Date  . ABDOMINAL HYSTERECTOMY    . APPENDECTOMY       Home Medications    Prior to Admission medications   Medication Sig Start Date End Date Taking? Authorizing Provider  albuterol (PROVENTIL HFA;VENTOLIN HFA) 108 (90 BASE) MCG/ACT inhaler Inhale 2 puffs into the lungs every 6 (six) hours as needed. For shortness of breath 01/05/15   Doris Cheadle, MD  atorvastatin (LIPITOR) 20 MG tablet Take 1 tablet (20 mg total) by mouth daily. 07/05/16   Jaclyn Shaggy, MD  hydrocortisone 2.5 % cream Apply topically 2 (two) times daily. 07/01/16   Jaclyn Shaggy, MD  hydrOXYzine (ATARAX/VISTARIL) 25 MG tablet Take 1 tablet (25 mg total) by mouth 3 (three) times daily as needed. 07/01/16   Jaclyn Shaggy, MD  levothyroxine (SYNTHROID, LEVOTHROID) 100 MCG tablet TAKE 1  TABLET BY MOUTH DAILY BEFORE BREAKFAST FOR THYROID DISEASE. 12/25/16   Jaclyn Shaggy, MD  losartan-hydrochlorothiazide (HYZAAR) 100-25 MG tablet Take 1 tablet by mouth daily. 07/01/16   Jaclyn Shaggy, MD  metoprolol (LOPRESSOR) 50 MG tablet Take 1 tablet (50 mg total) by mouth 2 (two) times daily. 07/01/16   Jaclyn Shaggy, MD  oxyCODONE-acetaminophen (PERCOCET/ROXICET) 5-325 MG tablet Take 1 tablet by mouth every 4 (four) hours as needed for severe pain. 12/30/16   Eustace Moore, MD  predniSONE (DELTASONE) 50 MG tablet Take 1 tablet (50 mg total) by mouth daily. 12/30/16   Eustace Moore, MD  sertraline (ZOLOFT) 100 MG tablet TAKE 1 TABLET BY MOUTH ONCE DAILY FOR ANXIETY AND DEPRESSION 07/01/16   Jaclyn Shaggy, MD    Family History Family History  Problem Relation Age of Onset  . Arthritis Father     Social History Social History  Substance Use Topics  . Smoking status: Current Every Day Smoker    Packs/day: 1.00    Types: Cigarettes  . Smokeless tobacco: Never Used  . Alcohol use 2.4 - 4.2 oz/week    1 - 3 Glasses of wine, 3 - 4 Cans of beer per week     Comment: 3 times weekly     Allergies   Meloxicam   Review of Systems Review of Systems  All other systems reviewed and are negative.  Physical Exam Triage Vital Signs ED Triage Vitals  Enc Vitals Group     BP 12/30/16 1645 133/89     Pulse Rate 12/30/16 1645 (!) 106     Resp 12/30/16 1645 18     Temp 12/30/16 1645 98.4 F (36.9 C)     Temp Source 12/30/16 1645 Oral     SpO2 12/30/16 1645 99 %     Weight --      Height --      Pain Score 12/30/16 1643 9     Pain Loc --    Updated Vital Signs BP 133/89 (BP Location: Left Arm)   Pulse (!) 106   Temp 98.4 F (36.9 C) (Oral)   Resp 18   SpO2 99%   Physical Exam  Constitutional: She is oriented to person, place, and time. No distress.  HENT:  Head: Atraumatic.  Eyes:  Conjugate gaze observed, no eye redness/discharge  Neck: Neck supple.  Cardiovascular:  Normal rate.   Pulmonary/Chest: No respiratory distress.  Abdominal: She exhibits no distension.  Musculoskeletal: Normal range of motion.  Neurological: She is alert and oriented to person, place, and time.  Skin: Skin is warm and dry.  Tense swelling/erythema/tenderness/warmth left 1st MTP.  No fissuring between toes.  No rash on feet.  Nursing note and vitals reviewed.    UC Treatments / Results   Procedures Procedures (including critical care time) None today  Final Clinical Impressions(s) / UC Diagnoses   Final diagnoses:  Acute gout involving toe of left foot, unspecified cause   Ice 5-10 minutes several times daily may help with swelling/pain.  Elevate foot above heart level.  [facility] [address]  Prescription for small number of percocet printed. Recheck if redness/pain/swelling not decreasing within 48 hours, or for new fever >100.5.    New Prescriptions Discharge Medication List as of 12/30/2016  5:35 PM    START taking these medications   Details  oxyCODONE-acetaminophen (PERCOCET/ROXICET) 5-325 MG tablet Take 1 tablet by mouth every 4 (four) hours as needed for severe pain., Starting Mon 12/30/2016, Print    predniSONE (DELTASONE) 50 MG tablet Take 1 tablet (50 mg total) by mouth daily., Starting Mon 12/30/2016, Normal         Eustace MooreMurray, Arrabella Westerman W, MD 12/31/16 2213

## 2016-12-30 NOTE — Discharge Instructions (Addendum)
Ice 5-10 minutes several times daily may help with swelling/pain.  Elevate foot above heart level.  [facility] [address].  Prescription for small number of percocet printed. Recheck if redness/pain/swelling not decreasing within 48 hours, or for new fever >100.5.

## 2016-12-30 NOTE — ED Triage Notes (Signed)
History  Of    Gout  Pain  l   Foot  X  5  Days        Denies   Any  Injury    Pain  Worse  On   Weight     Bearing

## 2017-01-04 ENCOUNTER — Encounter (HOSPITAL_COMMUNITY): Payer: Self-pay | Admitting: Emergency Medicine

## 2017-01-04 ENCOUNTER — Ambulatory Visit (HOSPITAL_COMMUNITY)
Admission: EM | Admit: 2017-01-04 | Discharge: 2017-01-04 | Disposition: A | Discharge status: Home / Self Care | Payer: Self-pay | Attending: Internal Medicine | Admitting: Internal Medicine

## 2017-01-04 DIAGNOSIS — M109 Gout, unspecified: Secondary | ICD-10-CM

## 2017-01-04 MED ORDER — COLCHICINE 0.6 MG PO TABS: 0.6000 mg | ORAL_TABLET | Freq: Every day | ORAL | 0 refills | Status: DC

## 2017-01-04 MED ORDER — PREDNISONE 20 MG PO TABS: 20.0000 mg | ORAL_TABLET | Freq: Every day | ORAL | 0 refills | Status: AC

## 2017-01-04 MED ORDER — HYDROCODONE-ACETAMINOPHEN 5-325 MG PO TABS: 2.0000 | ORAL_TABLET | ORAL | 0 refills | Status: AC | PRN

## 2017-01-04 NOTE — ED Triage Notes (Signed)
Pt here for left foot pain onset 1 week +++   Pt w/hx of gout... Denies inj/trauma  Sx today include: swelling, redness, warm, pain  Seen here on 5/7 for similar sx.   Steady gait... Cane upon arrival.... A&O x4... NAD

## 2017-01-04 NOTE — ED Provider Notes (Signed)
CSN: [id number]     Arrival date & time 01/04/17  1203 History   First MD Initiated Contact with Patient 01/04/17 1230     Chief Complaint  Patient presents with  . Gout   (Consider location/radiation/quality/duration/timing/severity/associated sxs/prior Treatment) Patient is a well-appearing 61 year old female, with history of gout, presents today with concern for gout attack of her left great toe. Patient was seem on 05/07 for same and was given prednisone. Patient states the prednisone helped a bit and took the edge off but never completely resolved the problem. Patient denies taking any medication for gout prophylaxis. She reports that she frequently gets gout attack.          Past Medical History:  Diagnosis Date  . Asthma   . Depression   . Gout   . Hypertension   . Hypothyroidism   . Mental disorder   . Thyroid disease    Past Surgical History:  Procedure Laterality Date  . ABDOMINAL HYSTERECTOMY    . APPENDECTOMY     Family History  Problem Relation Age of Onset  . Arthritis Father    Social History  Substance Use Topics  . Smoking status: Current Every Day Smoker    Packs/day: 1.00    Types: Cigarettes  . Smokeless tobacco: Never Used  . Alcohol use 2.4 - 4.2 oz/week    1 - 3 Glasses of wine, 3 - 4 Cans of beer per week     Comment: 3 times weekly   OB History    No data available     Review of Systems  Constitutional:       Stated in the history of present illness    Allergies  Meloxicam  Home Medications   Prior to Admission medications   Medication Sig Start Date End Date Taking? Authorizing Provider  albuterol (PROVENTIL HFA;VENTOLIN HFA) 108 (90 BASE) MCG/ACT inhaler Inhale 2 puffs into the lungs every 6 (six) hours as needed. For shortness of breath 01/05/15  Yes Advani, Deepak, MD  atorvastatin (LIPITOR) 20 MG tablet Take 1 tablet (20 mg total) by mouth daily. 07/05/16  Yes Jaclyn Shaggy, MD  hydrocortisone 2.5 % cream Apply topically 2  (two) times daily. 07/01/16  Yes Amao, Odette Horns, MD  levothyroxine (SYNTHROID, LEVOTHROID) 100 MCG tablet TAKE 1 TABLET BY MOUTH DAILY BEFORE BREAKFAST FOR THYROID DISEASE. 12/25/16  Yes Jaclyn Shaggy, MD  metoprolol (LOPRESSOR) 50 MG tablet Take 1 tablet (50 mg total) by mouth 2 (two) times daily. 07/01/16  Yes Jaclyn Shaggy, MD  sertraline (ZOLOFT) 100 MG tablet TAKE 1 TABLET BY MOUTH ONCE DAILY FOR ANXIETY AND DEPRESSION 07/01/16  Yes Jaclyn Shaggy, MD  colchicine 0.6 MG tablet Take 1 tablet (0.6 mg total) by mouth daily. 1.2 mg now, repeat 0.6 mg one hour later. 01/04/17 01/05/17  Lucia Estelle, NP  HYDROcodone-acetaminophen (NORCO/VICODIN) 5-325 MG tablet Take 2 tablets by mouth every 4 (four) hours as needed. 01/04/17 01/07/17  Lucia Estelle, NP  hydrOXYzine (ATARAX/VISTARIL) 25 MG tablet Take 1 tablet (25 mg total) by mouth 3 (three) times daily as needed. 07/01/16   Jaclyn Shaggy, MD  losartan-hydrochlorothiazide (HYZAAR) 100-25 MG tablet Take 1 tablet by mouth daily. 07/01/16   Jaclyn Shaggy, MD  oxyCODONE-acetaminophen (PERCOCET/ROXICET) 5-325 MG tablet Take 1 tablet by mouth every 4 (four) hours as needed for severe pain. 12/30/16   Eustace Moore, MD  predniSONE (DELTASONE) 20 MG tablet Take 1 tablet (20 mg total) by mouth daily with breakfast. 60 mg on day 1-2,  40 mg on day 3-4, 20 mg on day 5-6 01/04/17 01/10/17  Lucia EstelleZheng, Mandela Bello, NP   Meds Ordered and Administered this Visit  Medications - No data to display  BP (!) 142/96 (BP Location: Right Arm) Comment: notified rn  Pulse 90   Temp 98.9 F (37.2 C) (Oral)   Resp 14   SpO2 95%  No data found.   Physical Exam  Constitutional: She is oriented to person, place, and time. She appears well-developed and well-nourished.  Cardiovascular: Normal rate, regular rhythm and normal heart sounds.   Pulmonary/Chest: Effort normal and breath sounds normal. She has no wheezes.  Musculoskeletal:  Has swelling and tenderness over the base of left great toe. ROM  limited due to pain. See picture below.   Neurological: She is alert and oriented to person, place, and time.  Nursing note and vitals reviewed.     Urgent Care Course     Procedures (including critical care time)  Labs Review Labs Reviewed - No data to display  Imaging Review No results found.  MDM   1. Acute gout involving toe of left foot, unspecified cause    Clinical presentation classic for gout flare up. Start colchicine 1.2 mg now, repeat 0.6 mg one hour later. Also start 6 day prednisone taper. Prescription for hydrocodone-acetaminophen given to for pain relief to take as needed. Patient informed to follow with PCP 1-2 weeks for evaluation and possibly consider gout prophylaxis.    Lucia EstelleZheng, Antario Yasuda, NP 01/04/17 1246

## 2017-01-04 NOTE — Discharge Instructions (Signed)
1) Start taking colchicine, take 2 tablets (1.2 mg total), then repeat 1 tablet (0.6 mg) 1 hour later.  2) Start Prednisone 6-day taper 3) Take pain medicine only as needed 4) You must follow up with PCP next 1-2 weeks for evaluation.

## 2017-01-28 ENCOUNTER — Other Ambulatory Visit: Payer: Self-pay | Admitting: Family Medicine

## 2017-01-28 DIAGNOSIS — F329 Major depressive disorder, single episode, unspecified: Secondary | ICD-10-CM

## 2017-01-28 DIAGNOSIS — F419 Anxiety disorder, unspecified: Principal | ICD-10-CM

## 2017-01-28 DIAGNOSIS — E7800 Pure hypercholesterolemia, unspecified: Secondary | ICD-10-CM

## 2017-01-28 DIAGNOSIS — F32A Depression, unspecified: Secondary | ICD-10-CM

## 2017-01-28 MED FILL — ?ATORVASTATIN 20 MG TABLET: 20 | 30 days supply | Qty: 30 | Fill #0

## 2017-01-28 MED FILL — SERTRALINE HCL 100 MG TAB: 100 | 30 days supply | Qty: 30 | Fill #5

## 2017-01-28 MED FILL — METOPROLOL TARTRATE 50 MG T: 50 | 30 days supply | Qty: 60 | Fill #5

## 2017-01-28 MED FILL — ?HYDROXYZINE HCL 25 MG TAB: 25 MG | 30 days supply | Qty: 90 | Fill #2

## 2017-01-29 ENCOUNTER — Other Ambulatory Visit: Payer: Self-pay | Admitting: Family Medicine

## 2017-01-29 NOTE — Telephone Encounter (Signed)
PT came to the office requesting a refill for levothyroxine (SYNTHROID, LEVOTHROID) 100 MCG tablet  She has an appt for next week with the pcp but she has been out of her since  Sunday please if you auth call her and let her know even is not

## 2017-01-30 MED FILL — ?LEVOTHYROXINE 100 MCG TAB: 100 | 30 days supply | Qty: 30 | Fill #0

## 2017-02-04 ENCOUNTER — Ambulatory Visit: Payer: Self-pay | Attending: Family Medicine | Admitting: Family Medicine

## 2017-02-04 ENCOUNTER — Encounter: Payer: Self-pay | Admitting: Family Medicine

## 2017-02-04 VITALS — BP 115/74 | HR 83 | Temp 98.3°F | Resp 18 | Ht 69.5 in | Wt 181.0 lb

## 2017-02-04 DIAGNOSIS — I10 Essential (primary) hypertension: Secondary | ICD-10-CM

## 2017-02-04 DIAGNOSIS — E7800 Pure hypercholesterolemia, unspecified: Secondary | ICD-10-CM

## 2017-02-04 DIAGNOSIS — F329 Major depressive disorder, single episode, unspecified: Secondary | ICD-10-CM | POA: Insufficient documentation

## 2017-02-04 DIAGNOSIS — E039 Hypothyroidism, unspecified: Secondary | ICD-10-CM

## 2017-02-04 DIAGNOSIS — F32A Depression, unspecified: Secondary | ICD-10-CM

## 2017-02-04 DIAGNOSIS — J45909 Unspecified asthma, uncomplicated: Secondary | ICD-10-CM | POA: Insufficient documentation

## 2017-02-04 DIAGNOSIS — M1000 Idiopathic gout, unspecified site: Secondary | ICD-10-CM

## 2017-02-04 DIAGNOSIS — Z79899 Other long term (current) drug therapy: Secondary | ICD-10-CM | POA: Insufficient documentation

## 2017-02-04 DIAGNOSIS — J4520 Mild intermittent asthma, uncomplicated: Secondary | ICD-10-CM

## 2017-02-04 DIAGNOSIS — E785 Hyperlipidemia, unspecified: Secondary | ICD-10-CM | POA: Insufficient documentation

## 2017-02-04 DIAGNOSIS — F419 Anxiety disorder, unspecified: Secondary | ICD-10-CM | POA: Insufficient documentation

## 2017-02-04 MED ORDER — LOSARTAN POTASSIUM-HCTZ 100-25 MG PO TABS: 1.0000 | ORAL_TABLET | Freq: Every day | ORAL | 6 refills | Status: DC

## 2017-02-04 MED ORDER — ATORVASTATIN CALCIUM 20 MG PO TABS: 20.0000 mg | ORAL_TABLET | Freq: Every day | ORAL | 6 refills | Status: DC

## 2017-02-04 MED ORDER — LEVOTHYROXINE SODIUM 100 MCG PO TABS: ORAL_TABLET | ORAL | 6 refills | Status: DC

## 2017-02-04 MED ORDER — ALBUTEROL SULFATE HFA 108 (90 BASE) MCG/ACT IN AERS: 2.0000 | INHALATION_SPRAY | Freq: Four times a day (QID) | RESPIRATORY_TRACT | 1 refills | Status: DC | PRN

## 2017-02-04 MED ORDER — COLCHICINE 0.6 MG PO TABS: 0.6000 mg | ORAL_TABLET | Freq: Every day | ORAL | 1 refills | Status: DC

## 2017-02-04 MED ORDER — HYDROXYZINE HCL 25 MG PO TABS: 25.0000 mg | ORAL_TABLET | Freq: Three times a day (TID) | ORAL | 3 refills | Status: DC | PRN

## 2017-02-04 MED ORDER — METOPROLOL TARTRATE 50 MG PO TABS: 50.0000 mg | ORAL_TABLET | Freq: Two times a day (BID) | ORAL | 6 refills | Status: DC

## 2017-02-04 MED ORDER — SERTRALINE HCL 100 MG PO TABS: ORAL_TABLET | ORAL | 6 refills | Status: DC

## 2017-02-04 MED FILL — ?HYDROXYZINE HCL 25 MG TAB: 25 MG | 30 days supply | Qty: 90 | Fill #0

## 2017-02-04 MED FILL — LOSARTAN-HCTZ 100-25 MG TAB: 100-25 | 30 days supply | Qty: 30 | Fill #0

## 2017-02-04 MED FILL — PROVENTIL HFA 108 (90 BASE): 108 (90 BAS | 25 days supply | Qty: 7 | Fill #0

## 2017-02-04 MED FILL — COLCHICINE 0.6 MG TABLET: 0.6 | 30 days supply | Qty: 30 | Fill #0

## 2017-02-04 NOTE — Patient Instructions (Signed)

## 2017-02-04 NOTE — Progress Notes (Signed)
Subjective:  Patient ID: [name], female    DOB: Sep 29, 1955  Age: 61 y.o. MRN: [id number]  CC: Hypothyroidism   HPI Cimone Fahey Is a 61 year old female with a history of depression and anxiety, hypothyroidism, hypertension, Hyperlipidemia who comes into the clinic for a follow-up visit.  She has been compliant with her antihypertensive and denies any side effects from medications. She was seen last month for gout flares at urgent care center - had 2 flares in the last month and was placed on colchicine with relief of symptoms.  Prior to that, flares been frequent.  Her depression and anxiety are controlled. Denies suicidal ideation or intent. She uses albuterol MDI as needed for asthma flares which are usually triggered by the pollen   Past Medical History:  Diagnosis Date  . Asthma   . Depression   . Gout   . Hypertension   . Hypothyroidism   . Mental disorder   . Thyroid disease     Past Surgical History:  Procedure Laterality Date  . ABDOMINAL HYSTERECTOMY    . APPENDECTOMY       Outpatient Medications Prior to Visit  Medication Sig Dispense Refill  . hydrocortisone 2.5 % cream Apply topically 2 (two) times daily. 30 g 2  . albuterol (PROVENTIL HFA;VENTOLIN HFA) 108 (90 BASE) MCG/ACT inhaler Inhale 2 puffs into the lungs every 6 (six) hours as needed. For shortness of breath 1 Inhaler 1  . atorvastatin (LIPITOR) 20 MG tablet TAKE 1 TABLET BY MOUTH DAILY. 30 tablet 3  . colchicine 0.6 MG tablet Take 1 tablet (0.6 mg total) by mouth daily. 1.2 mg now, repeat 0.6 mg one hour later. 3 tablet 0  . hydrOXYzine (ATARAX/VISTARIL) 25 MG tablet Take 1 tablet (25 mg total) by mouth 3 (three) times daily as needed. 90 tablet 3  . levothyroxine (SYNTHROID, LEVOTHROID) 100 MCG tablet TAKE 1 TABLET BY MOUTH DAILY BEFORE BREAKFAST FOR THYROID DISEASE. 30 tablet 0  . losartan-hydrochlorothiazide (HYZAAR) 100-25 MG tablet Take 1 tablet by mouth daily. 90 tablet 3  .  metoprolol (LOPRESSOR) 50 MG tablet Take 1 tablet (50 mg total) by mouth 2 (two) times daily. 180 tablet 3  . sertraline (ZOLOFT) 100 MG tablet TAKE 1 TABLET BY MOUTH ONCE DAILY FOR ANXIETY AND DEPRESSION 30 tablet 5  . oxyCODONE-acetaminophen (PERCOCET/ROXICET) 5-325 MG tablet Take 1 tablet by mouth every 4 (four) hours as needed for severe pain. (Patient not taking: Reported on 02/04/2017) 6 tablet 0   No facility-administered medications prior to visit.     ROS Review of Systems  Constitutional: Negative for activity change, appetite change and fatigue.  HENT: Negative for congestion, sinus pressure and sore throat.   Eyes: Negative for visual disturbance.  Respiratory: Negative for cough, chest tightness, shortness of breath and wheezing.   Cardiovascular: Negative for chest pain and palpitations.  Gastrointestinal: Negative for abdominal distention, abdominal pain and constipation.  Endocrine: Negative for polydipsia.  Genitourinary: Negative for dysuria and frequency.  Musculoskeletal: Negative for arthralgias and back pain.  Skin: Positive for rash.  Neurological: Positive for tremors. Negative for light-headedness and numbness.  Hematological: Bruises/bleeds easily.  Psychiatric/Behavioral: Negative for agitation and behavioral problems.    Objective:  BP 115/74 (BP Location: Left Arm, Patient Position: Sitting, Cuff Size: Normal)   Pulse 83   Temp 98.3 F (36.8 C) (Oral)   Resp 18   Ht 5' 9.5" (1.765 m)   Wt 181 lb (82.1 kg)   SpO2 98%  BMI 26.35 kg/m   BP/Weight 02/04/2017 10/31/6576 11/30/9627  Systolic BP 528 413 244  Diastolic BP 74 96 89  Wt. (Lbs) 181 - -  BMI 26.35 - -  Some encounter information is confidential and restricted. Go to Review Flowsheets activity to see all data.      Physical Exam  Constitutional: She is oriented to person, place, and time. She appears well-developed and well-nourished.  Cardiovascular: Normal rate, normal heart sounds and  intact distal pulses.   No murmur heard. Pulmonary/Chest: Effort normal and breath sounds normal. She has no wheezes. She has no rales. She exhibits no tenderness.  Abdominal: Soft. Bowel sounds are normal. She exhibits no distension and no mass. There is no tenderness.  Musculoskeletal: Normal range of motion.  Neurological: She is alert and oriented to person, place, and time.  Tremors of the outstretched hand bilaterally  Skin:  Multiple scabs and bruises at different stages of healing on arms and legs  Psychiatric: She has a normal mood and affect.     Assessment & Plan:   1. Anxiety and depression Controlled - sertraline (ZOLOFT) 100 MG tablet; TAKE 1 TABLET BY MOUTH ONCE DAILY FOR ANXIETY AND DEPRESSION  Dispense: 30 tablet; Refill: 6 - hydrOXYzine (ATARAX/VISTARIL) 25 MG tablet; Take 1 tablet (25 mg total) by mouth 3 (three) times daily as needed.  Dispense: 90 tablet; Refill: 3  2. Essential hypertension Controlled - CMP14+EGFR; Future - losartan-hydrochlorothiazide (HYZAAR) 100-25 MG tablet; Take 1 tablet by mouth daily.  Dispense: 30 tablet; Refill: 6 - metoprolol tartrate (LOPRESSOR) 50 MG tablet; Take 1 tablet (50 mg total) by mouth 2 (two) times daily.  Dispense: 180 tablet; Refill: 6  3. Pure hypercholesterolemia Stable - Lipid panel; Future - atorvastatin (LIPITOR) 20 MG tablet; Take 1 tablet (20 mg total) by mouth daily.  Dispense: 30 tablet; Refill: 6  4. Acquired hypothyroidism Controlled - TSH; Future - levothyroxine (SYNTHROID, LEVOTHROID) 100 MCG tablet; TAKE 1 TABLET BY MOUTH DAILY BEFORE BREAKFAST FOR THYROID DISEASE.  Dispense: 30 tablet; Refill: 6  5. Idiopathic gout, unspecified chronicity, unspecified site No acute flares Consider  Allopurinol if she has frequent flares - colchicine 0.6 MG tablet; Take 1 tablet (0.6 mg total) by mouth daily. 1.2 mg now, repeat 0.6 mg one hour later.  Dispense: 30 tablet; Refill: 1  6. Mild intermittent asthma  without complication Stables - albuterol (PROVENTIL HFA;VENTOLIN HFA) 108 (90 Base) MCG/ACT inhaler; Inhale 2 puffs into the lungs every 6 (six) hours as needed. For shortness of breath  Dispense: 1 Inhaler; Refill: 1   Meds ordered this encounter  Medications  . levothyroxine (SYNTHROID, LEVOTHROID) 100 MCG tablet    Sig: TAKE 1 TABLET BY MOUTH DAILY BEFORE BREAKFAST FOR THYROID DISEASE.    Dispense:  30 tablet    Refill:  6  . sertraline (ZOLOFT) 100 MG tablet    Sig: TAKE 1 TABLET BY MOUTH ONCE DAILY FOR ANXIETY AND DEPRESSION    Dispense:  30 tablet    Refill:  6  . losartan-hydrochlorothiazide (HYZAAR) 100-25 MG tablet    Sig: Take 1 tablet by mouth daily.    Dispense:  30 tablet    Refill:  6  . metoprolol tartrate (LOPRESSOR) 50 MG tablet    Sig: Take 1 tablet (50 mg total) by mouth 2 (two) times daily.    Dispense:  180 tablet    Refill:  6  . hydrOXYzine (ATARAX/VISTARIL) 25 MG tablet    Sig: Take 1 tablet (  25 mg total) by mouth 3 (three) times daily as needed.    Dispense:  90 tablet    Refill:  3  . colchicine 0.6 MG tablet    Sig: Take 1 tablet (0.6 mg total) by mouth daily. 1.2 mg now, repeat 0.6 mg one hour later.    Dispense:  30 tablet    Refill:  1  . albuterol (PROVENTIL HFA;VENTOLIN HFA) 108 (90 Base) MCG/ACT inhaler    Sig: Inhale 2 puffs into the lungs every 6 (six) hours as needed. For shortness of breath    Dispense:  1 Inhaler    Refill:  1  . atorvastatin (LIPITOR) 20 MG tablet    Sig: Take 1 tablet (20 mg total) by mouth daily.    Dispense:  30 tablet    Refill:  6    Follow-up: Return in about 3 weeks (around 02/25/2017) for complete physical exam.   Arnoldo Morale MD

## 2017-02-04 NOTE — Progress Notes (Signed)
Patient is here for Hypothyroidism  Patient request a refill on her Inhaler, Colchicine, and Levothyroxine.  Patient has taken BP medication today. Patient has eaten today.

## 2017-02-10 ENCOUNTER — Ambulatory Visit: Payer: Self-pay | Attending: Family Medicine

## 2017-02-10 DIAGNOSIS — E7800 Pure hypercholesterolemia, unspecified: Secondary | ICD-10-CM | POA: Insufficient documentation

## 2017-02-10 DIAGNOSIS — I10 Essential (primary) hypertension: Secondary | ICD-10-CM | POA: Insufficient documentation

## 2017-02-10 DIAGNOSIS — E039 Hypothyroidism, unspecified: Secondary | ICD-10-CM | POA: Insufficient documentation

## 2017-02-10 NOTE — Progress Notes (Signed)
Patient here for lab visit only 

## 2017-02-11 ENCOUNTER — Telehealth: Payer: Self-pay

## 2017-02-11 LAB — CMP14+EGFR
ALK PHOS: 49 IU/L (ref 39–117)
ALT: 13 IU/L (ref 0–32)
AST: 19 IU/L (ref 0–40)
Albumin/Globulin Ratio: 1.7 (ref 1.2–2.2)
Albumin: 3.7 g/dL (ref 3.6–4.8)
BUN/Creatinine Ratio: 12 (ref 12–28)
BUN: 12 mg/dL (ref 8–27)
Bilirubin Total: 0.5 mg/dL (ref 0.0–1.2)
CHLORIDE: 103 mmol/L (ref 96–106)
CO2: 25 mmol/L (ref 20–29)
Calcium: 9.1 mg/dL (ref 8.7–10.3)
Creatinine, Ser: 1.01 mg/dL — ABNORMAL HIGH (ref 0.57–1.00)
GFR calc Af Amer: 69 mL/min/{1.73_m2} (ref >59)
GFR calc non Af Amer: 60 mL/min/{1.73_m2} (ref >59)
GLUCOSE: 86 mg/dL (ref 65–99)
Globulin, Total: 2.2 g/dL (ref 1.5–4.5)
Potassium: 5.3 mmol/L — ABNORMAL HIGH (ref 3.5–5.2)
SODIUM: 143 mmol/L (ref 134–144)
Total Protein: 5.9 g/dL — ABNORMAL LOW (ref 6.0–8.5)

## 2017-02-11 LAB — LIPID PANEL
CHOLESTEROL TOTAL: 192 mg/dL (ref 100–199)
Chol/HDL Ratio: 2.9 ratio (ref 0.0–4.4)
HDL: 66 mg/dL (ref >39)
LDL CALC: 63 mg/dL (ref 0–99)
Triglycerides: 316 mg/dL — ABNORMAL HIGH (ref 0–149)
VLDL CHOLESTEROL CAL: 63 mg/dL — AB (ref 5–40)

## 2017-02-11 LAB — TSH: TSH: 12.75 u[IU]/mL — ABNORMAL HIGH (ref 0.450–4.500)

## 2017-02-11 NOTE — Telephone Encounter (Signed)
CMA call regarding results  Patient di not answer CMA left a detailed message per [facility] & stated if have any questions just to call back

## 2017-02-11 NOTE — Telephone Encounter (Signed)
-----   Message from Jaclyn ShaggyEnobong Amao, MD sent at 02/11/2017  1:37 PM EDT ----- Thyroid lab is abnormal and this could be explained if she had run out of her medication. We will need to repeat this as well as her potassium which is slightly elevated at her next visit.

## 2017-03-05 MED FILL — ?METOPROLOL 50 MG TABLET: 50 | 30 days supply | Qty: 180 | Fill #0

## 2017-03-05 MED FILL — ?LEVOTHYROXINE 100 MCG TAB: 100 | 30 days supply | Qty: 30 | Fill #0

## 2017-03-05 MED FILL — SERTRALINE HCL 100 MG TAB: 100 | 30 days supply | Qty: 30 | Fill #0

## 2017-03-11 ENCOUNTER — Ambulatory Visit: Payer: Self-pay | Attending: Family Medicine | Admitting: Family Medicine

## 2017-03-11 ENCOUNTER — Encounter: Payer: Self-pay | Admitting: Family Medicine

## 2017-03-11 VITALS — BP 100/70 | HR 77 | Temp 98.4°F | Resp 19 | Ht 69.0 in | Wt 178.0 lb

## 2017-03-11 DIAGNOSIS — Z1231 Encounter for screening mammogram for malignant neoplasm of breast: Secondary | ICD-10-CM

## 2017-03-11 DIAGNOSIS — Z23 Encounter for immunization: Secondary | ICD-10-CM

## 2017-03-11 DIAGNOSIS — I10 Essential (primary) hypertension: Secondary | ICD-10-CM | POA: Insufficient documentation

## 2017-03-11 DIAGNOSIS — R21 Rash and other nonspecific skin eruption: Secondary | ICD-10-CM | POA: Insufficient documentation

## 2017-03-11 DIAGNOSIS — M109 Gout, unspecified: Secondary | ICD-10-CM | POA: Insufficient documentation

## 2017-03-11 DIAGNOSIS — F329 Major depressive disorder, single episode, unspecified: Secondary | ICD-10-CM | POA: Insufficient documentation

## 2017-03-11 DIAGNOSIS — Z90710 Acquired absence of both cervix and uterus: Secondary | ICD-10-CM | POA: Insufficient documentation

## 2017-03-11 DIAGNOSIS — Z888 Allergy status to other drugs, medicaments and biological substances status: Secondary | ICD-10-CM | POA: Insufficient documentation

## 2017-03-11 DIAGNOSIS — A630 Anogenital (venereal) warts: Secondary | ICD-10-CM | POA: Insufficient documentation

## 2017-03-11 DIAGNOSIS — Z1211 Encounter for screening for malignant neoplasm of colon: Secondary | ICD-10-CM

## 2017-03-11 DIAGNOSIS — Z124 Encounter for screening for malignant neoplasm of cervix: Secondary | ICD-10-CM | POA: Insufficient documentation

## 2017-03-11 DIAGNOSIS — Z9889 Other specified postprocedural states: Secondary | ICD-10-CM | POA: Insufficient documentation

## 2017-03-11 DIAGNOSIS — E039 Hypothyroidism, unspecified: Secondary | ICD-10-CM | POA: Insufficient documentation

## 2017-03-11 DIAGNOSIS — Z1239 Encounter for other screening for malignant neoplasm of breast: Secondary | ICD-10-CM

## 2017-03-11 DIAGNOSIS — Z0000 Encounter for general adult medical examination without abnormal findings: Secondary | ICD-10-CM

## 2017-03-11 DIAGNOSIS — J45909 Unspecified asthma, uncomplicated: Secondary | ICD-10-CM | POA: Insufficient documentation

## 2017-03-11 DIAGNOSIS — Z79899 Other long term (current) drug therapy: Secondary | ICD-10-CM | POA: Insufficient documentation

## 2017-03-11 DIAGNOSIS — R42 Dizziness and giddiness: Secondary | ICD-10-CM | POA: Insufficient documentation

## 2017-03-11 MED ORDER — HYDROCORTISONE 2.5 % EX CREA: TOPICAL_CREAM | Freq: Two times a day (BID) | CUTANEOUS | 2 refills | Status: DC

## 2017-03-11 MED ORDER — LOSARTAN POTASSIUM-HCTZ 50-12.5 MG PO TABS: 1.0000 | ORAL_TABLET | Freq: Every day | ORAL | 5 refills | Status: DC

## 2017-03-11 NOTE — Progress Notes (Signed)
Patient has eaten Patient has taken medications  Patient is dizzy light headed and feels very weak  Patient is feels that it is the new medication  losatan-HCTZ 100-25mg  and atorvastatin 20mg   Patient has felt like this for 2 weeks  Patient had new medications filled on the 6/12

## 2017-03-11 NOTE — Patient Instructions (Signed)

## 2017-03-12 NOTE — Progress Notes (Signed)
Subjective:  Patient ID: Anita Russell, female    DOB: 01/26/1956  Age: 61 y.o. MRN: [id number]  CC: Annual Exam   HPI Adriane Gabbert presents for a complete physical. Complains of dizziness especially when she rises from seated position; no headaches. Bp is on the soft side.  Past Medical History:  Diagnosis Date  . Asthma   . Depression   . Gout   . Hypertension   . Hypothyroidism   . Mental disorder   . Thyroid disease     Past Surgical History:  Procedure Laterality Date  . ABDOMINAL HYSTERECTOMY    . APPENDECTOMY      Allergies  Allergen Reactions  . Meloxicam     Very bad rash     Outpatient Medications Prior to Visit  Medication Sig Dispense Refill  . albuterol (PROVENTIL HFA;VENTOLIN HFA) 108 (90 Base) MCG/ACT inhaler Inhale 2 puffs into the lungs every 6 (six) hours as needed. For shortness of breath 1 Inhaler 1  . atorvastatin (LIPITOR) 20 MG tablet Take 1 tablet (20 mg total) by mouth daily. 30 tablet 6  . colchicine 0.6 MG tablet Take 1 tablet (0.6 mg total) by mouth daily. 1.2 mg now, repeat 0.6 mg one hour later. 30 tablet 1  . hydrOXYzine (ATARAX/VISTARIL) 25 MG tablet Take 1 tablet (25 mg total) by mouth 3 (three) times daily as needed. 90 tablet 3  . levothyroxine (SYNTHROID, LEVOTHROID) 100 MCG tablet TAKE 1 TABLET BY MOUTH DAILY BEFORE BREAKFAST FOR THYROID DISEASE. 30 tablet 6  . metoprolol tartrate (LOPRESSOR) 50 MG tablet Take 1 tablet (50 mg total) by mouth 2 (two) times daily. 180 tablet 6  . sertraline (ZOLOFT) 100 MG tablet TAKE 1 TABLET BY MOUTH ONCE DAILY FOR ANXIETY AND DEPRESSION 30 tablet 6  . hydrocortisone 2.5 % cream Apply topically 2 (two) times daily. 30 g 2  . losartan-hydrochlorothiazide (HYZAAR) 100-25 MG tablet Take 1 tablet by mouth daily. 30 tablet 6   No facility-administered medications prior to visit.     ROS Review of Systems  Constitutional: Negative for activity change, appetite change and fatigue.  HENT:  Negative for congestion, sinus pressure and sore throat.   Eyes: Negative for visual disturbance.  Respiratory: Negative for cough, chest tightness, shortness of breath and wheezing.   Cardiovascular: Negative for chest pain and palpitations.  Gastrointestinal: Negative for abdominal distention, abdominal pain and constipation.  Endocrine: Negative for polydipsia.  Genitourinary: Negative for dysuria and frequency.  Musculoskeletal: Negative for arthralgias and back pain.  Skin: Negative for rash.  Neurological: Positive for dizziness. Negative for tremors, light-headedness and numbness.  Hematological: Does not bruise/bleed easily.  Psychiatric/Behavioral: Negative for agitation and behavioral problems.    Objective:  BP 100/70 (BP Location: Left Arm, Patient Position: Sitting, Cuff Size: Normal)   Pulse 77   Temp 98.4 F (36.9 C) (Oral)   Resp 19   Ht 5\' 9"  (1.753 m)   Wt 178 lb (80.7 kg)   SpO2 97%   BMI 26.29 kg/m   BP/Weight 03/11/2017 02/04/2017 01/04/2017  Systolic BP 100 115 142  Diastolic BP 70 74 96  Wt. (Lbs) 178 181 -  BMI 26.29 26.35 -  Some encounter information is confidential and restricted. Go to Review Flowsheets activity to see all data.      Physical Exam  Constitutional: She is oriented to person, place, and time. She appears well-developed and well-nourished. No distress.  HENT:  Head: Normocephalic.  Right Ear: External ear normal.  Left Ear: External ear normal.  Nose: Nose normal.  Mouth/Throat: Oropharynx is clear and moist.  Eyes: Pupils are equal, round, and reactive to light. Conjunctivae and EOM are normal.  Neck: Normal range of motion. No JVD present.  Cardiovascular: Normal rate, regular rhythm, normal heart sounds and intact distal pulses.  Exam reveals no gallop.   No murmur heard. Pulmonary/Chest: Effort normal and breath sounds normal. No respiratory distress. She has no wheezes. She has no rales. She exhibits no tenderness. Right  breast exhibits no mass and no tenderness. Left breast exhibits no mass and no tenderness.  Abdominal: Soft. Bowel sounds are normal. She exhibits no distension and no mass. There is no tenderness.  Genitourinary:  Genitourinary Comments: External genitalia, vagina normal. Absent cervix  Musculoskeletal: Normal range of motion. She exhibits no edema or tenderness.  Neurological: She is alert and oriented to person, place, and time. She has normal reflexes.  Skin: Skin is warm and dry. She is not diaphoretic.  Psychiatric: She has a normal mood and affect.     Assessment & Plan:   1. Annual physical exam  2. Screening for breast cancer - MM Digital Screening; Future  3. Screening for cervical cancer s/p partial hysterectomy, no PAP smears indicated going forward - Cytology - PAP [place]  4. Screening for colon cancer - Ambulatory referral to Gastroenterology  5. Dizziness Likely from over medication Reduced dose of antihypertensive  6. Essential hypertension - losartan-hydrochlorothiazide (HYZAAR) 50-12.5 MG tablet; Take 1 tablet by mouth daily.  Dispense: 30 tablet; Refill: 5  7. Rash and nonspecific skin eruption Unknown etiology - hydrocortisone 2.5 % cream; Apply topically 2 (two) times daily.  Dispense: 30 g; Refill: 2   Meds ordered this encounter  Medications  . hydrocortisone 2.5 % cream    Sig: Apply topically 2 (two) times daily.    Dispense:  30 g    Refill:  2  . losartan-hydrochlorothiazide (HYZAAR) 50-12.5 MG tablet    Sig: Take 1 tablet by mouth daily.    Dispense:  30 tablet    Refill:  5    Reduced previous dose    Follow-up: Return in about 3 months (around 06/11/2017) for Follow-up of chronic medical conditions.   Jaclyn ShaggyEnobong Amao MD

## 2017-03-14 LAB — CYTOLOGY - PAP
DIAGNOSIS: NEGATIVE
HPV 16/18/45 GENOTYPING: POSITIVE — AB
HPV: DETECTED — AB

## 2017-03-21 ENCOUNTER — Other Ambulatory Visit: Payer: Self-pay | Admitting: Family Medicine

## 2017-03-21 DIAGNOSIS — R87618 Other abnormal cytological findings on specimens from cervix uteri: Secondary | ICD-10-CM

## 2017-03-21 DIAGNOSIS — R8789 Other abnormal findings in specimens from female genital organs: Secondary | ICD-10-CM

## 2017-03-24 ENCOUNTER — Encounter: Payer: Self-pay | Admitting: *Deleted

## 2017-03-26 ENCOUNTER — Other Ambulatory Visit: Payer: Self-pay | Admitting: Family Medicine

## 2017-03-26 DIAGNOSIS — Z1231 Encounter for screening mammogram for malignant neoplasm of breast: Secondary | ICD-10-CM

## 2017-03-31 ENCOUNTER — Telehealth: Payer: Self-pay | Admitting: Family Medicine

## 2017-03-31 NOTE — Telephone Encounter (Signed)
Pt called back, has more questions about the results, please f/up

## 2017-04-01 ENCOUNTER — Encounter: Payer: Self-pay | Admitting: Obstetrics & Gynecology

## 2017-04-01 DIAGNOSIS — R87810 Cervical high risk human papillomavirus (HPV) DNA test positive: Secondary | ICD-10-CM | POA: Insufficient documentation

## 2017-04-01 NOTE — Telephone Encounter (Signed)
Left message on voicemail to return call.

## 2017-04-04 ENCOUNTER — Encounter: Payer: Self-pay | Admitting: Obstetrics and Gynecology

## 2017-04-07 MED FILL — LEVOTHYROXINE 100 MCG TAB: 100 | 30 days supply | Qty: 30 | Fill #1

## 2017-04-07 MED FILL — SERTRALINE HCL 100 MG TAB: 100 | 30 days supply | Qty: 30 | Fill #1

## 2017-04-07 MED FILL — ?ATORVASTATIN 20 MG TABLET: 20 | 30 days supply | Qty: 30 | Fill #1

## 2017-05-07 MED FILL — LEVOTHYROXINE 100 MCG TAB: 100 | 30 days supply | Qty: 30 | Fill #2

## 2017-05-07 MED FILL — SERTRALINE HCL 100 MG TAB: 100 | 30 days supply | Qty: 30 | Fill #2

## 2017-05-08 ENCOUNTER — Ambulatory Visit: Payer: Self-pay | Admitting: Obstetrics and Gynecology

## 2017-05-12 ENCOUNTER — Other Ambulatory Visit: Payer: Self-pay | Admitting: Obstetrics and Gynecology

## 2017-05-12 DIAGNOSIS — Z1231 Encounter for screening mammogram for malignant neoplasm of breast: Secondary | ICD-10-CM

## 2017-05-20 ENCOUNTER — Encounter (HOSPITAL_COMMUNITY): Payer: Self-pay

## 2017-05-20 ENCOUNTER — Ambulatory Visit (HOSPITAL_COMMUNITY)
Admission: RE | Admit: 2017-05-20 | Discharge: 2017-05-20 | Disposition: A | Discharge status: Home / Self Care | Payer: Self-pay | Source: Ambulatory Visit | Attending: Obstetrics and Gynecology | Admitting: Obstetrics and Gynecology

## 2017-05-20 ENCOUNTER — Ambulatory Visit
Admission: RE | Admit: 2017-05-20 | Discharge: 2017-05-20 | Disposition: A | Discharge status: Home / Self Care | Payer: Self-pay | Source: Ambulatory Visit | Attending: Obstetrics and Gynecology | Admitting: Obstetrics and Gynecology

## 2017-05-20 VITALS — BP 124/86 | Temp 98.8°F | Ht 69.0 in | Wt 183.0 lb

## 2017-05-20 DIAGNOSIS — Z1231 Encounter for screening mammogram for malignant neoplasm of breast: Secondary | ICD-10-CM

## 2017-05-20 DIAGNOSIS — Z1239 Encounter for other screening for malignant neoplasm of breast: Secondary | ICD-10-CM

## 2017-05-20 HISTORY — DX: Anxiety disorder, unspecified: F41.9

## 2017-05-20 NOTE — Progress Notes (Signed)
No complaints today.   Pap Smear: Pap smear not completed today. Last Pap smear was 03/11/2017 at [facility] and normal with positive HPV. Per patient has no history of an abnormal Pap smear. Last Pap smear result is in EPIC.  Physical exam: Breasts Breasts symmetrical. No skin abnormalities bilateral breasts. No nipple retraction bilateral breasts. No nipple discharge bilateral breasts. No lymphadenopathy. No lumps palpated bilateral breasts. No complaints of pain or tenderness on exam. Referred patient to the [facility] for a screening mammogram. Appointment scheduled for Tuesday, May 20, 2017 at 1440.        Pelvic/Bimanual No Pap smear completed today since last Pap smear was 03/11/2017. Pap smear not indicated per BCCCP guidelines.   Smoking History: Patient is a current smoker. Discussed smoking cessation with patient. Referred patient to the [facility] Quitline and gave resources to the free smoking cessation classes offered at [facility].  Patient Navigation: Patient education provided. Access to services provided for patient through BCCCP program.   Colorectal Cancer Screening: Per patient had a colonoscopy completed 15-16 years ago. No complaints today. FIT Test given to patient to complete and return to BCCCP.

## 2017-05-20 NOTE — Patient Instructions (Addendum)
Explained breast self awareness with [employer]. Patient did not need a Pap smear today due to last Pap smear was 03/11/2017. Let patient know that her next Pap smear is due in July 2019 due to her last Pap smear was HPV positive. Referred patient to the [facility] for a screening mammogram. Appointment scheduled for Tuesday, May 20, 2017 at 1440. Let patient know the Breast Center will follow up with her within the next couple weeks with results of mammogram by letter or phone. Discussed smoking cessation with patient. Referred patient to the [facility] Quitline and gave resources to the free smoking cessation classes offered at [facility]. Anita Russell verbalized understanding.  Anita Russell, Kathaleen Maser, RN 2:41 PM

## 2017-05-23 ENCOUNTER — Encounter (HOSPITAL_COMMUNITY): Payer: Self-pay | Admitting: *Deleted

## 2017-06-05 ENCOUNTER — Other Ambulatory Visit: Payer: Self-pay | Admitting: Family Medicine

## 2017-06-05 DIAGNOSIS — J4520 Mild intermittent asthma, uncomplicated: Secondary | ICD-10-CM

## 2017-06-11 ENCOUNTER — Ambulatory Visit: DRG: 310 | Payer: Self-pay | Attending: Cardiology | Admitting: *Deleted

## 2017-06-11 ENCOUNTER — Other Ambulatory Visit: Payer: Self-pay

## 2017-06-11 ENCOUNTER — Emergency Department (HOSPITAL_COMMUNITY): Payer: No Typology Code available for payment source

## 2017-06-11 ENCOUNTER — Inpatient Hospital Stay (HOSPITAL_COMMUNITY)
Admission: EM | Admit: 2017-06-11 | Discharge: 2017-06-17 | DRG: 310 | Disposition: A | Discharge status: Home / Self Care | Payer: Self-pay | Attending: Cardiology | Admitting: Cardiology

## 2017-06-11 VITALS — BP 123/90 | HR 153 | Resp 20 | Wt 177.0 lb

## 2017-06-11 DIAGNOSIS — Z90710 Acquired absence of both cervix and uterus: Secondary | ICD-10-CM

## 2017-06-11 DIAGNOSIS — E785 Hyperlipidemia, unspecified: Secondary | ICD-10-CM | POA: Diagnosis present

## 2017-06-11 DIAGNOSIS — Z23 Encounter for immunization: Secondary | ICD-10-CM

## 2017-06-11 DIAGNOSIS — E876 Hypokalemia: Secondary | ICD-10-CM

## 2017-06-11 DIAGNOSIS — F329 Major depressive disorder, single episode, unspecified: Secondary | ICD-10-CM

## 2017-06-11 DIAGNOSIS — Z720 Tobacco use: Secondary | ICD-10-CM

## 2017-06-11 DIAGNOSIS — Z8261 Family history of arthritis: Secondary | ICD-10-CM

## 2017-06-11 DIAGNOSIS — M109 Gout, unspecified: Secondary | ICD-10-CM | POA: Diagnosis present

## 2017-06-11 DIAGNOSIS — I4892 Unspecified atrial flutter: Secondary | ICD-10-CM | POA: Diagnosis present

## 2017-06-11 DIAGNOSIS — E039 Hypothyroidism, unspecified: Secondary | ICD-10-CM | POA: Diagnosis present

## 2017-06-11 DIAGNOSIS — R079 Chest pain, unspecified: Secondary | ICD-10-CM

## 2017-06-11 DIAGNOSIS — I480 Paroxysmal atrial fibrillation: Principal | ICD-10-CM | POA: Diagnosis present

## 2017-06-11 DIAGNOSIS — F419 Anxiety disorder, unspecified: Secondary | ICD-10-CM | POA: Diagnosis present

## 2017-06-11 DIAGNOSIS — R0789 Other chest pain: Secondary | ICD-10-CM

## 2017-06-11 DIAGNOSIS — F1010 Alcohol abuse, uncomplicated: Secondary | ICD-10-CM | POA: Diagnosis present

## 2017-06-11 DIAGNOSIS — Z9114 Patient's other noncompliance with medication regimen: Secondary | ICD-10-CM

## 2017-06-11 DIAGNOSIS — Z8249 Family history of ischemic heart disease and other diseases of the circulatory system: Secondary | ICD-10-CM

## 2017-06-11 DIAGNOSIS — Z888 Allergy status to other drugs, medicaments and biological substances status: Secondary | ICD-10-CM

## 2017-06-11 DIAGNOSIS — I4891 Unspecified atrial fibrillation: Secondary | ICD-10-CM | POA: Diagnosis present

## 2017-06-11 DIAGNOSIS — I10 Essential (primary) hypertension: Secondary | ICD-10-CM | POA: Diagnosis present

## 2017-06-11 DIAGNOSIS — T50905A Adverse effect of unspecified drugs, medicaments and biological substances, initial encounter: Secondary | ICD-10-CM | POA: Diagnosis present

## 2017-06-11 DIAGNOSIS — I471 Supraventricular tachycardia: Secondary | ICD-10-CM | POA: Diagnosis present

## 2017-06-11 DIAGNOSIS — Z9049 Acquired absence of other specified parts of digestive tract: Secondary | ICD-10-CM

## 2017-06-11 DIAGNOSIS — F17210 Nicotine dependence, cigarettes, uncomplicated: Secondary | ICD-10-CM | POA: Diagnosis present

## 2017-06-11 DIAGNOSIS — J45909 Unspecified asthma, uncomplicated: Secondary | ICD-10-CM | POA: Diagnosis present

## 2017-06-11 DIAGNOSIS — R9431 Abnormal electrocardiogram [ECG] [EKG]: Secondary | ICD-10-CM

## 2017-06-11 LAB — BASIC METABOLIC PANEL
ANION GAP: 13 (ref 5–15)
BUN: 12 mg/dL (ref 6–20)
CALCIUM: 9.4 mg/dL (ref 8.9–10.3)
CHLORIDE: 96 mmol/L — AB (ref 101–111)
CO2: 23 mmol/L (ref 22–32)
Creatinine, Ser: 1.13 mg/dL — ABNORMAL HIGH (ref 0.44–1.00)
GFR, EST AFRICAN AMERICAN: 60 mL/min — AB (ref >60)
GFR, EST NON AFRICAN AMERICAN: 51 mL/min — AB (ref >60)
Glucose, Bld: 116 mg/dL — ABNORMAL HIGH (ref 65–99)
POTASSIUM: 3.6 mmol/L (ref 3.5–5.1)
SODIUM: 132 mmol/L — AB (ref 135–145)

## 2017-06-11 LAB — CBC
HEMATOCRIT: 48 % — AB (ref 36.0–46.0)
Hemoglobin: 16.4 g/dL — ABNORMAL HIGH (ref 12.0–15.0)
MCH: 30.8 pg (ref 26.0–34.0)
MCHC: 34.2 g/dL (ref 30.0–36.0)
MCV: 90.1 fL (ref 78.0–100.0)
Platelets: 196 10*3/uL (ref 150–400)
RBC: 5.33 MIL/uL — ABNORMAL HIGH (ref 3.87–5.11)
RDW: 13.1 % (ref 11.5–15.5)
WBC: 6.5 10*3/uL (ref 4.0–10.5)

## 2017-06-11 LAB — CBC WITH DIFFERENTIAL/PLATELET
BASOS PCT: 0 %
Basophils Absolute: 0 10*3/uL (ref 0.0–0.1)
Eosinophils Absolute: 0.2 10*3/uL (ref 0.0–0.7)
Eosinophils Relative: 3 %
HEMATOCRIT: 44 % (ref 36.0–46.0)
HEMOGLOBIN: 15 g/dL (ref 12.0–15.0)
LYMPHS ABS: 2.6 10*3/uL (ref 0.7–4.0)
LYMPHS PCT: 39 %
MCH: 30.5 pg (ref 26.0–34.0)
MCHC: 34.1 g/dL (ref 30.0–36.0)
MCV: 89.4 fL (ref 78.0–100.0)
MONOS PCT: 8 %
Monocytes Absolute: 0.5 10*3/uL (ref 0.1–1.0)
NEUTROS ABS: 3.2 10*3/uL (ref 1.7–7.7)
Neutrophils Relative %: 50 %
Platelets: 222 10*3/uL (ref 150–400)
RBC: 4.92 MIL/uL (ref 3.87–5.11)
RDW: 13 % (ref 11.5–15.5)
WBC: 6.5 10*3/uL (ref 4.0–10.5)

## 2017-06-11 LAB — HEPARIN LEVEL (UNFRACTIONATED): Heparin Unfractionated: 0.3 IU/mL (ref 0.30–0.70)

## 2017-06-11 LAB — I-STAT TROPONIN, ED: Troponin i, poc: 0.02 ng/mL (ref 0.00–0.08)

## 2017-06-11 LAB — MRSA PCR SCREENING: MRSA BY PCR: NEGATIVE

## 2017-06-11 LAB — TSH: TSH: 2.124 u[IU]/mL (ref 0.350–4.500)

## 2017-06-11 LAB — MAGNESIUM: MAGNESIUM: 1.6 mg/dL — AB (ref 1.7–2.4)

## 2017-06-11 MED ORDER — INFLUENZA VAC SPLIT QUAD 0.5 ML IM SUSY
0.5000 mL | PREFILLED_SYRINGE | INTRAMUSCULAR | Status: AC
  Administered 2017-06-14: 0.5 mL via INTRAMUSCULAR
  Filled 2017-06-11: qty 0.5

## 2017-06-11 MED ORDER — HEPARIN BOLUS VIA INFUSION
4500.0000 [IU] | Freq: Once | INTRAVENOUS | Status: AC
  Administered 2017-06-11: 4500 [IU] via INTRAVENOUS
  Filled 2017-06-11: qty 4500

## 2017-06-11 MED ORDER — ALPRAZOLAM 0.25 MG PO TABS
0.2500 mg | ORAL_TABLET | Freq: Two times a day (BID) | ORAL | Status: DC | PRN
  Administered 2017-06-12 – 2017-06-16 (×7): 0.25 mg via ORAL
  Filled 2017-06-11 (×7): qty 1

## 2017-06-11 MED ORDER — METOPROLOL TARTRATE 25 MG PO TABS: 50.0000 mg | ORAL_TABLET | Freq: Once | ORAL | Status: DC

## 2017-06-11 MED ORDER — METOPROLOL TARTRATE 5 MG/5ML IV SOLN
5.0000 mg | Freq: Once | INTRAVENOUS | Status: AC
  Administered 2017-06-11: 5 mg via INTRAVENOUS

## 2017-06-11 MED ORDER — DILTIAZEM HCL 100 MG IV SOLR
5.0000 mg/h | INTRAVENOUS | Status: DC
  Administered 2017-06-11: 5 mg/h via INTRAVENOUS
  Filled 2017-06-11: qty 100

## 2017-06-11 MED ORDER — SERTRALINE HCL 100 MG PO TABS
100.0000 mg | ORAL_TABLET | Freq: Every day | ORAL | Status: DC
  Administered 2017-06-11 – 2017-06-17 (×7): 100 mg via ORAL
  Filled 2017-06-11: qty 2
  Filled 2017-06-11 (×6): qty 1

## 2017-06-11 MED ORDER — ASPIRIN 81 MG PO CHEW
324.0000 mg | CHEWABLE_TABLET | ORAL | Status: AC
  Administered 2017-06-11: 324 mg via ORAL
  Filled 2017-06-11: qty 4

## 2017-06-11 MED ORDER — METOPROLOL TARTRATE 5 MG/5ML IV SOLN: 10.0000 mg | Freq: Once | INTRAVENOUS | Status: DC

## 2017-06-11 MED ORDER — ASPIRIN 300 MG RE SUPP: 300.0000 mg | RECTAL | Status: AC

## 2017-06-11 MED ORDER — ENSURE ENLIVE PO LIQD
237.0000 mL | Freq: Two times a day (BID) | ORAL | Status: DC
  Administered 2017-06-12: 237 mL via ORAL

## 2017-06-11 MED ORDER — METOPROLOL TARTRATE 5 MG/5ML IV SOLN
INTRAVENOUS | Status: AC
  Administered 2017-06-11: 5 mg
  Filled 2017-06-11: qty 10

## 2017-06-11 MED ORDER — NITROGLYCERIN 0.4 MG SL SUBL: 0.4000 mg | SUBLINGUAL_TABLET | SUBLINGUAL | Status: DC | PRN

## 2017-06-11 MED ORDER — METOPROLOL TARTRATE 25 MG PO TABS
50.0000 mg | ORAL_TABLET | Freq: Once | ORAL | Status: AC
  Administered 2017-06-11: 50 mg via ORAL
  Filled 2017-06-11: qty 2

## 2017-06-11 MED ORDER — AMIODARONE HCL IN DEXTROSE 360-4.14 MG/200ML-% IV SOLN
60.0000 mg/h | INTRAVENOUS | Status: AC
  Administered 2017-06-11: 60 mg/h via INTRAVENOUS
  Filled 2017-06-11 (×2): qty 200

## 2017-06-11 MED ORDER — ONDANSETRON HCL 4 MG/2ML IJ SOLN: 4.0000 mg | Freq: Four times a day (QID) | INTRAMUSCULAR | Status: DC | PRN

## 2017-06-11 MED ORDER — METOPROLOL TARTRATE 50 MG PO TABS
50.0000 mg | ORAL_TABLET | Freq: Two times a day (BID) | ORAL | Status: DC
  Administered 2017-06-11 – 2017-06-16 (×11): 50 mg via ORAL
  Filled 2017-06-11 (×11): qty 1

## 2017-06-11 MED ORDER — DILTIAZEM LOAD VIA INFUSION
10.0000 mg | Freq: Once | INTRAVENOUS | Status: AC
  Administered 2017-06-11: 10 mg via INTRAVENOUS
  Filled 2017-06-11: qty 10

## 2017-06-11 MED ORDER — ATORVASTATIN CALCIUM 20 MG PO TABS
20.0000 mg | ORAL_TABLET | Freq: Every day | ORAL | Status: DC
  Administered 2017-06-11: 20 mg via ORAL
  Filled 2017-06-11: qty 1

## 2017-06-11 MED ORDER — LEVOTHYROXINE SODIUM 100 MCG PO TABS
100.0000 ug | ORAL_TABLET | Freq: Every day | ORAL | Status: DC
  Administered 2017-06-12 – 2017-06-17 (×6): 100 ug via ORAL
  Filled 2017-06-11 (×5): qty 1
  Filled 2017-06-11: qty 2

## 2017-06-11 MED ORDER — AMIODARONE HCL IN DEXTROSE 360-4.14 MG/200ML-% IV SOLN
30.0000 mg/h | INTRAVENOUS | Status: DC
  Administered 2017-06-12 – 2017-06-13 (×3): 30 mg/h via INTRAVENOUS
  Filled 2017-06-11 (×3): qty 200

## 2017-06-11 MED ORDER — ASPIRIN EC 81 MG PO TBEC
81.0000 mg | DELAYED_RELEASE_TABLET | Freq: Every day | ORAL | Status: DC
  Administered 2017-06-12 – 2017-06-17 (×6): 81 mg via ORAL
  Filled 2017-06-11 (×6): qty 1

## 2017-06-11 MED ORDER — ACETAMINOPHEN 325 MG PO TABS
650.0000 mg | ORAL_TABLET | ORAL | Status: DC | PRN
  Administered 2017-06-11 – 2017-06-16 (×3): 650 mg via ORAL
  Filled 2017-06-11 (×3): qty 2

## 2017-06-11 MED ORDER — HEPARIN (PORCINE) IN NACL 100-0.45 UNIT/ML-% IJ SOLN
1400.0000 [IU]/h | INTRAMUSCULAR | Status: DC
  Administered 2017-06-11: 1200 [IU]/h via INTRAVENOUS
  Administered 2017-06-12: 1300 [IU]/h via INTRAVENOUS
  Filled 2017-06-11 (×3): qty 250

## 2017-06-11 MED ORDER — AMIODARONE LOAD VIA INFUSION
150.0000 mg | Freq: Once | INTRAVENOUS | Status: AC
  Administered 2017-06-11: 150 mg via INTRAVENOUS
  Filled 2017-06-11: qty 83.34

## 2017-06-11 NOTE — ED Notes (Signed)
Attempted report x1 to 2C 

## 2017-06-11 NOTE — Progress Notes (Signed)
ANTICOAGULATION CONSULT NOTE - Initial Consult  Pharmacy Consult for heparin dosing Indication: atrial fibrillation  Allergies  Allergen Reactions  . Hyzaar [Losartan Potassium-Hctz] Other (See Comments)    Caused patient to feel light-headed and gave her vertigo  . Meloxicam Rash    A "very bad" rash    Patient Measurements: Height: 5\' 9"  (175.3 cm) Weight: 177 lb (80.3 kg) IBW/kg (Calculated) : 66.2 Heparin Dosing Weight: 80.3  Vital Signs: Temp: 98.1 F (36.7 C) (10/17 1620) Temp Source: Oral (10/17 1620) BP: 120/90 (10/17 1630) Pulse Rate: 55 (10/17 1630)  Labs:  Recent Labs  06/11/17 1612  HGB 16.4*  HCT 48.0*  PLT 196  CREATININE 1.13*    Estimated Creatinine Clearance: 59.3 mL/min (A) (by C-G formula based on SCr of 1.13 mg/dL (H)).   Medical History: Past Medical History:  Diagnosis Date  . Anxiety   . Asthma   . Depression   . Gout   . Hypertension   . Hypothyroidism   . Mental disorder   . Thyroid disease     Medications:  Scheduled:  . metoprolol tartrate  50 mg Oral Once    Assessment: [age] YOF admitted with heart racing. Hgb 16.4, Hct 48.0, Plt 196. On metoprolol PTA. No anticoag PTA, not reported bleeding. EKG SVT vs atrial flutter with RVR.   Goal of Therapy:  Heparin level 0.3-0.7 units/ml Monitor platelets by anticoagulation protocol: Yes   Plan:  Give 4500 units bolus x 1 Start heparin infusion at 1200 units/hr  6 hour heparin level, daily heparin level and CBC Monitor s/sx of bleeding.   Emeline GeneralWhitney Laith Antonelli, PharmD Candidate 06/11/2017,5:30 PM
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
## 2017-06-11 NOTE — ED Notes (Signed)
Jillyn HiddenGary [phone] Katie - [phone]

## 2017-06-11 NOTE — Progress Notes (Signed)
  Amiodarone Drug - Drug Interaction Consult Note  Recommendations: Patient on beta blocker and CCB inpatient- monitor heart rate and EKG for  Bradycardia, AV block and myocardial depression.  PTA, patient on statin, losartan/HCTZ and beta blocker. If resumed, should monitor for muscle pain or weakness, electrolytes/hypokalemia and heart rate.   Amiodarone is metabolized by the cytochrome P450 system and therefore has the potential to cause many drug interactions. Amiodarone has an average plasma half-life of 50 days (range 20 to 100 days).   There is potential for drug interactions to occur several weeks or months after stopping treatment and the onset of drug interactions may be slow after initiating amiodarone.   [x]  Statins: Increased risk of myopathy. Simvastatin- restrict dose to 20mg  daily. Other statins: counsel patients to report any muscle pain or weakness immediately.  []  Anticoagulants: Amiodarone can increase anticoagulant effect. Consider warfarin dose reduction. Patients should be monitored closely and the dose of anticoagulant altered accordingly, remembering that amiodarone levels take several weeks to stabilize.  []  Antiepileptics: Amiodarone can increase plasma concentration of phenytoin, the dose should be reduced. Note that small changes in phenytoin dose can result in large changes in levels. Monitor patient and counsel on signs of toxicity.  [x]  Beta blockers: increased risk of bradycardia, AV block and myocardial depression. Sotalol - avoid concomitant use.  [x]   Calcium channel blockers (diltiazem and verapamil): increased risk of bradycardia, AV block and myocardial depression.  []   Cyclosporine: Amiodarone increases levels of cyclosporine. Reduced dose of cyclosporine is recommended.  []  Digoxin dose should be halved when amiodarone is started.  [x]  Diuretics: increased risk of cardiotoxicity if hypokalemia occurs.  []  Oral hypoglycemic agents (glyburide,  glipizide, glimepiride): increased risk of hypoglycemia. Patient's glucose levels should be monitored closely when initiating amiodarone therapy.   []  Drugs that prolong the QT interval:  Torsades de pointes risk may be increased with concurrent use - avoid if possible.  Monitor QTc, also keep magnesium/potassium WNL if concurrent therapy can't be avoided. Marland Kitchen. Antibiotics: e.g. fluoroquinolones, erythromycin. . Antiarrhythmics: e.g. quinidine, procainamide, disopyramide, sotalol. . Antipsychotics: e.g. phenothiazines, haloperidol.  . Lithium, tricyclic antidepressants, and methadone. Thank You,  Emeline GeneralWhitney Neng Albee , PharmD Candidate 06/11/2017 6:34 PM

## 2017-06-11 NOTE — H&P (Signed)
Anita Russell is an 61 y.o. female.   Chief Complaint:  [id number] is 61 year old female with past medical history significant for hypertension, hyperlipidemia, hypothyroidism, depression, anxiety disorder, history of bronchial asthma, gouty arthritis, tobacco abuse, EtOH abuse,[facility]: [facility] to pick up her medications as she ran out for last few days patient complained of palpitations associated with feeling weak and dizzy for last few days as she ran out of the medication atient was noted to be in SVT EMS was called patient received 6 mg of IV adenosine  followed by 12 mg with transient conversion to atrial flutter with slow ventricular response and subsequently converted back to SVT. In ED patient received 50 mg of by mouth Lopressor and 10 mg of IV Cardizem bolus and was started on10 mg drip without significant improvement in her heart rate. Patient presently has received 5 mg IV Lopressor and second dose has been given now.  Past Medical History:  Diagnosis Date  . Anxiety   . Asthma   . Depression   . Gout   . Hypertension   . Hypothyroidism   . Mental disorder   . Thyroid disease     Past Surgical History:  Procedure Laterality Date  . ABDOMINAL HYSTERECTOMY    . APPENDECTOMY      Family History  Problem Relation Age of Onset  . Arthritis Father   . Hypertension Father   . Gout Father   . Hypertension Mother    Social History:  reports that she has been smoking Cigarettes.  She has been smoking about 1.00 pack per day. She has never used smokeless tobacco. She reports that she drinks about 2.4 - 4.2 oz of alcohol per week . She reports that she does not use drugs.  Allergies:  Allergies  Allergen Reactions  . Hyzaar [Losartan Potassium-Hctz] Other (See Comments)    Caused patient to feel light-headed and gave her vertigo  . Meloxicam Rash    A "very bad" rash     (Not in a hospital admission)  Results for orders placed or performed during the  hospital encounter of 06/11/17 (from the past 48 hour(s))  Basic metabolic panel     Status: Abnormal   Collection Time: 06/11/17  4:12 PM  Result Value Ref Range   Sodium 132 (L) 135 - 145 mmol/L   Potassium 3.6 3.5 - 5.1 mmol/L   Chloride 96 (L) 101 - 111 mmol/L   CO2 23 22 - 32 mmol/L   Glucose, Bld 116 (H) 65 - 99 mg/dL   BUN 12 6 - 20 mg/dL   Creatinine, Ser 1.13 (H) 0.44 - 1.00 mg/dL   Calcium 9.4 8.9 - 10.3 mg/dL   GFR calc non Af Amer 51 (L) >60 mL/min   GFR calc Af Amer 60 (L) >60 mL/min    Comment: (NOTE) The eGFR has been calculated using the CKD EPI equation. This calculation has not been validated in all clinical situations. eGFR's persistently <60 mL/min signify possible Chronic Kidney Disease.    Anion gap 13 5 - 15  CBC     Status: Abnormal   Collection Time: 06/11/17  4:12 PM  Result Value Ref Range   WBC 6.5 4.0 - 10.5 K/uL   RBC 5.33 (H) 3.87 - 5.11 MIL/uL   Hemoglobin 16.4 (H) 12.0 - 15.0 g/dL   HCT 48.0 (H) 36.0 - 46.0 %   MCV 90.1 78.0 - 100.0 fL   MCH 30.8 26.0 - 34.0 pg  MCHC 34.2 30.0 - 36.0 g/dL   RDW 13.1 11.5 - 15.5 %   Platelets 196 150 - 400 K/uL  Magnesium     Status: Abnormal   Collection Time: 06/11/17  4:12 PM  Result Value Ref Range   Magnesium 1.6 (L) 1.7 - 2.4 mg/dL  I-stat troponin, ED     Status: None   Collection Time: 06/11/17  4:24 PM  Result Value Ref Range   Troponin i, poc 0.02 0.00 - 0.08 ng/mL   Comment 3            Comment: Due to the release kinetics of cTnI, a negative result within the first hours of the onset of symptoms does not rule out myocardial infarction with certainty. If myocardial infarction is still suspected, repeat the test at appropriate intervals.   TSH     Status: None   Collection Time: 06/11/17  4:35 PM  Result Value Ref Range   TSH 2.124 0.350 - 4.500 uIU/mL    Comment: Performed by a 3rd Generation assay with a functional sensitivity of <=0.01 uIU/mL.   Dg Chest Port 1 View  Result Date:  06/11/2017 CLINICAL DATA:  Palpitations EXAM: PORTABLE CHEST 1 VIEW COMPARISON:  09/06/2012 FINDINGS: No acute consolidation or pleural effusion. Normal heart size. No pneumothorax. Round opacity over the lower mediastinum. IMPRESSION: 1. Negative for edema or consolidation 2. Round opacity over the lower mediastinum may reflect hiatal hernia Electronically Signed   By: Donavan Foil M.D.   On: 06/11/2017 17:45    Review of Systems  Constitutional: Positive for malaise/fatigue. Negative for chills and fever.  HENT: Positive for hearing loss.   Eyes: Negative for blurred vision.  Respiratory: Positive for shortness of breath.   Cardiovascular: Positive for chest pain. Negative for orthopnea and claudication.  Gastrointestinal: Positive for nausea and vomiting.  Genitourinary: Negative for dysuria.  Neurological: Positive for dizziness and weakness.    Blood pressure (!) 119/97, pulse (!) 151, temperature 98.1 F (36.7 C), temperature source Oral, resp. rate 11, height 5' 9" (1.753 m), weight 80.3 kg (177 lb), SpO2 97 %. Physical Exam  Constitutional: She is oriented to person, place, and time.  HENT:  Head: Normocephalic and atraumatic.  Eyes: Pupils are equal, round, and reactive to light. Conjunctivae are normal. Left eye exhibits discharge. No scleral icterus.  Neck: Normal range of motion. Neck supple. No JVD present. No tracheal deviation present. No thyromegaly present.  Cardiovascular:  Tachycardic S1 and S2 soft  Respiratory: Effort normal and breath sounds normal. No respiratory distress. She has no wheezes. She has no rales.  GI: Soft. Bowel sounds are normal. She exhibits no distension. There is no tenderness. There is no rebound.  Musculoskeletal: She exhibits no edema, tenderness or deformity.  Neurological: She is alert and oriented to person, place, and time.     Assessment/Plan SVT Possible A flutter with 2 to one block Atypical chest  pain Hypertension Hyperlipidemia Hypothyroidism History of bronchial asthma Gouty arthritis Tobacco abuse EtOH abuse Depression Anxiety disorder Plan As per orders  Charolette Forward, MD 06/11/2017, 6:12 PM

## 2017-06-11 NOTE — Progress Notes (Signed)
[facility] She requested blood pressure check while at facility. She states she has not taken BP medication (metoprolol) in 3 days and took last dose of thyroid medication (levothyroxine) today.  With BP check, pt heart rate was 150's.  Pt appears shaky, c/o chest pressure, SOB and dizziness when lying down. She became diaphoretic upon arrival of EMS.  EKG rhythms shows HR- 153-160, SVT EMS called Medication (Adenosine) administered by EMS while in office. Heart rate when boarding EMS was 153 on monitor.

## 2017-06-11 NOTE — Progress Notes (Signed)
ANTICOAGULATION CONSULT NOTE - F/u Consult  Pharmacy Consult for heparin dosing Indication: atrial fibrillation  Allergies  Allergen Reactions  . Hyzaar [Losartan Potassium-Hctz] Other (See Comments)    Caused patient to feel light-headed and gave her vertigo  . Meloxicam Rash    A "very bad" rash   Patient Measurements: Height: 5\' 9"  (175.3 cm) Weight: 177 lb 11.1 oz (80.6 kg) IBW/kg (Calculated) : 66.2 Heparin Dosing Weight: 80.3  Vital Signs: Temp: 97.9 F (36.6 C) (10/17 2310) Temp Source: Oral (10/17 2310) BP: 95/72 (10/17 2310) Pulse Rate: 112 (10/17 2310)  Labs:  Recent Labs  06/11/17 1612 06/11/17 2302  HGB 16.4* 15.0  HCT 48.0* 44.0  PLT 196 222  HEPARINUNFRC  --  0.30  CREATININE 1.13*  --     Estimated Creatinine Clearance: 59.4 mL/min (A) (by C-G formula based on SCr of 1.13 mg/dL (H)).   Medical History: Past Medical History:  Diagnosis Date  . Anxiety   . Asthma   . Depression   . Gout   . Hypertension   . Hypothyroidism   . Mental disorder   . Thyroid disease     Medications:  Scheduled:  . [START ON 06/12/2017] aspirin EC  81 mg Oral Daily  . atorvastatin  20 mg Oral QHS  . [START ON 06/12/2017] feeding supplement (ENSURE ENLIVE)  237 mL Oral BID BM  . [START ON 06/12/2017] Influenza vac split quadrivalent PF  0.5 mL Intramuscular Tomorrow-1000  . [START ON 06/12/2017] levothyroxine  100 mcg Oral QAC breakfast  . metoprolol tartrate  50 mg Oral BID  . sertraline  100 mg Oral Daily   Assessment: [age] YOF admitted with palpitations; SVT vs atrial flutter per notes. Pharmacy consulted to dose heparin.   Heparin level therapeutic at 0.30. No new CBC, no s/s bleeding documented.    Goal of Therapy:  Heparin level 0.3-0.7 units/ml Monitor platelets by anticoagulation protocol: Yes   Plan:  Continue heparin gtt at 1200 units/hr Confirm heparin level with AM labs  Daily heparin level and CBC Monitor for s/s bleeding F/u long-term AC  plan   Einar CrowKatherine Weigle, PharmD Clinical Pharmacist 06/11/17 11:57 PM

## 2017-06-11 NOTE — ED Triage Notes (Signed)
Pt BIB [facility] for her heart racing. Was seen at doctors they did an EKG and called 911. PT states the last few days she has been feeling her heart race with intermittent chest pain, SHOB, and Dizzy. EMS administered 6mg  and then 12mg  of adenosine. EMS stated when it slowed down it looked like flutter

## 2017-06-12 ENCOUNTER — Encounter (HOSPITAL_COMMUNITY): Payer: Self-pay

## 2017-06-12 ENCOUNTER — Observation Stay (HOSPITAL_COMMUNITY): Payer: Self-pay

## 2017-06-12 DIAGNOSIS — I4892 Unspecified atrial flutter: Secondary | ICD-10-CM | POA: Insufficient documentation

## 2017-06-12 DIAGNOSIS — I10 Essential (primary) hypertension: Secondary | ICD-10-CM | POA: Insufficient documentation

## 2017-06-12 DIAGNOSIS — E039 Hypothyroidism, unspecified: Secondary | ICD-10-CM

## 2017-06-12 DIAGNOSIS — E785 Hyperlipidemia, unspecified: Secondary | ICD-10-CM | POA: Insufficient documentation

## 2017-06-12 DIAGNOSIS — M109 Gout, unspecified: Secondary | ICD-10-CM

## 2017-06-12 DIAGNOSIS — Z720 Tobacco use: Secondary | ICD-10-CM | POA: Insufficient documentation

## 2017-06-12 DIAGNOSIS — F419 Anxiety disorder, unspecified: Secondary | ICD-10-CM | POA: Insufficient documentation

## 2017-06-12 DIAGNOSIS — J45909 Unspecified asthma, uncomplicated: Secondary | ICD-10-CM | POA: Insufficient documentation

## 2017-06-12 DIAGNOSIS — F329 Major depressive disorder, single episode, unspecified: Secondary | ICD-10-CM

## 2017-06-12 LAB — LIPID PANEL
CHOL/HDL RATIO: 3.5 ratio
Cholesterol: 253 mg/dL — ABNORMAL HIGH (ref 0–200)
HDL: 72 mg/dL (ref >40)
LDL CALC: 139 mg/dL — AB (ref 0–99)
Triglycerides: 208 mg/dL — ABNORMAL HIGH (ref <150)
VLDL: 42 mg/dL — AB (ref 0–40)

## 2017-06-12 LAB — TSH: TSH: 5.232 u[IU]/mL — ABNORMAL HIGH (ref 0.350–4.500)

## 2017-06-12 LAB — BASIC METABOLIC PANEL
Anion gap: 13 (ref 5–15)
BUN: 13 mg/dL (ref 6–20)
CHLORIDE: 98 mmol/L — AB (ref 101–111)
CO2: 24 mmol/L (ref 22–32)
Calcium: 9.1 mg/dL (ref 8.9–10.3)
Creatinine, Ser: 1.1 mg/dL — ABNORMAL HIGH (ref 0.44–1.00)
GFR calc Af Amer: >60 mL/min (ref >60)
GFR, EST NON AFRICAN AMERICAN: 53 mL/min — AB (ref >60)
GLUCOSE: 104 mg/dL — AB (ref 65–99)
POTASSIUM: 3.2 mmol/L — AB (ref 3.5–5.1)
Sodium: 135 mmol/L (ref 135–145)

## 2017-06-12 LAB — HIV ANTIBODY (ROUTINE TESTING W REFLEX): HIV Screen 4th Generation wRfx: NONREACTIVE

## 2017-06-12 LAB — COMPREHENSIVE METABOLIC PANEL
ALBUMIN: 3.5 g/dL (ref 3.5–5.0)
ALK PHOS: 55 U/L (ref 38–126)
ALT: 16 U/L (ref 14–54)
AST: 26 U/L (ref 15–41)
Anion gap: 13 (ref 5–15)
BUN: 13 mg/dL (ref 6–20)
CALCIUM: 8.7 mg/dL — AB (ref 8.9–10.3)
CO2: 22 mmol/L (ref 22–32)
CREATININE: 1.2 mg/dL — AB (ref 0.44–1.00)
Chloride: 95 mmol/L — ABNORMAL LOW (ref 101–111)
GFR calc non Af Amer: 48 mL/min — ABNORMAL LOW (ref >60)
GFR, EST AFRICAN AMERICAN: 55 mL/min — AB (ref >60)
GLUCOSE: 126 mg/dL — AB (ref 65–99)
Potassium: 2.9 mmol/L — ABNORMAL LOW (ref 3.5–5.1)
SODIUM: 130 mmol/L — AB (ref 135–145)
Total Bilirubin: 0.5 mg/dL (ref 0.3–1.2)
Total Protein: 6.5 g/dL (ref 6.5–8.1)

## 2017-06-12 LAB — CBC
HCT: 43 % (ref 36.0–46.0)
Hemoglobin: 14.8 g/dL (ref 12.0–15.0)
MCH: 31 pg (ref 26.0–34.0)
MCHC: 34.4 g/dL (ref 30.0–36.0)
MCV: 90 fL (ref 78.0–100.0)
Platelets: 167 10*3/uL (ref 150–400)
RBC: 4.78 MIL/uL (ref 3.87–5.11)
RDW: 12.8 % (ref 11.5–15.5)
WBC: 5.5 10*3/uL (ref 4.0–10.5)

## 2017-06-12 LAB — ECHOCARDIOGRAM COMPLETE
HEIGHTINCHES: 69 in
WEIGHTICAEL: 2843.05 [oz_av]

## 2017-06-12 LAB — TROPONIN I: Troponin I: <0.03 ng/mL (ref <0.03)

## 2017-06-12 LAB — HEPARIN LEVEL (UNFRACTIONATED): Heparin Unfractionated: 0.29 IU/mL — ABNORMAL LOW (ref 0.30–0.70)

## 2017-06-12 LAB — MAGNESIUM: Magnesium: 1.5 mg/dL — ABNORMAL LOW (ref 1.7–2.4)

## 2017-06-12 MED ORDER — ATORVASTATIN CALCIUM 40 MG PO TABS
40.0000 mg | ORAL_TABLET | Freq: Every day | ORAL | Status: DC
  Administered 2017-06-12 – 2017-06-16 (×5): 40 mg via ORAL
  Filled 2017-06-12 (×5): qty 1

## 2017-06-12 NOTE — Progress Notes (Signed)
  Echocardiogram 2D Echocardiogram has been performed.  Celene SkeenVijay  Riel Hirschman 06/12/2017, 11:18 AM

## 2017-06-12 NOTE — Progress Notes (Signed)
ANTICOAGULATION CONSULT NOTE - F/u Consult  Pharmacy Consult for heparin dosing Indication: atrial fibrillation  Allergies  Allergen Reactions  . Hyzaar [Losartan Potassium-Hctz] Other (See Comments)    Caused patient to feel light-headed and gave her vertigo  . Meloxicam Rash    A "very bad" rash   Patient Measurements: Height: 5\' 9"  (175.3 cm) Weight: 177 lb 11.1 oz (80.6 kg) IBW/kg (Calculated) : 66.2 Heparin Dosing Weight: 80.3  Vital Signs: Temp: 97.9 F (36.6 C) (10/18 0304) Temp Source: Oral (10/18 0304) BP: 103/79 (10/18 0304) Pulse Rate: 83 (10/18 0304)  Labs:  Recent Labs  06/11/17 1612 06/11/17 2302 06/12/17 0418  HGB 16.4* 15.0 14.8  HCT 48.0* 44.0 43.0  PLT 196 222 167  HEPARINUNFRC  --  0.30 0.29*  CREATININE 1.13* 1.20* 1.10*  TROPONINI  --  <0.03 <0.03   Estimated Creatinine Clearance: 61 mL/min (A) (by C-G formula based on SCr of 1.1 mg/dL (H)).  Medical History: Past Medical History:  Diagnosis Date  . Anxiety   . Asthma   . Depression   . Gout   . Hypertension   . Hypothyroidism   . Mental disorder   . Thyroid disease    Medications:  Scheduled:  . aspirin EC  81 mg Oral Daily  . atorvastatin  20 mg Oral QHS  . feeding supplement (ENSURE ENLIVE)  237 mL Oral BID BM  . Influenza vac split quadrivalent PF  0.5 mL Intramuscular Tomorrow-1000  . levothyroxine  100 mcg Oral QAC breakfast  . metoprolol tartrate  50 mg Oral BID  . sertraline  100 mg Oral Daily   Assessment: [age] YOF admitted with palpitations; SVT vs atrial flutter per notes. Pharmacy consulted to dose heparin.   Heparin level subtherapeutic: 0.29 Pt transferred from outside hospital; CBC stable, no bleeding or infusion issues reported    Goal of Therapy:  Heparin level 0.3-0.7 units/ml Monitor platelets by anticoagulation protocol: Yes   Plan:  Increase heparin gtt to 1300 units/hr Daily heparin level and CBC Monitor for s/s bleeding F/u long-term AC plan   Ruben Imony  Coreyon Nicotra, PharmD Clinical Pharmacist 06/12/2017 8:03 AM

## 2017-06-12 NOTE — Progress Notes (Signed)
Responded to AD consult . Document let with Nurse for pt.  Chaplain will be page when ready. Venida JarvisWatlington, Antoine Vandermeulen, [place], [facility], Pager [phone]

## 2017-06-12 NOTE — Progress Notes (Signed)
Nutrition Brief Note  Patient identified on the Malnutrition Screening Tool (MST) Report  Wt Readings from Last 15 Encounters:  06/11/17 177 lb 11.1 oz (80.6 kg)  06/11/17 177 lb (80.3 kg)  05/20/17 183 lb (83 kg)  03/11/17 178 lb (80.7 kg)  02/04/17 181 lb (82.1 kg)  07/01/16 178 lb 6.4 oz (80.9 kg)  08/02/15 174 lb 12.8 oz (79.3 kg)  07/13/15 180 lb 3.2 oz (81.7 kg)  01/05/15 194 lb (88 kg)  10/03/14 190 lb (86.2 kg)  04/08/14 184 lb (83.5 kg)  01/06/14 180 lb 6.4 oz (81.8 kg)  10/29/13 179 lb (81.2 kg)  09/06/12 180 lb (81.6 kg)  06/24/12 183 lb (3483 kg)   61 year old female with past medical history significant for hypertension, hyperlipidemia, hypothyroidism, depression, anxiety disorder, history of bronchial asthma, gouty arthritis, tobacco abuse, EtOH abuse,[facility]: [facility] to pick up her medications as she ran out for last few days patient complained of palpitations associated with feeling weak and dizzy for last few days as she ran out of the medication atient was noted to be in SVT  Pt admitted with SVT.   Spoke with pt, who reports UBW around 175#. Pt reports recent wt gain, due to decreased activity secondary to illness. Pt shares that she is very watchful about what she eats and typically consumes 2 meals per day (lean meats, vegetables, and nuts and seeds). Pt consumed 75% of breakfast tray this AM.   Nutrition-Focused physical exam completed. Findings are no fat depletion, no muscle depletion, and no edema.   Body mass index is 26.24 kg/m. Patient meets criteria for overweight based on current BMI.   Current diet order is Heart Healthy, patient is consuming approximately 100% of meals at this time. Labs and medications reviewed.   No nutrition interventions warranted at this time. If nutrition issues arise, please consult RD.   Jequan Shahin A. Mayford KnifeWilliams, RD, LDN, CDE Pager: [phone] After hours Pager: [phone]

## 2017-06-12 NOTE — Progress Notes (Signed)
Subjective:  Patient denies any chest pain or shortness of breath. Remains in A. Fib flutter with moderate 20: Response. States overall feels better eager to go home. Discussed with patient regarding TEE assisted cardioversion versus rate control with medications and chronic anticoagulation, wants to be treated medically only. Patient denies any anginal chest pain.  Objective:  Vital Signs in the last 24 hours: Temp:  [97.6 F (36.4 C)-98.1 F (36.7 C)] 97.6 F (36.4 C) (10/18 0810) Pulse Rate:  [55-153] 100 (10/18 0810) Resp:  [11-23] 15 (10/18 0810) BP: (95-133)/(72-103) 100/73 (10/18 0810) SpO2:  [93 %-98 %] 97 % (10/18 0810) Weight:  [80.3 kg (177 lb)-80.6 kg (177 lb 11.1 oz)] 80.6 kg (177 lb 11.1 oz) (10/17 2051)  Intake/Output from previous day: 10/17 0701 - 10/18 0700 In: 1320.9 [P.O.:100; I.V.:1220.9] Out: -  Intake/Output from this shift: No intake/output data recorded.  Physical Exam: Neck: no adenopathy, no carotid bruit, no JVD and supple, symmetrical, trachea midline Lungs: clear to auscultation bilaterally Heart: irregularly irregular rhythm, S1, S2 normal and soft systolic murmur noted Abdomen: soft, non-tender; bowel sounds normal; no masses,  no organomegaly Extremities: extremities normal, atraumatic, no cyanosis or edema  Lab Results:  Recent Labs  06/11/17 2302 06/12/17 0418  WBC 6.5 5.5  HGB 15.0 14.8  PLT 222 167    Recent Labs  06/11/17 2302 06/12/17 0418  NA 130* 135  K 2.9* 3.2*  CL 95* 98*  CO2 22 24  GLUCOSE 126* 104*  BUN 13 13  CREATININE 1.20* 1.10*    Recent Labs  06/11/17 2302 06/12/17 0418  TROPONINI <0.03 <0.03   Hepatic Function Panel  Recent Labs  06/11/17 2302  PROT 6.5  ALBUMIN 3.5  AST 26  ALT 16  ALKPHOS 55  BILITOT 0.5    Recent Labs  06/12/17 0418  CHOL 253*   No results for input(s): PROTIME in the last 72 hours.  Imaging: Imaging results have been reviewed and Dg Chest Port 1 View  Result  Date: 06/11/2017 CLINICAL DATA:  Palpitations EXAM: PORTABLE CHEST 1 VIEW COMPARISON:  09/06/2012 FINDINGS: No acute consolidation or pleural effusion. Normal heart size. No pneumothorax. Round opacity over the lower mediastinum. IMPRESSION: 1. Negative for edema or consolidation 2. Round opacity over the lower mediastinum may reflect hiatal hernia Electronically Signed   By: Jasmine PangKim  Fujinaga M.D.   On: 06/11/2017 17:45    Cardiac Studies:  Assessment/Plan:  A. Fib flutter with moderate ventricle response Status postAtypical chest pain Hypertension Hyperlipidemia Hypothyroidism History of bronchial asthma Gouty arthritis Tobacco abuse EtOH abuse Depression Anxiety disorder Plan As per orders Check 2-D echo   LOS: 0 days    Rinaldo CloudHarwani, Val Farnam 06/12/2017, 9:19 AM

## 2017-06-13 ENCOUNTER — Ambulatory Visit: Payer: Self-pay | Admitting: Family Medicine

## 2017-06-13 LAB — BASIC METABOLIC PANEL
Anion gap: 8 (ref 5–15)
BUN: 13 mg/dL (ref 6–20)
CO2: 28 mmol/L (ref 22–32)
CREATININE: 0.9 mg/dL (ref 0.44–1.00)
Calcium: 8.9 mg/dL (ref 8.9–10.3)
Chloride: 102 mmol/L (ref 101–111)
GFR calc Af Amer: >60 mL/min (ref >60)
Glucose, Bld: 101 mg/dL — ABNORMAL HIGH (ref 65–99)
Potassium: 3.2 mmol/L — ABNORMAL LOW (ref 3.5–5.1)
SODIUM: 138 mmol/L (ref 135–145)

## 2017-06-13 LAB — CBC
HCT: 40.8 % (ref 36.0–46.0)
Hemoglobin: 13.8 g/dL (ref 12.0–15.0)
MCH: 30.5 pg (ref 26.0–34.0)
MCHC: 33.8 g/dL (ref 30.0–36.0)
MCV: 90.1 fL (ref 78.0–100.0)
PLATELETS: 154 10*3/uL (ref 150–400)
RBC: 4.53 MIL/uL (ref 3.87–5.11)
RDW: 12.8 % (ref 11.5–15.5)
WBC: 4.8 10*3/uL (ref 4.0–10.5)

## 2017-06-13 LAB — HEPARIN LEVEL (UNFRACTIONATED): HEPARIN UNFRACTIONATED: 0.29 [IU]/mL — AB (ref 0.30–0.70)

## 2017-06-13 LAB — MAGNESIUM: MAGNESIUM: 1.4 mg/dL — AB (ref 1.7–2.4)

## 2017-06-13 MED ORDER — AMIODARONE HCL 200 MG PO TABS
200.0000 mg | ORAL_TABLET | Freq: Two times a day (BID) | ORAL | Status: DC
  Administered 2017-06-13 – 2017-06-16 (×8): 200 mg via ORAL
  Filled 2017-06-13 (×8): qty 1

## 2017-06-13 MED ORDER — POTASSIUM CHLORIDE CRYS ER 20 MEQ PO TBCR
40.0000 meq | EXTENDED_RELEASE_TABLET | Freq: Once | ORAL | Status: AC
  Administered 2017-06-13: 40 meq via ORAL
  Filled 2017-06-13: qty 2

## 2017-06-13 MED ORDER — APIXABAN 5 MG PO TABS
5.0000 mg | ORAL_TABLET | Freq: Two times a day (BID) | ORAL | Status: DC
  Administered 2017-06-13 – 2017-06-17 (×9): 5 mg via ORAL
  Filled 2017-06-13 (×9): qty 1

## 2017-06-13 MED ORDER — MAGNESIUM SULFATE 2 GM/50ML IV SOLN
2.0000 g | Freq: Once | INTRAVENOUS | Status: AC
  Administered 2017-06-13: 2 g via INTRAVENOUS
  Filled 2017-06-13: qty 50

## 2017-06-13 NOTE — Progress Notes (Signed)
Subjective:  Patient denies any chest pain or shortness of breath.  Denies any palpitations.  He remains in A. Fib with moderate ventricular response.  Objective:  Vital Signs in the last 24 hours: Temp:  [97.6 F (36.4 C)-98.7 F (37.1 C)] 97.6 F (36.4 C) (10/19 0731) Pulse Rate:  [79-127] 79 (10/19 0731) Resp:  [12-23] 16 (10/19 0731) BP: (89-134)/(69-102) 98/69 (10/19 0731) SpO2:  [95 %-99 %] 96 % (10/19 0731)  Intake/Output from previous day: 10/18 0701 - 10/19 0700 In: 472.5 [P.O.:240; I.V.:232.5] Out: -  Intake/Output from this shift: Total I/O In: 623.7 [I.V.:623.7] Out: -   Physical Exam: Neck: no adenopathy, no carotid bruit, no JVD and supple, symmetrical, trachea midline Lungs: clear to auscultation bilaterally Heart: irregularly irregular rhythm, S1, S2 normal and soft systolic murmur noted Abdomen: irregularly irregular rhythm, S1, S2 normal and soft systolic murmur noted Extremities: extremities normal, atraumatic, no cyanosis or edema  Lab Results:  Recent Labs  06/12/17 0418 06/13/17 0322  WBC 5.5 4.8  HGB 14.8 13.8  PLT 167 154    Recent Labs  06/11/17 2302 06/12/17 0418  NA 130* 135  K 2.9* 3.2*  CL 95* 98*  CO2 22 24  GLUCOSE 126* 104*  BUN 13 13  CREATININE 1.20* 1.10*    Recent Labs  06/12/17 0418 06/12/17 1214  TROPONINI <0.03 <0.03   Hepatic Function Panel  Recent Labs  06/11/17 2302  PROT 6.5  ALBUMIN 3.5  AST 26  ALT 16  ALKPHOS 55  BILITOT 0.5    Recent Labs  06/12/17 0418  CHOL 253*   No results for input(s): PROTIME in the last 72 hours.  Imaging: Imaging results have been reviewed and Dg Chest Port 1 View  Result Date: 06/11/2017 CLINICAL DATA:  Palpitations EXAM: PORTABLE CHEST 1 VIEW COMPARISON:  09/06/2012 FINDINGS: No acute consolidation or pleural effusion. Normal heart size. No pneumothorax. Round opacity over the lower mediastinum. IMPRESSION: 1. Negative for edema or consolidation 2. Round  opacity over the lower mediastinum may reflect hiatal hernia Electronically Signed   By: Jasmine PangKim  Fujinaga M.D.   On: 06/11/2017 17:45    Cardiac Studies:  Assessment/Plan:  A. Fib flutter with moderate ventricle response Status postAtypical chest pain Hypertension hypokalemia Hyperlipidemia Hypothyroidism History of bronchial asthma Gouty arthritis Tobacco abuse EtOH abuse Depression Anxiety disorder Plan Replace K Change heparin to ELIQUIS Continue IV amiodarone until this bottle is complete.  Will switch to by mouth.  Please do not start new bottle of amiodarone. Possible discharge this evening if stable  LOS: 1 day    Rinaldo CloudHarwani, Carlei Huang 06/13/2017, 8:34 AM

## 2017-06-13 NOTE — Progress Notes (Signed)
ANTICOAGULATION CONSULT NOTE - Initial Consult  Pharmacy Consult for eliquis Indication: atrial fibrillation  Allergies  Allergen Reactions  . Hyzaar [Losartan Potassium-Hctz] Other (See Comments)    Caused patient to feel light-headed and gave her vertigo  . Meloxicam Rash    A "very bad" rash   Patient Measurements: Height: 5\' 9"  (175.3 cm) Weight: 177 lb 11.1 oz (80.6 kg) IBW/kg (Calculated) : 66.2   Vital Signs: Temp: 97.6 F (36.4 C) (10/19 0731) Temp Source: Oral (10/19 0731) BP: 98/69 (10/19 0731) Pulse Rate: 79 (10/19 0731)  Labs:  Recent Labs  06/11/17 1612 06/11/17 2302 06/12/17 0418 06/12/17 1214 06/13/17 0322  HGB 16.4* 15.0 14.8  --  13.8  HCT 48.0* 44.0 43.0  --  40.8  PLT 196 222 167  --  154  HEPARINUNFRC  --  0.30 0.29*  --  0.29*  CREATININE 1.13* 1.20* 1.10*  --   --   TROPONINI  --  <0.03 <0.03 <0.03  --    Estimated Creatinine Clearance: 61 mL/min (A) (by C-G formula based on SCr of 1.1 mg/dL (H)).  Medical History: Past Medical History:  Diagnosis Date  . Anxiety   . Asthma   . Depression   . Gout   . Hypertension   . Hypothyroidism   . Mental disorder   . Thyroid disease    Medications:  Prescriptions Prior to Admission  Medication Sig Dispense Refill Last Dose  . atorvastatin (LIPITOR) 20 MG tablet Take 1 tablet (20 mg total) by mouth daily. 30 tablet 6 06/10/2017 at am  . colchicine 0.6 MG tablet Take 1 tablet (0.6 mg total) by mouth daily. 1.2 mg now, repeat 0.6 mg one hour later. (Patient taking differently: Take 0.6-1.2 mg by mouth See admin instructions. 1.2 mg initially, then 0.6 mg one hour later AS DIRECTED FOR GOUT FLARES) 30 tablet 1 PRN at PRN  . hydrocortisone 2.5 % cream Apply topically 2 (two) times daily. (Patient taking differently: Apply 1 application topically 2 (two) times daily. APPLY TO BOTH LEGS) 30 g 2 Past Week at Unknown time  . hydrOXYzine (ATARAX/VISTARIL) 25 MG tablet Take 1 tablet (25 mg total) by mouth  3 (three) times daily as needed. (Patient taking differently: Take 25 mg by mouth 3 (three) times daily as needed for anxiety. ) 90 tablet 3 06/10/2017 at Unknown time  . levothyroxine (SYNTHROID, LEVOTHROID) 100 MCG tablet TAKE 1 TABLET BY MOUTH DAILY BEFORE BREAKFAST FOR THYROID DISEASE. (Patient taking differently: Take 100 mcg by mouth daily before breakfast. FOR THYROID DISEASE) 30 tablet 6 06/11/2017 at am  . metoprolol tartrate (LOPRESSOR) 50 MG tablet Take 1 tablet (50 mg total) by mouth 2 (two) times daily. 180 tablet 6 06/08/2017 at 1000  . PROVENTIL HFA 108 (90 Base) MCG/ACT inhaler INHALE 2 PUFFS INTO THE LUNGS EVERY 6 HOURS AS NEEDED. FOR SHORTNESS OF BREATH (Patient taking differently: Inhale 2 puffs into the lungs every 6 hours as needed for shortness of breath) 6.7 g 1 06/10/2017 at Unknown time  . sertraline (ZOLOFT) 100 MG tablet TAKE 1 TABLET BY MOUTH ONCE DAILY FOR ANXIETY AND DEPRESSION (Patient taking differently: Take 100 mg by mouth daily. FOR ANXIETY AND DEPRESSION) 30 tablet 6 06/10/2017 at am  . losartan-hydrochlorothiazide (HYZAAR) 50-12.5 MG tablet Take 1 tablet by mouth daily. (Patient not taking: Reported on 06/11/2017) 30 tablet 5 Not Taking at Unknown time   Assessment: 61 YOF admitted with palpitations; SVT vs atrial flutter per notes. Patient transitioning from  IV heparin to oral Eliquis; Weight 80kg, Age 61, sCr 1.1; CBC stable WNL  Goal of Therapy:  Monitor platelets by anticoagulation protocol: Yes   Plan:  Eliquis 5mg  BID Monitor renal function and for s/sx of bleeding  Taino Maertens L Jahir Halt 06/13/2017,8:46 AM

## 2017-06-14 LAB — BASIC METABOLIC PANEL
Anion gap: 9 (ref 5–15)
BUN: 12 mg/dL (ref 6–20)
CALCIUM: 9.2 mg/dL (ref 8.9–10.3)
CO2: 29 mmol/L (ref 22–32)
Chloride: 100 mmol/L — ABNORMAL LOW (ref 101–111)
Creatinine, Ser: 0.89 mg/dL (ref 0.44–1.00)
GFR calc Af Amer: >60 mL/min (ref >60)
GLUCOSE: 99 mg/dL (ref 65–99)
Potassium: 4.2 mmol/L (ref 3.5–5.1)
Sodium: 138 mmol/L (ref 135–145)

## 2017-06-14 LAB — CBC
HCT: 41.9 % (ref 36.0–46.0)
Hemoglobin: 14.3 g/dL (ref 12.0–15.0)
MCH: 31 pg (ref 26.0–34.0)
MCHC: 34.1 g/dL (ref 30.0–36.0)
MCV: 90.9 fL (ref 78.0–100.0)
PLATELETS: 139 10*3/uL — AB (ref 150–400)
RBC: 4.61 MIL/uL (ref 3.87–5.11)
RDW: 12.8 % (ref 11.5–15.5)
WBC: 4.6 10*3/uL (ref 4.0–10.5)

## 2017-06-14 MED ORDER — DILTIAZEM HCL 30 MG PO TABS
30.0000 mg | ORAL_TABLET | Freq: Four times a day (QID) | ORAL | Status: DC
  Administered 2017-06-14 – 2017-06-15 (×4): 30 mg via ORAL
  Filled 2017-06-14 (×4): qty 1

## 2017-06-14 NOTE — Progress Notes (Signed)
Pt request a xanax and ask that she not have any visitors at this time. She states if she does not go home today she would like a nicotine patch. Pt states she wants to rest for a while then she will be ready to ambulate in the hall. Sign placed on pt's door to see RN before entering.

## 2017-06-14 NOTE — Discharge Instructions (Addendum)
Atrial Fibrillation Atrial fibrillation is a type of irregular or rapid heartbeat (arrhythmia). In atrial fibrillation, the heart quivers continuously in a chaotic pattern. This occurs when parts of the heart receive disorganized signals that make the heart unable to pump blood normally. This can increase the risk for stroke, heart failure, and other heart-related conditions. There are different types of atrial fibrillation, including:  Paroxysmal atrial fibrillation. This type starts suddenly, and it usually stops on its own shortly after it starts.  Persistent atrial fibrillation. This type often lasts longer than a week. It may stop on its own or with treatment.  Long-lasting persistent atrial fibrillation. This type lasts longer than 12 months.  Permanent atrial fibrillation. This type does not go away.  Talk with your health care provider to learn about the type of atrial fibrillation that you have. What are the causes? This condition is caused by some heart-related conditions or procedures, including:  A heart attack.  Coronary artery disease.  Heart failure.  Heart valve conditions.  High blood pressure.  Inflammation of the sac that surrounds the heart (pericarditis).  Heart surgery.  Certain heart rhythm disorders, such as Wolf-Parkinson-White syndrome.  Other causes include:  Pneumonia.  Obstructive sleep apnea.  Blockage of an artery in the lungs (pulmonary embolism, or PE).  Lung cancer.  Chronic lung disease.  Thyroid problems, especially if the thyroid is overactive (hyperthyroidism).  Caffeine.  Excessive alcohol use or illegal drug use.  Use of some medicines, including certain decongestants and diet pills.  Sometimes, the cause cannot be found. What increases the risk? This condition is more likely to develop in:  People who are older in age.  People who smoke.  People who have diabetes mellitus.  People who are overweight  (obese).  Athletes who exercise vigorously.  What are the signs or symptoms? Symptoms of this condition include:  A feeling that your heart is beating rapidly or irregularly.  A feeling of discomfort or pain in your chest.  Shortness of breath.  Sudden light-headedness or weakness.  Getting tired easily during exercise.  In some cases, there are no symptoms. How is this diagnosed? Your health care provider may be able to detect atrial fibrillation when taking your pulse. If detected, this condition may be diagnosed with:  An electrocardiogram (ECG).  A Holter monitor test that records your heartbeat patterns over a 24-hour period.  Transthoracic echocardiogram (TTE) to evaluate how blood flows through your heart.  Transesophageal echocardiogram (TEE) to view more detailed images of your heart.  A stress test.  Imaging tests, such as a CT scan or chest X-ray.  Blood tests.  How is this treated? The main goals of treatment are to prevent blood clots from forming and to keep your heart beating at a normal rate and rhythm. The type of treatment that you receive depends on many factors, such as your underlying medical conditions and how you feel when you are experiencing atrial fibrillation. This condition may be treated with:  Medicine to slow down the heart rate, bring the hearts rhythm back to normal, or prevent clots from forming.  Electrical cardioversion. This is a procedure that resets your hearts rhythm by delivering a controlled, low-energy shock to the heart through your skin.  Different types of ablation, such as catheter ablation, catheter ablation with pacemaker, or surgical ablation. These procedures destroy the heart tissues that send abnormal signals. When the pacemaker is used, it is placed under your skin to help your heart beat in  a regular rhythm.  Follow these instructions at home:  Take over-the counter and prescription medicines only as told by your  health care provider.  If your health care provider prescribed a blood-thinning medicine (anticoagulant), take it exactly as told. Taking too much blood-thinning medicine can cause bleeding. If you do not take enough blood-thinning medicine, you will not have the protection that you need against stroke and other problems.  Do not use tobacco products, including cigarettes, chewing tobacco, and e-cigarettes. If you need help quitting, ask your health care provider.  If you have obstructive sleep apnea, manage your condition as told by your health care provider.  Do not drink alcohol.  Do not drink beverages that contain caffeine, such as coffee, soda, and tea.  Maintain a healthy weight. Do not use diet pills unless your health care provider approves. Diet pills may make heart problems worse.  Follow diet instructions as told by your health care provider.  Exercise regularly as told by your health care provider.  Keep all follow-up visits as told by your health care provider. This is important. How is this prevented?  Avoid drinking beverages that contain caffeine or alcohol.  Avoid certain medicines, especially medicines that are used for breathing problems.  Avoid certain herbs and herbal medicines, such as those that contain ephedra or ginseng.  Do not use illegal drugs, such as cocaine and amphetamines.  Do not smoke.  Manage your high blood pressure. Contact a health care provider if:  You notice a change in the rate, rhythm, or strength of your heartbeat.  You are taking an anticoagulant and you notice increased bruising.  You tire more easily when you exercise or exert yourself. Get help right away if:  You have chest pain, abdominal pain, sweating, or weakness.  You feel nauseous.  You notice blood in your vomit, bowel movement, or urine.  You have shortness of breath.  You suddenly have swollen feet and ankles.  You feel dizzy.  You have sudden weakness or  numbness of the face, arm, or leg, especially on one side of the body.  You have trouble speaking, trouble understanding, or both (aphasia).  Your face or your eyelid droops on one side. These symptoms may represent a serious problem that is an emergency. Do not wait to see if the symptoms will go away. Get medical help right away. Call your local emergency services (911 in the [place] Do not drive yourself to the hospital. This information is not intended to replace advice given to you by your health care provider. Make sure you discuss any questions you have with your health care provider. Document Released: 08/12/2005 Document Revised: 12/20/2015 Document Reviewed: 12/07/2014 Elsevier Interactive Patient Education  2017 [employer]. Information on my medicine - ELIQUIS (apixaban)  This medication education was reviewed with me or my healthcare representative as part of my discharge preparation.  The pharmacist that spoke with me during my hospital stay was:  Clarnce Flock, [facility]  Why was Eliquis prescribed for you? Eliquis was prescribed for you to reduce the risk of a blood clot forming that can cause a stroke if you have a medical condition called atrial fibrillation (a type of irregular heartbeat).  What do You need to know about Eliquis ? Take your Eliquis TWICE DAILY - one tablet in the morning and one tablet in the evening with or without food. If you have difficulty swallowing the tablet whole please discuss with your pharmacist how to take the medication  safely.  Take Eliquis exactly as prescribed by your doctor and DO NOT stop taking Eliquis without talking to the doctor who prescribed the medication.  Stopping may increase your risk of developing a stroke.  Refill your prescription before you run out.  After discharge, you should have regular check-up appointments with your healthcare provider that is prescribing your Eliquis.  In the future your dose may need to be  changed if your kidney function or weight changes by a significant amount or as you get older.  What do you do if you miss a dose? If you miss a dose, take it as soon as you remember on the same day and resume taking twice daily.  Do not take more than one dose of ELIQUIS at the same time to make up a missed dose.  Important Safety Information A possible side effect of Eliquis is bleeding. You should call your healthcare provider right away if you experience any of the following: ? Bleeding from an injury or your nose that does not stop. ? Unusual colored urine (red or dark brown) or unusual colored stools (red or black). ? Unusual bruising for unknown reasons. ? A serious fall or if you hit your head (even if there is no bleeding).  Some medicines may interact with Eliquis and might increase your risk of bleeding or clotting while on Eliquis. To help avoid this, consult your healthcare provider or pharmacist prior to using any new prescription or non-prescription medications, including herbals, vitamins, non-steroidal anti-inflammatory drugs (NSAIDs) and supplements.  This website has more information on Eliquis (apixaban): http://www.eliquis.com/eliquis/home

## 2017-06-14 NOTE — Progress Notes (Signed)
Paged Dr Algie CofferKadakia and informed him of pt's heart rate while ambulating

## 2017-06-14 NOTE — Progress Notes (Signed)
Ref: Jaclyn Shaggy, MD   Subjective:  Increased heart rate with ambulation. No chest pain.  Objective:  Vital Signs in the last 24 hours: Temp:  [98 F (36.7 C)-98.5 F (36.9 C)] 98 F (36.7 C) (10/20 1226) Pulse Rate:  [85-117] 117 (10/20 1226) Cardiac Rhythm: Atrial fibrillation (10/20 1226) Resp:  [13-26] 26 (10/20 1226) BP: (93-131)/(72-98) 114/82 (10/20 1226) SpO2:  [94 %-100 %] 98 % (10/20 1226)  Physical Exam: BP Readings from Last 1 Encounters:  06/14/17 114/82    Wt Readings from Last 1 Encounters:  06/11/17 80.6 kg (177 lb 11.1 oz)    Weight change:  Body mass index is 26.24 kg/m. HEENT: [place]/AT, Eyes-PERL, EOMI, Conjunctiva-Pink, Sclera-Non-icteric Neck: No JVD, No bruit, Trachea midline. Lungs:  Clear, Bilateral. Cardiac:  Irregular rhythm, normal S1 and S2, no S3. II/VI systolic murmur. Abdomen:  Soft, non-tender. BS present. Extremities:  No edema present. No cyanosis. No clubbing. CNS: AxOx3, Cranial nerves grossly intact, moves all 4 extremities.  Skin: Warm and dry.   Intake/Output from previous day: 10/19 0701 - 10/20 0700 In: 1177 [P.O.:480; I.V.:697] Out: -     Lab Results: BMET    Component Value Date/Time   NA 138 06/14/2017 0923   NA 138 06/13/2017 1024   NA 135 06/12/2017 0418   NA 143 02/10/2017 0908   K 4.2 06/14/2017 0923   K 3.2 (L) 06/13/2017 1024   K 3.2 (L) 06/12/2017 0418   CL 100 (L) 06/14/2017 0923   CL 102 06/13/2017 1024   CL 98 (L) 06/12/2017 0418   CO2 29 06/14/2017 0923   CO2 28 06/13/2017 1024   CO2 24 06/12/2017 0418   GLUCOSE 99 06/14/2017 0923   GLUCOSE 101 (H) 06/13/2017 1024   GLUCOSE 104 (H) 06/12/2017 0418   BUN 12 06/14/2017 0923   BUN 13 06/13/2017 1024   BUN 13 06/12/2017 0418   BUN 12 02/10/2017 0908   CREATININE 0.89 06/14/2017 0923   CREATININE 0.90 06/13/2017 1024   CREATININE 1.10 (H) 06/12/2017 0418   CREATININE 0.99 07/04/2016 0904   CREATININE 1.27 (H) 07/13/2015 1758   CREATININE 1.01  01/05/2015 1026   CALCIUM 9.2 06/14/2017 0923   CALCIUM 8.9 06/13/2017 1024   CALCIUM 9.1 06/12/2017 0418   GFRNONAA >60 06/14/2017 0923   GFRNONAA >60 06/13/2017 1024   GFRNONAA 53 (L) 06/12/2017 0418   GFRNONAA 62 07/04/2016 0904   GFRNONAA 61 01/05/2015 1026   GFRNONAA 87 10/03/2014 1455   GFRAA >60 06/14/2017 0923   GFRAA >60 06/13/2017 1024   GFRAA >60 06/12/2017 0418   GFRAA 72 07/04/2016 0904   GFRAA 70 01/05/2015 1026   GFRAA >89 10/03/2014 1455   CBC    Component Value Date/Time   WBC 4.6 06/14/2017 0219   RBC 4.61 06/14/2017 0219   HGB 14.3 06/14/2017 0219   HCT 41.9 06/14/2017 0219   PLT 139 (L) 06/14/2017 0219   MCV 90.9 06/14/2017 0219   MCH 31.0 06/14/2017 0219   MCHC 34.1 06/14/2017 0219   RDW 12.8 06/14/2017 0219   LYMPHSABS 2.6 06/11/2017 2302   MONOABS 0.5 06/11/2017 2302   EOSABS 0.2 06/11/2017 2302   BASOSABS 0.0 06/11/2017 2302   HEPATIC Function Panel  Recent Labs  07/04/16 0904 02/10/17 0908 06/11/17 2302  PROT 6.3 5.9* 6.5   HEMOGLOBIN A1C No components found for: HGA1C,  MPG CARDIAC ENZYMES Lab Results  Component Value Date   TROPONINI <0.03 06/12/2017   TROPONINI <0.03 06/12/2017  TROPONINI <0.03 06/11/2017   BNP No results for input(s): PROBNP in the last 8760 hours. TSH  Recent Labs  02/10/17 0908 06/11/17 1635 06/11/17 2302  TSH 12.750* 2.124 5.232*   CHOLESTEROL  Recent Labs  07/04/16 0904 02/10/17 0908 06/12/17 0418  CHOL 228* 192 253*    Scheduled Meds: . amiodarone  200 mg Oral BID  . apixaban  5 mg Oral BID  . aspirin EC  81 mg Oral Daily  . atorvastatin  40 mg Oral QHS  . diltiazem  30 mg Oral Q6H  . Influenza vac split quadrivalent PF  0.5 mL Intramuscular Tomorrow-1000  . levothyroxine  100 mcg Oral QAC breakfast  . metoprolol tartrate  50 mg Oral BID  . sertraline  100 mg Oral Daily   Continuous Infusions: PRN Meds:.acetaminophen, ALPRAZolam, nitroGLYCERIN, ondansetron (ZOFRAN)  IV  Assessment/Plan: Atrial fibrillation with moderate ventricular response Hypertension Hypokalemia-corrected Hypothyroidism H/O bronchial asthma Gouty arthritis Tobacco use disorder Alcohol use disorder Hyperlipidemia Bronchial asthma  Add small dose diltiazem for rate control   LOS: 2 days    Orpah CobbAjay Rhylie Stehr  MD  06/14/2017, 12:29 PM

## 2017-06-14 NOTE — Progress Notes (Signed)
Ambulated pt around the unit on room air without any assistive devices. Heart rate increased to 126 a-fib. Pt tolerated well. Paged Dr Sharyn LullHarwani to update him on pt's heart rate while ambulating

## 2017-06-15 LAB — CBC
HEMATOCRIT: 40.9 % (ref 36.0–46.0)
Hemoglobin: 13.7 g/dL (ref 12.0–15.0)
MCH: 30.6 pg (ref 26.0–34.0)
MCHC: 33.5 g/dL (ref 30.0–36.0)
MCV: 91.5 fL (ref 78.0–100.0)
Platelets: 143 10*3/uL — ABNORMAL LOW (ref 150–400)
RBC: 4.47 MIL/uL (ref 3.87–5.11)
RDW: 12.9 % (ref 11.5–15.5)
WBC: 4.7 10*3/uL (ref 4.0–10.5)

## 2017-06-15 MED ORDER — DILTIAZEM HCL ER COATED BEADS 240 MG PO CP24
240.0000 mg | ORAL_CAPSULE | Freq: Every day | ORAL | Status: DC
  Administered 2017-06-15 – 2017-06-16 (×2): 240 mg via ORAL
  Filled 2017-06-15 (×3): qty 1

## 2017-06-15 NOTE — Progress Notes (Signed)
Ref: Anita Russell, [name], MD   Subjective:  Improving heart rate with diltiazem use. No chest pain.  Objective:  Vital Signs in the last 24 hours: Temp:  [97.7 F (36.5 C)-98.6 F (37 C)] 98.6 F (37 C) (10/21 0943) Pulse Rate:  [94-117] 95 (10/21 0943) Cardiac Rhythm: Atrial fibrillation (10/21 0943) Resp:  [15-26] 15 (10/21 0943) BP: (104-133)/(77-110) 106/77 (10/21 0943) SpO2:  [98 %-100 %] 100 % (10/21 0943)  Physical Exam: BP Readings from Last 1 Encounters:  06/15/17 106/77    Wt Readings from Last 1 Encounters:  06/11/17 80.6 kg (177 lb 11.1 oz)    Weight change:  Body mass index is 26.24 kg/m. HEENT: [place]/AT, Eyes-PERL, EOMI, Conjunctiva-Pink, Sclera-Non-icteric Neck: No JVD, No bruit, Trachea midline. Lungs:  Clear, Bilateral. Cardiac:  Irregular rhythm, normal S1 and S2, no S3. II/VI systolic murmur. Abdomen:  Soft, non-tender. BS present. Extremities:  No edema present. No cyanosis. No clubbing. CNS: AxOx3, Cranial nerves grossly intact, moves all 4 extremities.  Skin: Warm and dry.   Intake/Output from previous day: 10/20 0701 - 10/21 0700 In: 720 [P.O.:720] Out: -     Lab Results: BMET    Component Value Date/Time   NA 138 06/14/2017 0923   NA 138 06/13/2017 1024   NA 135 06/12/2017 0418   NA 143 02/10/2017 0908   K 4.2 06/14/2017 0923   K 3.2 (L) 06/13/2017 1024   K 3.2 (L) 06/12/2017 0418   CL 100 (L) 06/14/2017 0923   CL 102 06/13/2017 1024   CL 98 (L) 06/12/2017 0418   CO2 29 06/14/2017 0923   CO2 28 06/13/2017 1024   CO2 24 06/12/2017 0418   GLUCOSE 99 06/14/2017 0923   GLUCOSE 101 (H) 06/13/2017 1024   GLUCOSE 104 (H) 06/12/2017 0418   BUN 12 06/14/2017 0923   BUN 13 06/13/2017 1024   BUN 13 06/12/2017 0418   BUN 12 02/10/2017 0908   CREATININE 0.89 06/14/2017 0923   CREATININE 0.90 06/13/2017 1024   CREATININE 1.10 (H) 06/12/2017 0418   CREATININE 0.99 07/04/2016 0904   CREATININE 1.27 (H) 07/13/2015 1758   CREATININE 1.01 01/05/2015  1026   CALCIUM 9.2 06/14/2017 0923   CALCIUM 8.9 06/13/2017 1024   CALCIUM 9.1 06/12/2017 0418   GFRNONAA >60 06/14/2017 0923   GFRNONAA >60 06/13/2017 1024   GFRNONAA 53 (L) 06/12/2017 0418   GFRNONAA 62 07/04/2016 0904   GFRNONAA 61 01/05/2015 1026   GFRNONAA 87 10/03/2014 1455   GFRAA >60 06/14/2017 0923   GFRAA >60 06/13/2017 1024   GFRAA >60 06/12/2017 0418   GFRAA 72 07/04/2016 0904   GFRAA 70 01/05/2015 1026   GFRAA >89 10/03/2014 1455   CBC    Component Value Date/Time   WBC 4.7 06/15/2017 0308   RBC 4.47 06/15/2017 0308   HGB 13.7 06/15/2017 0308   HCT 40.9 06/15/2017 0308   PLT 143 (L) 06/15/2017 0308   MCV 91.5 06/15/2017 0308   MCH 30.6 06/15/2017 0308   MCHC 33.5 06/15/2017 0308   RDW 12.9 06/15/2017 0308   LYMPHSABS 2.6 06/11/2017 2302   MONOABS 0.5 06/11/2017 2302   EOSABS 0.2 06/11/2017 2302   BASOSABS 0.0 06/11/2017 2302   HEPATIC Function Panel  Recent Labs  07/04/16 0904 02/10/17 0908 06/11/17 2302  PROT 6.3 5.9* 6.5   HEMOGLOBIN A1C No components found for: HGA1C,  MPG CARDIAC ENZYMES Lab Results  Component Value Date   TROPONINI <0.03 06/12/2017   TROPONINI <0.03 06/12/2017  TROPONINI <0.03 06/11/2017   BNP No results for input(s): PROBNP in the last 8760 hours. TSH  Recent Labs  02/10/17 0908 06/11/17 1635 06/11/17 2302  TSH 12.750* 2.124 5.232*   CHOLESTEROL  Recent Labs  07/04/16 0904 02/10/17 0908 06/12/17 0418  CHOL 228* 192 253*    Scheduled Meds: . amiodarone  200 mg Oral BID  . apixaban  5 mg Oral BID  . aspirin EC  81 mg Oral Daily  . atorvastatin  40 mg Oral QHS  . diltiazem  240 mg Oral Daily  . levothyroxine  100 mcg Oral QAC breakfast  . metoprolol tartrate  50 mg Oral BID  . sertraline  100 mg Oral Daily   Continuous Infusions: PRN Meds:.acetaminophen, ALPRAZolam, nitroGLYCERIN, ondansetron (ZOFRAN) IV  Assessment/Plan: Atrial fibrillation with controlled ventricular  response Hypertension Hypokalemia-corrected Hypothyroidism History of bronchial asthma Gouty arthritis Tobacco use disorder Alcohol use disorder Hyperlipidemia Bronchial asthma  Increase diltiazem to 240 mg by mouth daily. TEE with cardioversion tomorrow.   LOS: 3 days    Orpah Cobb  MD  06/15/2017, 10:04 AM

## 2017-06-15 NOTE — Progress Notes (Signed)
Consent for procedure 10/22 is signed and in pt's chart

## 2017-06-16 ENCOUNTER — Encounter (HOSPITAL_COMMUNITY): Payer: Self-pay | Admitting: Certified Registered Nurse Anesthetist

## 2017-06-16 ENCOUNTER — Encounter (HOSPITAL_COMMUNITY)
Admission: EM | Disposition: A | Discharge status: Home / Self Care | Payer: Self-pay | Source: Home / Self Care | Attending: Cardiology

## 2017-06-16 ENCOUNTER — Encounter (HOSPITAL_COMMUNITY): Payer: Self-pay | Admitting: *Deleted

## 2017-06-16 ENCOUNTER — Other Ambulatory Visit: Payer: Self-pay

## 2017-06-16 ENCOUNTER — Inpatient Hospital Stay (HOSPITAL_COMMUNITY): Payer: Self-pay

## 2017-06-16 HISTORY — PX: TEE WITHOUT CARDIOVERSION: SHX5443

## 2017-06-16 HISTORY — PX: CARDIOVERSION: SHX1299

## 2017-06-16 LAB — CBC
HCT: 39.3 % (ref 36.0–46.0)
HEMOGLOBIN: 13 g/dL (ref 12.0–15.0)
MCH: 30.5 pg (ref 26.0–34.0)
MCHC: 33.1 g/dL (ref 30.0–36.0)
MCV: 92.3 fL (ref 78.0–100.0)
Platelets: 141 10*3/uL — ABNORMAL LOW (ref 150–400)
RBC: 4.26 MIL/uL (ref 3.87–5.11)
RDW: 12.8 % (ref 11.5–15.5)
WBC: 5.2 10*3/uL (ref 4.0–10.5)

## 2017-06-16 SURGERY — ECHOCARDIOGRAM, TRANSESOPHAGEAL
Anesthesia: Monitor Anesthesia Care

## 2017-06-16 MED ORDER — PROPOFOL 10 MG/ML IV BOLUS
INTRAVENOUS | Status: DC | PRN
  Administered 2017-06-16 (×3): 20 mg via INTRAVENOUS

## 2017-06-16 MED ORDER — LIDOCAINE 2% (20 MG/ML) 5 ML SYRINGE
INTRAMUSCULAR | Status: DC | PRN
  Administered 2017-06-16: 60 mg via INTRAVENOUS
  Administered 2017-06-16: 40 mg via INTRAVENOUS

## 2017-06-16 MED ORDER — PROPOFOL 500 MG/50ML IV EMUL
INTRAVENOUS | Status: DC | PRN
  Administered 2017-06-16: 100 ug/kg/min via INTRAVENOUS

## 2017-06-16 NOTE — CV Procedure (Signed)
PRE-OP DIAGNOSIS:  Atrial fibrillation.  POST-OP DIAGNOSIS:  Sinus rhythm post cardioversion.  OPERATOR:  Orpah CobbAjay Meryl Ponder, MD.     ANESTHESIA:  Propofol..  COMPLICATIONS:  None.   OPERATIVE TERM:  DC cardioversion.  The nature of the procedure, risks and alternatives were discussed with the patient who gave informed consent.  OPERATIVE TECHNIQUE:  The patient was sedated with Propofol.  When the patient was no longer responsive to quiet voice, DC cardioversion was performed with 120 J biphasically and synchronously.  successfully. The patient was then monitored until fully alert and left the procedure area in stable condition.  IMPRESSION:  Successful DC cardioversion.

## 2017-06-16 NOTE — Transfer of Care (Signed)
Immediate Anesthesia Transfer of Care Note  Patient: Anita Russell  Procedure(s) Performed: TRANSESOPHAGEAL ECHOCARDIOGRAM (TEE) (N/A ) CARDIOVERSION (N/A )  Patient Location: Endoscopy Unit  Anesthesia Type:General  Level of Consciousness: awake, alert  and oriented  Airway & Oxygen Therapy: Patient Spontanous Breathing  Post-op Assessment: Report given to RN and Post -op Vital signs reviewed and stable  Post vital signs: Reviewed and stable  Last Vitals:  Vitals:   06/16/17 1100 06/16/17 1331  BP: 114/79 110/77  Pulse: 80 79  Resp: 19 17  Temp: 36.7 C 37 C  SpO2: 99% 98%    Last Pain:  Vitals:   06/16/17 1331  TempSrc: Oral  PainSc:       Patients Stated Pain Goal: 0 (06/16/17 0853)  Complications: No apparent anesthesia complications

## 2017-06-16 NOTE — Interval H&P Note (Signed)
History and Physical Interval Note:  06/16/2017 2:11 PM  Anita Russell  has presented today for surgery, with the diagnosis of a fib  The various methods of treatment have been discussed with the patient and family. After consideration of risks, benefits and other options for treatment, the patient has consented to  Procedure(s): TRANSESOPHAGEAL ECHOCARDIOGRAM (TEE) (N/A) CARDIOVERSION (N/A) as a surgical intervention .  The patient's history has been reviewed, patient examined, no change in status, stable for surgery.  I have reviewed the patient's chart and labs.  Questions were answered to the patient's satisfaction.     [name]

## 2017-06-16 NOTE — CV Procedure (Signed)
INDICATIONS:   The patient is 61 year old female with recent atrial fibrillation with RVR.  PROCEDURE:  Informed consent was discussed including risks, benefits and alternatives for the procedure.  Risks include, but are not limited to, cough, sore throat, vomiting, nausea, somnolence, esophageal and stomach trauma or perforation, bleeding, low blood pressure, aspiration, pneumonia, infection, trauma to the teeth and death.    Patient was given sedation.  The oropharynx was anesthetized with topical lidocaine.  The transesophageal probe was inserted in the esophagus and stomach and multiple views were obtained.  Agitated saline was used after the transesophageal probe was removed from the body.  The patient was kept under observation until the patient left the procedure room.  The patient left the procedure room in stable condition.   COMPLICATIONS:  There were no immediate complications.  FINDINGS:  1. LEFT VENTRICLE: The left ventricle is normal in structure and function.  Wall motion is normal.  No thrombus or masses seen in the left ventricle.  2. RIGHT VENTRICLE:  The right ventricle is normal in structure and function without any thrombus or masses.    3. LEFT ATRIUM:  The left atrium is mildly dilated without any thrombus or masses.  4. LEFT ATRIAL APPENDAGE:  The left atrial appendage is free of any thrombus or masses.  5. RIGHT ATRIUM:  The right atrium is free of any thrombus or masses.    6. ATRIAL SEPTUM:  The atrial septum is normal without any ASD or PFO.  7. MITRAL VALVE:  The mitral valve is normal in structure and function with mild regurgitation, no masses, stenosis or vegetations.  8. TRICUSPID VALVE:  The tricuspid valve is normal in structure and function with mild regurgitation, no masses, stenosis or vegetations.  9. AORTIC VALVE:  The aortic valve is sclerotic in structure and normal function with trivial regurgitation, no masses, stenosis or  vegetations.  10. PULMONIC VALVE:  The pulmonic valve is normal in structure and function with trivial regurgitation, masses, stenosis or vegetations.  11. AORTIC ARCH, ASCENDING AND DESCENDING AORTA:  The aorta had mild atherosclerosis in the aneurysmal ascending aorta and normal descending aorta.  The aortic arch was normal.  12.  Superior Vena Cava : No thrombus or catheter.  13.  Pulmonary Veins: Visible.  14.  Pulmonary artery: visible and normal.   IMPRESSION:   1. Mild MR and Trace TR, AI and PI. 2. Aneurysmal ascending aorta. 3. No PFO by bubble study.  RECOMMENDATIONS:    External cardioversion to sinus rhythm.

## 2017-06-16 NOTE — Progress Notes (Signed)
Subjective:  Patient denies any chest pain or shortness of breath. Remains in atrial fibrillation with controlled ventricular response. Awaiting synchronized DC TEE assisted cardioversion.  Objective:  Vital Signs in the last 24 hours: Temp:  [97.6 F (36.4 C)-98.5 F (36.9 C)] 98 F (36.7 C) (10/22 1100) Pulse Rate:  [65-96] 80 (10/22 1100) Resp:  [14-19] 19 (10/22 1100) BP: (91-125)/(55-97) 114/79 (10/22 1100) SpO2:  [97 %-99 %] 99 % (10/22 1100)  Intake/Output from previous day: 10/21 0701 - 10/22 0700 In: 480 [P.O.:480] Out: -  Intake/Output from this shift: No intake/output data recorded.  Physical Exam: Neck: no adenopathy, no carotid bruit, no JVD and supple, symmetrical, trachea midline Lungs: clear to auscultation bilaterally Heart: irregularly irregular rhythm, S1, S2 normal and soft systolic murmur noted Abdomen: soft, non-tender; bowel sounds normal; no masses,  no organomegaly Extremities: extremities normal, atraumatic, no cyanosis or edema  Lab Results:  Recent Labs  06/15/17 0308 06/16/17 0322  WBC 4.7 5.2  HGB 13.7 13.0  PLT 143* 141*    Recent Labs  06/14/17 0923  NA 138  K 4.2  CL 100*  CO2 29  GLUCOSE 99  BUN 12  CREATININE 0.89   No results for input(s): TROPONINI in the last 72 hours.  Invalid input(s): CK, MB Hepatic Function Panel No results for input(s): PROT, ALBUMIN, AST, ALT, ALKPHOS, BILITOT, BILIDIR, IBILI in the last 72 hours. No results for input(s): CHOL in the last 72 hours. No results for input(s): PROTIME in the last 72 hours.  Imaging: Imaging results have been reviewed and No results found.  Cardiac Studies:  Assessment/Plan:  A. Fib flutter with moderate ventricle response Status postAtypical chest pain Hypertension hypokalemia Hyperlipidemia Hypothyroidism History of bronchial asthma Gouty arthritis Tobacco abuse EtOH abuse Depression Plan Continue present management Schedule for TEE assisted  synchronized DC cardioversion later today  LOS: 4 days    Anita Russell, Anita Russell 06/16/2017, 12:16 PM

## 2017-06-16 NOTE — Progress Notes (Signed)
Pt. Tolerating clear liquid diet. Asking for regular diet. MD paged. Approved advancing diet.

## 2017-06-16 NOTE — Anesthesia Procedure Notes (Signed)
Performed by: Samara DeistBECKNER, Alechia Lezama B Pre-anesthesia Checklist: Patient identified, Emergency Drugs available, Suction available and Patient being monitored Patient Re-evaluated:Patient Re-evaluated prior to induction Oxygen Delivery Method: Nasal cannula Preoxygenation: Pre-oxygenation with 100% oxygen Placement Confirmation: positive ETCO2 Dental Injury: Teeth and Oropharynx as per pre-operative assessment  Comments: Bite block placed

## 2017-06-16 NOTE — Anesthesia Preprocedure Evaluation (Signed)
Anesthesia Evaluation  Patient identified by MRN, date of birth, ID band Patient awake    Reviewed: Allergy & Precautions, NPO status , Patient's Chart, lab work & pertinent test results  Airway Mallampati: II  TM Distance: >3 FB Neck ROM: Full    Dental no notable dental hx.    Pulmonary asthma , Current Smoker,    Pulmonary exam normal breath sounds clear to auscultation       Cardiovascular hypertension, Pt. on medications Normal cardiovascular exam Rhythm:Regular Rate:Normal     Neuro/Psych PSYCHIATRIC DISORDERS Anxiety Depression negative neurological ROS     GI/Hepatic negative GI ROS, Neg liver ROS,   Endo/Other  Hypothyroidism   Renal/GU negative Renal ROS     Musculoskeletal  (+) Arthritis ,   Abdominal   Peds  Hematology negative hematology ROS (+)   Anesthesia Other Findings   Reproductive/Obstetrics negative OB ROS                            Anesthesia Physical Anesthesia Plan  ASA: III  Anesthesia Plan: MAC   Post-op Pain Management:    Induction: Intravenous  PONV Risk Score and Plan: 0 and Propofol infusion  Airway Management Planned:   Additional Equipment:   Intra-op Plan:   Post-operative Plan:   Informed Consent: I have reviewed the patients History and Physical, chart, labs and discussed the procedure including the risks, benefits and alternatives for the proposed anesthesia with the patient or authorized representative who has indicated his/her understanding and acceptance.   Dental advisory given  Plan Discussed with: CRNA  Anesthesia Plan Comments:         Anesthesia Quick Evaluation

## 2017-06-16 NOTE — Anesthesia Postprocedure Evaluation (Signed)
Anesthesia Post Note  Patient: Anita Russell  Procedure(s) Performed: TRANSESOPHAGEAL ECHOCARDIOGRAM (TEE) (N/A ) CARDIOVERSION (N/A )     Patient location during evaluation: PACU Anesthesia Type: MAC Level of consciousness: awake and alert Pain management: pain level controlled Vital Signs Assessment: post-procedure vital signs reviewed and stable Respiratory status: spontaneous breathing Cardiovascular status: stable Anesthetic complications: no    Last Vitals:  Vitals:   06/16/17 1505 06/16/17 1614  BP: 109/75 98/65  Pulse: 61 (!) 53  Resp: 16 18  Temp:  36.7 C  SpO2: 97% 98%    Last Pain:  Vitals:   06/16/17 1614  TempSrc: Oral  PainSc:                  Nolon Nations

## 2017-06-16 NOTE — Care Management Note (Addendum)
Case Management Note  Patient Details  Name: Anita Russell MRN: [id number] Date of Birth: 11/16/1955  Subjective/Objective:   CM following for progression and d/c planning.                  Action/Plan: 06/16/2017 Noted pt to receive Eliquis, inquire sent to pt insurance provider re cost to pt , await response.  Met with pt and pt confirmed that she has no insurance, PCP at [facility]. This was also confirmed by CMA  Searching for possible copay with questionable commercial insurance.  Will provide 30 day free card and as pt is active with [facility] that pharmacy will also assist this pt with ongoing medication needs.   Expected Discharge Date:                  Expected Discharge Plan:  Home/Self Care  In-House Referral:  NA  Discharge planning Services  CM Consult, Medication Assistance  Post Acute Care Choice:    Choice offered to:     DME Arranged:    DME Agency:     HH Arranged:    HH Agency:     Status of Service:  In process, will continue to follow  If discussed at Long Length of Stay Meetings, dates discussed:    Additional Comments:  Adron Bene, RN 06/16/2017, 10:04 AM

## 2017-06-16 NOTE — Progress Notes (Signed)
  Echocardiogram Echocardiogram Transesophageal has been performed.  Nolon RodBrown, Tony 06/16/2017, 4:00 PM

## 2017-06-16 NOTE — H&P (View-Only) (Signed)
Ref: Anita Russell, [name], MD   Subjective:  Improving heart rate with diltiazem use. No chest pain.  Objective:  Vital Signs in the last 24 hours: Temp:  [97.7 F (36.5 C)-98.6 F (37 C)] 98.6 F (37 C) (10/21 0943) Pulse Rate:  [94-117] 95 (10/21 0943) Cardiac Rhythm: Atrial fibrillation (10/21 0943) Resp:  [15-26] 15 (10/21 0943) BP: (104-133)/(77-110) 106/77 (10/21 0943) SpO2:  [98 %-100 %] 100 % (10/21 0943)  Physical Exam: BP Readings from Last 1 Encounters:  06/15/17 106/77    Wt Readings from Last 1 Encounters:  06/11/17 80.6 kg (177 lb 11.1 oz)    Weight change:  Body mass index is 26.24 kg/m. HEENT: [place]/AT, Eyes-PERL, EOMI, Conjunctiva-Pink, Sclera-Non-icteric Neck: No JVD, No bruit, Trachea midline. Lungs:  Clear, Bilateral. Cardiac:  Irregular rhythm, normal S1 and S2, no S3. II/VI systolic murmur. Abdomen:  Soft, non-tender. BS present. Extremities:  No edema present. No cyanosis. No clubbing. CNS: AxOx3, Cranial nerves grossly intact, moves all 4 extremities.  Skin: Warm and dry.   Intake/Output from previous day: 10/20 0701 - 10/21 0700 In: 720 [P.O.:720] Out: -     Lab Results: BMET    Component Value Date/Time   NA 138 06/14/2017 0923   NA 138 06/13/2017 1024   NA 135 06/12/2017 0418   NA 143 02/10/2017 0908   K 4.2 06/14/2017 0923   K 3.2 (L) 06/13/2017 1024   K 3.2 (L) 06/12/2017 0418   CL 100 (L) 06/14/2017 0923   CL 102 06/13/2017 1024   CL 98 (L) 06/12/2017 0418   CO2 29 06/14/2017 0923   CO2 28 06/13/2017 1024   CO2 24 06/12/2017 0418   GLUCOSE 99 06/14/2017 0923   GLUCOSE 101 (H) 06/13/2017 1024   GLUCOSE 104 (H) 06/12/2017 0418   BUN 12 06/14/2017 0923   BUN 13 06/13/2017 1024   BUN 13 06/12/2017 0418   BUN 12 02/10/2017 0908   CREATININE 0.89 06/14/2017 0923   CREATININE 0.90 06/13/2017 1024   CREATININE 1.10 (H) 06/12/2017 0418   CREATININE 0.99 07/04/2016 0904   CREATININE 1.27 (H) 07/13/2015 1758   CREATININE 1.01 01/05/2015  1026   CALCIUM 9.2 06/14/2017 0923   CALCIUM 8.9 06/13/2017 1024   CALCIUM 9.1 06/12/2017 0418   GFRNONAA >60 06/14/2017 0923   GFRNONAA >60 06/13/2017 1024   GFRNONAA 53 (L) 06/12/2017 0418   GFRNONAA 62 07/04/2016 0904   GFRNONAA 61 01/05/2015 1026   GFRNONAA 87 10/03/2014 1455   GFRAA >60 06/14/2017 0923   GFRAA >60 06/13/2017 1024   GFRAA >60 06/12/2017 0418   GFRAA 72 07/04/2016 0904   GFRAA 70 01/05/2015 1026   GFRAA >89 10/03/2014 1455   CBC    Component Value Date/Time   WBC 4.7 06/15/2017 0308   RBC 4.47 06/15/2017 0308   HGB 13.7 06/15/2017 0308   HCT 40.9 06/15/2017 0308   PLT 143 (L) 06/15/2017 0308   MCV 91.5 06/15/2017 0308   MCH 30.6 06/15/2017 0308   MCHC 33.5 06/15/2017 0308   RDW 12.9 06/15/2017 0308   LYMPHSABS 2.6 06/11/2017 2302   MONOABS 0.5 06/11/2017 2302   EOSABS 0.2 06/11/2017 2302   BASOSABS 0.0 06/11/2017 2302   HEPATIC Function Panel  Recent Labs  07/04/16 0904 02/10/17 0908 06/11/17 2302  PROT 6.3 5.9* 6.5   HEMOGLOBIN A1C No components found for: HGA1C,  MPG CARDIAC ENZYMES Lab Results  Component Value Date   TROPONINI <0.03 06/12/2017   TROPONINI <0.03 06/12/2017  TROPONINI <0.03 06/11/2017   BNP No results for input(s): PROBNP in the last 8760 hours. TSH  Recent Labs  02/10/17 0908 06/11/17 1635 06/11/17 2302  TSH 12.750* 2.124 5.232*   CHOLESTEROL  Recent Labs  07/04/16 0904 02/10/17 0908 06/12/17 0418  CHOL 228* 192 253*    Scheduled Meds: . amiodarone  200 mg Oral BID  . apixaban  5 mg Oral BID  . aspirin EC  81 mg Oral Daily  . atorvastatin  40 mg Oral QHS  . diltiazem  240 mg Oral Daily  . levothyroxine  100 mcg Oral QAC breakfast  . metoprolol tartrate  50 mg Oral BID  . sertraline  100 mg Oral Daily   Continuous Infusions: PRN Meds:.acetaminophen, ALPRAZolam, nitroGLYCERIN, ondansetron (ZOFRAN) IV  Assessment/Plan: Atrial fibrillation with controlled ventricular  response Hypertension Hypokalemia-corrected Hypothyroidism History of bronchial asthma Gouty arthritis Tobacco use disorder Alcohol use disorder Hyperlipidemia Bronchial asthma  Increase diltiazem to 240 mg by mouth daily. TEE with cardioversion tomorrow.   LOS: 3 days    Anita Cobb  MD  06/15/2017, 10:04 AM

## 2017-06-17 ENCOUNTER — Encounter (HOSPITAL_COMMUNITY): Payer: Self-pay | Admitting: Cardiovascular Disease

## 2017-06-17 LAB — CBC
HCT: 36.2 % (ref 36.0–46.0)
HEMOGLOBIN: 12 g/dL (ref 12.0–15.0)
MCH: 30.5 pg (ref 26.0–34.0)
MCHC: 33.1 g/dL (ref 30.0–36.0)
MCV: 92.1 fL (ref 78.0–100.0)
Platelets: 136 10*3/uL — ABNORMAL LOW (ref 150–400)
RBC: 3.93 MIL/uL (ref 3.87–5.11)
RDW: 12.7 % (ref 11.5–15.5)
WBC: 4.5 10*3/uL (ref 4.0–10.5)

## 2017-06-17 MED ORDER — AMIODARONE HCL 200 MG PO TABS: 200.0000 mg | ORAL_TABLET | Freq: Two times a day (BID) | ORAL | 3 refills | Status: DC

## 2017-06-17 MED ORDER — APIXABAN 5 MG PO TABS: 5.0000 mg | ORAL_TABLET | Freq: Two times a day (BID) | ORAL | 3 refills | Status: DC

## 2017-06-17 MED ORDER — LOSARTAN POTASSIUM 50 MG PO TABS: 50.0000 mg | ORAL_TABLET | Freq: Every day | ORAL | 11 refills | Status: DC

## 2017-06-17 MED FILL — LOSARTAN POTASSIUM 50 MG TA: 50 | 30 days supply | Qty: 30 | Fill #0

## 2017-06-17 MED FILL — !ELIQUIS 5MG TABLET: 5 | 30 days supply | Qty: 60 | Fill #0

## 2017-06-17 MED FILL — AMIODARONE HCL 200 MG TAB: 200 | 30 days supply | Qty: 60 | Fill #0

## 2017-06-17 NOTE — Care Management Note (Addendum)
Case Management Note Previous Note Completed by [facility]  Patient Details  Name: Anita Russell MRN: [id number] Date of Birth: [birth year]  Subjective/Objective:   CM following for progression and d/c planning.                  Action/Plan: 06/16/2017 Noted pt to receive Eliquis, inquire sent to pt insurance provider re cost to pt , await response.  Met with pt and pt confirmed that she has no insurance, PCP at [facility]. This was also confirmed by CMA  Searching for possible copay with questionable commercial insurance.  Will provide 30 day free card and as pt is active with [facility] that pharmacy will also assist this pt with ongoing medication needs.   Expected Discharge Date:  06/17/17               Expected Discharge Plan:  Home/Self Care  In-House Referral:  NA  Discharge planning Services  CM Consult, Medication Assistance  Post Acute Care Choice:    Choice offered to:     DME Arranged:    DME Agency:     HH Arranged:    HH Agency:     Status of Service:  In process, will continue to follow  If discussed at Long Length of Stay Meetings, dates discussed:    Additional Comments: 06/17/2017 {Pt to discharge home today - pt is independent from home.  Pt confirmed she is active with [facility] and utilizes clinic pharmacy for medications  CM provided Eliqius Card and confirmed that pts pharmacy of choice [facility] on [address] (CM unable to reach pharmacy at [facility] to confirm drug is available) has drug available for fill today.  Pt states she will contact [facility] once discharged for follow up appt .   [name], RN 06/17/2017, 11:21 AM

## 2017-06-17 NOTE — Discharge Summary (Signed)
Discharge summary dictated on 06/17/2017 dictation number is [id number]

## 2017-06-18 ENCOUNTER — Telehealth: Payer: Self-pay

## 2017-06-18 MED FILL — METOPROLOL TARTRATE 50 MG T: 50 | 30 days supply | Qty: 180 | Fill #1

## 2017-06-18 MED FILL — LEVOTHYROXINE 100 MCG TAB: 100 | 30 days supply | Qty: 30 | Fill #3

## 2017-06-18 NOTE — Discharge Summary (Signed)
NAMJuliene Russell:  WEISBORDT, Sophiah          ACCOUNT NO.:  [id number]  MEDICAL RECORD NO.:  [id number]  LOCATION:                                 FACILITY:  PHYSICIAN:  Christy Ehrsam N. Sharyn LullHarwani, M.D.      DATE OF BIRTH:  DATE OF ADMISSION:  06/11/2017 DATE OF DISCHARGE:  06/17/2017                              DISCHARGE SUMMARY   ADMITTING DIAGNOSES: 1. Supraventricular tachycardia/possible atrial flutter with 2:1     block. 2. Atypical chest pain. 3. Hypertension. 4. Hyperlipidemia. 5. Hypothyroidism. 6. History of bronchial asthma. 7. Gouty arthritis. 8. Tobacco abuse. 9. Ethyl alcohol abuse. 10.Depression. 11.Anxiety disorder.  FINAL DIAGNOSES: 1. Status post atrial fibrillation/flutter with rapid ventricular     response, status post TEE-assisted synchronized DC cardioversion. 2. Status post atypical chest pain, myocardial infarction ruled out. 3. Hypertension. 4. Hyperlipidemia. 5. Hypothyroidism. 6. History of bronchial asthma. 7. History of gouty arthritis. 8. Tobacco abuse. 9. Ethyl alcohol abuse. 10.Depression.  DISCHARGE HOME MEDICATIONS: 1. Amiodarone 200 mg 1 tablet twice daily for 1 week, which will be     switched to once daily after 1 week. 2. Eliquis 5 mg twice daily. 3. Losartan 50 mg 1 tablet daily. 4. Atorvastatin 20 mg daily. 5. Metoprolol tartrate 50 mg twice daily. 6. Hydrocortisone cream 2.5% apply locally as before. 7. Hydroxyzine 25 mg 3 times daily as before. 8. Levothyroxine 100 mcg 1 tablet daily as before. 9. Proventil inhaler as needed. 10.Zoloft 100 mg daily at night.  DIET:  Low-salt, low-cholesterol diet.  The patient has been advised to monitor blood pressure regularly and chart.  FOLLOWUP:  Follow up with me in 1 week.  Follow up with PMD as scheduled.  CONDITION AT DISCHARGE:  Stable.  BRIEF HISTORY AND HOSPITAL COURSE:  [name] is a 61 year old female with past medical history significant for hypertension,  hyperlipidemia, hypothyroidism, depression, anxiety disorder, history of bronchial asthma, history of gouty arthritis, tobacco abuse, alcohol abuse.  [facility] medication as she ran out for the last few days.  The patient complained of palpitation associated with feeling weak and dizzy for the last few days as she ran out of the medication.  The patient was noted to be in SVT.  EMS was called.  The patient received 6 mg of IV adenosine followed by 12 mg with transient conversion to atrial flutter with slow ventricular response and subsequently converted back to SVT.  In the ED, the patient received 50 mg of p.o. Lopressor and 10 mg of IV Cardizem bolus and was started on drip without significant improvement in her heart rate.  The patient received subsequently 5 mg of IV Lopressor x2 every 5 minutes with control of heart rate in 100s with irregularly irregular rhythm suggestive of atrial fibrillation.  The patient denies such episodes of palpitations in the past.  Denies any syncopal episode.  Denies any history of anginal chest pain.  PHYSICAL EXAMINATION:  GENERAL:  She was alert, awake, oriented x3. VITAL SIGNS:  Blood pressure was 119/97, pulse was 151 initially when seen in probable atrial flutter with 2:1 block. GENERAL:  She was alert, awake, oriented x3. HEENT:  Conjunctivae were pink. NECK:  Supple.  No JVD.  No bruit. LUNGS:  Clear to auscultation without rhonchi or rales. CARDIOVASCULAR:  She was tachycardic.  S1, S2 were soft. ABDOMEN:  Soft.  Bowel sounds are present.  Nontender. EXTREMITIES:  There was no clubbing, cyanosis, or edema. NEURO:  She was grossly intact.  LABORATORY DATA:  Sodium was 132, potassium 3.6, BUN was 12, and creatinine 1.13.  Hemoglobin was 16.4, hematocrit 48, white count of 6.5.  Troponin-I 2 sets were negative.  Her TSH was normal at 2.124.  IMAGING:  Her chest x-ray showed negative for edema or  consolidation. There was round opacity over the lower mediastinum, may reflect hernia.  BRIEF HISTORY AND HOSPITAL COURSE:  The patient was admitted to step- down unit, was started on IV Cardizem and p.o. Eliquis, and IV amiodarone.  The patient remained in atrial fibrillation.  The patient initially refused synchronized TEE-assisted DC cardioversion and wanted to be converted chemically if possible.  The patient remained in atrial fibrillation with moderate ventricular response.  Subsequently, the patient agreed for TEE-assisted cardioversion, which she successfully underwent yesterday and has remained in sinus rhythm.  The patient is ambulating in room without any problems.  She had occasional episode of bradycardia.  We will hold off the Cardizem and continue with metoprolol and amiodarone, the dose of which will be reduced as outpatient after 1 week if remains in sinus rhythm.  The patient has been advised to refrain from alcohol consumption and smoking.  The patient will be discharged home on above medications and will be followed up in my office in 1 week and her PMD as scheduled.     Eduardo Osier. Sharyn Lull, M.D.     MNH/MEDQ  D:  06/17/2017  T:  06/18/2017  Job:  [id number]

## 2017-06-18 NOTE — Telephone Encounter (Signed)
Met with the patient when she was in the clinic. She was picking up her metoprolol.  Reminded her of the 48 hour notice for refilling medications.  Hospital follow up appointment scheduled for 07/02/17 @ 1000.

## 2017-06-25 ENCOUNTER — Other Ambulatory Visit: Payer: Self-pay | Admitting: Cardiology

## 2017-06-25 DIAGNOSIS — R079 Chest pain, unspecified: Secondary | ICD-10-CM

## 2017-07-01 MED FILL — SERTRALINE HCL 100 MG TAB: 100 | 30 days supply | Qty: 30 | Fill #3

## 2017-07-01 MED FILL — hydrOXYzine HCL 25 MG TABS: 25 | 30 days supply | Qty: 90 | Fill #1

## 2017-07-01 MED FILL — !VENTOLIN HFA INHALER: 108 (90 BAS | 25 days supply | Qty: 18 | Fill #0

## 2017-07-02 ENCOUNTER — Ambulatory Visit: Payer: Self-pay | Attending: Internal Medicine | Admitting: Physician Assistant

## 2017-07-02 VITALS — BP 129/82 | HR 51 | Temp 97.8°F | Resp 18 | Ht 69.0 in | Wt 185.2 lb

## 2017-07-02 DIAGNOSIS — F1010 Alcohol abuse, uncomplicated: Secondary | ICD-10-CM | POA: Insufficient documentation

## 2017-07-02 DIAGNOSIS — E876 Hypokalemia: Secondary | ICD-10-CM | POA: Insufficient documentation

## 2017-07-02 DIAGNOSIS — E039 Hypothyroidism, unspecified: Secondary | ICD-10-CM | POA: Insufficient documentation

## 2017-07-02 DIAGNOSIS — J45909 Unspecified asthma, uncomplicated: Secondary | ICD-10-CM | POA: Insufficient documentation

## 2017-07-02 DIAGNOSIS — D696 Thrombocytopenia, unspecified: Secondary | ICD-10-CM | POA: Insufficient documentation

## 2017-07-02 DIAGNOSIS — Z79899 Other long term (current) drug therapy: Secondary | ICD-10-CM | POA: Insufficient documentation

## 2017-07-02 DIAGNOSIS — F419 Anxiety disorder, unspecified: Secondary | ICD-10-CM | POA: Insufficient documentation

## 2017-07-02 DIAGNOSIS — I4892 Unspecified atrial flutter: Secondary | ICD-10-CM | POA: Insufficient documentation

## 2017-07-02 DIAGNOSIS — E785 Hyperlipidemia, unspecified: Secondary | ICD-10-CM | POA: Insufficient documentation

## 2017-07-02 DIAGNOSIS — I471 Supraventricular tachycardia: Secondary | ICD-10-CM | POA: Insufficient documentation

## 2017-07-02 DIAGNOSIS — I4891 Unspecified atrial fibrillation: Secondary | ICD-10-CM | POA: Insufficient documentation

## 2017-07-02 DIAGNOSIS — F17200 Nicotine dependence, unspecified, uncomplicated: Secondary | ICD-10-CM | POA: Insufficient documentation

## 2017-07-02 DIAGNOSIS — Z7901 Long term (current) use of anticoagulants: Secondary | ICD-10-CM | POA: Insufficient documentation

## 2017-07-02 DIAGNOSIS — M109 Gout, unspecified: Secondary | ICD-10-CM | POA: Insufficient documentation

## 2017-07-02 DIAGNOSIS — F329 Major depressive disorder, single episode, unspecified: Secondary | ICD-10-CM | POA: Insufficient documentation

## 2017-07-02 DIAGNOSIS — R0789 Other chest pain: Secondary | ICD-10-CM | POA: Insufficient documentation

## 2017-07-02 DIAGNOSIS — I10 Essential (primary) hypertension: Secondary | ICD-10-CM | POA: Insufficient documentation

## 2017-07-02 MED ORDER — AMIODARONE HCL 200 MG PO TABS: 200.0000 mg | ORAL_TABLET | Freq: Every day | ORAL | 3 refills | Status: DC

## 2017-07-02 MED ORDER — FLUTICASONE PROPIONATE 50 MCG/ACT NA SUSP: 2.0000 | Freq: Every day | NASAL | 6 refills | Status: DC

## 2017-07-02 MED FILL — FLUTICASONE PROP 50 MCG SPR: 50 | 30 days supply | Qty: 16 | Fill #0

## 2017-07-02 NOTE — Progress Notes (Signed)
Patient ID: Anita BruinsMaryanne Lamorte, female   DOB: 07/14/1956, 61 y.o.   MRN: [id number]     Anita Russell, is a 61 y.o. female  ION:[id number]:[id number]  KGM:[id number]  DOB - 11/22/1955  Subjective:  Chief Complaint and HPI: Anita Russell is a 61 y.o. female here today to establish care and for a follow up visit After being hospitalized 10/17-10/23/2018 for : ADMITTING DIAGNOSES: 1. Supraventricular tachycardia/possible atrial flutter with 2:1     block. 2. Atypical chest pain. 3. Hypertension. 4. Hyperlipidemia. 5. Hypothyroidism. 6. History of bronchial asthma. 7. Gouty arthritis. 8. Tobacco abuse. 9. Ethyl alcohol abuse. 10.Depression. 11.Anxiety disorder.   FINAL DIAGNOSES: 1. Status post atrial fibrillation/flutter with rapid ventricular     response, status post TEE-assisted synchronized DC cardioversion. 2. Status post atypical chest pain, myocardial infarction ruled out. 3. Hypertension. 4. Hyperlipidemia. 5. Hypothyroidism. 6. History of bronchial asthma. 7. History of gouty arthritis. 8. Tobacco abuse. 9. Ethyl alcohol abuse. 10.Depression.  DISCHARGE HOME MEDICATIONS: 1. Amiodarone 200 mg 1 tablet twice daily for 1 week, which will be     switched to once daily after 1 week. 2. Eliquis 5 mg twice daily. 3. Losartan 50 mg 1 tablet daily. 4. Atorvastatin 20 mg daily. 5. Metoprolol tartrate 50 mg twice daily. 6. Hydrocortisone cream 2.5% apply locally as before. 7. Hydroxyzine 25 mg 3 times daily as before. 8. Levothyroxine 100 mcg 1 tablet daily as before. 9. Proventil inhaler as needed. 10.Zoloft 100 mg daily at night.   BRIEF HISTORY AND HOSPITAL COURSE:  Anita Russell is a 61 year old female with past medical history significant for hypertension, hyperlipidemia, hypothyroidism, depression, anxiety disorder, history of bronchial asthma, history of gouty arthritis, tobacco abuse, alcohol abuse.  [facility] to pick up her  medication as she ran out for the last few days.  The patient complained of palpitation associated with feeling weak and dizzy for the last few days as she ran out of the medication.  The patient was noted to be in SVT.  EMS was called.  The patient received 6 mg of IV adenosine followed by 12 mg with transient conversion to atrial flutter with slow ventricular response and subsequently converted back to SVT.  In the ED, the patient received 50 mg of p.o. Lopressor and 10 mg of IV Cardizem bolus and was started on drip without significant improvement in her heart rate.  The patient received subsequently 5 mg of IV Lopressor x2 every 5 minutes with control of heart rate in 100s with irregularly irregular rhythm suggestive of atrial fibrillation.  The patient denies such episodes of palpitations in the past.  Denies any syncopal episode.  Denies any history of anginal chest pain.   BRIEF HISTORY AND HOSPITAL COURSE:  The patient was admitted to step- down unit, was started on IV Cardizem and p.o. Eliquis, and IV amiodarone.  The patient remained in atrial fibrillation.  The patient initially refused synchronized TEE-assisted DC cardioversion and wanted to be converted chemically if possible.  The patient remained in atrial fibrillation with moderate ventricular response.  Subsequently, the patient agreed for TEE-assisted cardioversion, which she successfully underwent yesterday and has remained in sinus rhythm.  The patient is ambulating in room without any problems.  She had occasional episode of bradycardia.  We will hold off the Cardizem and continue with metoprolol and amiodarone, the dose of which will be reduced as outpatient after 1 week if remains in sinus rhythm.  The patient has been advised  to refrain from alcohol consumption and smoking.  The patient will be discharged home on above medications and will be followed up in my office in 1 week and her PMD as  scheduled.   06/25/2017 saw cardiology [facility])and was having SOB and dizziness.  She has been having dizziness since the summer.  Today, she is feeling overwhelmed with everything.  She has started using a nicotine patch to stop smoking.  She is concerned about her ability to work with all of her health problems.  She is tapering down on her alcohol use.  She denies any new complaints today.  She is considering applying for disability.  She just donated plasma to help pay her car insurance  ED/Hospital notes reviewed.    ROS:   Constitutional:  No f/c, No night sweats, No unexplained weight loss. EENT:  No vision changes, No blurry vision, No hearing changes. No mouth, throat, or ear problems.  Respiratory: No cough, No new SOB Cardiac: No CP, no palpitations GI:  No abd pain, No N/V/D. GU: No Urinary s/sx Musculoskeletal: No joint pain Neuro: No headache, no new dizziness, no motor weakness.  Skin: No rash Endocrine:  No polydipsia. No polyuria.  Psych: Denies SI/HI  No problems updated.  ALLERGIES: Allergies  Allergen Reactions  . Hyzaar [Losartan Potassium-Hctz] Other (See Comments)    Caused patient to feel light-headed and gave her vertigo  . Meloxicam Rash    A "very bad" rash    PAST MEDICAL HISTORY: Past Medical History:  Diagnosis Date  . Anxiety   . Asthma   . Depression   . Gout   . Hypertension   . Hypothyroidism   . Mental disorder   . Thyroid disease     MEDICATIONS AT HOME: Prior to Admission medications   Medication Sig Start Date End Date Taking? Authorizing Provider  amiodarone (PACERONE) 200 MG tablet Take 1 tablet (200 mg total) daily by mouth. 07/02/17  Yes McClung, Marzella SchleinAngela M, PA-C  apixaban (ELIQUIS) 5 MG TABS tablet Take 1 tablet (5 mg total) by mouth 2 (two) times daily. 06/17/17  Yes Rinaldo CloudHarwani, Mohan, MD  atorvastatin (LIPITOR) 20 MG tablet Take 1 tablet (20 mg total) by mouth daily. 02/04/17  Yes Jaclyn ShaggyAmao, [name], MD   hydrocortisone 2.5 % cream Apply topically 2 (two) times daily. Patient taking differently: Apply 1 application topically 2 (two) times daily. APPLY TO BOTH LEGS 03/11/17  Yes Jaclyn ShaggyAmao, [name], MD  hydrOXYzine (ATARAX/VISTARIL) 25 MG tablet Take 1 tablet (25 mg total) by mouth 3 (three) times daily as needed. Patient taking differently: Take 25 mg by mouth 3 (three) times daily as needed for anxiety.  02/04/17  Yes Jaclyn ShaggyAmao, [name], MD  levothyroxine (SYNTHROID, LEVOTHROID) 100 MCG tablet TAKE 1 TABLET BY MOUTH DAILY BEFORE BREAKFAST FOR THYROID DISEASE. Patient taking differently: Take 100 mcg by mouth daily before breakfast. FOR THYROID DISEASE 02/04/17  Yes Jaclyn ShaggyAmao, [name], MD  losartan (COZAAR) 50 MG tablet Take 1 tablet (50 mg total) by mouth daily. 06/17/17 06/17/18 Yes Rinaldo CloudHarwani, Mohan, MD  metoprolol tartrate (LOPRESSOR) 50 MG tablet Take 1 tablet (50 mg total) by mouth 2 (two) times daily. 02/04/17  Yes Jaclyn ShaggyAmao, [name], MD  PROVENTIL HFA 108 (90 Base) MCG/ACT inhaler INHALE 2 PUFFS INTO THE LUNGS EVERY 6 HOURS AS NEEDED. FOR SHORTNESS OF BREATH Patient taking differently: Inhale 2 puffs into the lungs every 6 hours as needed for shortness of breath 06/05/17  Yes Jaclyn ShaggyAmao, [name], MD  sertraline (ZOLOFT) 100  MG tablet TAKE 1 TABLET BY MOUTH ONCE DAILY FOR ANXIETY AND DEPRESSION Patient taking differently: Take 100 mg by mouth daily. FOR ANXIETY AND DEPRESSION 02/04/17  Yes Jaclyn Shaggy, MD  fluticasone (FLONASE) 50 MCG/ACT nasal spray Place 2 sprays daily into both nostrils. 07/02/17   Anders Simmonds, PA-C     Objective:  EXAM:   Vitals:   07/02/17 1001  BP: 129/82  Pulse: (!) 51  Resp: 18  Temp: 97.8 F (36.6 C)  TempSrc: Oral  SpO2: 99%  Weight: 185 lb 3.2 oz (84 kg)  Height: 5\' 9"  (1.753 m)    General appearance : A&OX3. NAD. Non-toxic-appearing HEENT: Atraumatic and Normocephalic.  PERRLA. EOM intact.   Neck: supple, no JVD. No cervical lymphadenopathy. No thyromegaly Chest/Lungs:   Breathing-non-labored, Good air entry bilaterally, breath sounds normal without rales, rhonchi, or wheezing  CVS: S1 S2 regular, no murmurs, gallops, rubs  Extremities: Bilateral Lower Ext shows no edema, both legs are warm to touch with = pulse throughout Neurology:  CN II-XII grossly intact, Non focal.   Psych:  TP linear. J/I WNL. Normal speech. Appropriate eye contact and affect.  Skin:  No Rash  Data Review Lab Results  Component Value Date   HGBA1C 5.0 01/05/2015     Assessment & Plan   1. SVT (supraventricular tachycardia) (HCC) Keep f/up with cardiology.  Amiodarone down to once daily  2. Hypertension, unspecified type Controlled, continue current regimen  3. Acquired hypothyroidism Continue current regimen  4. Alcohol abuse I have counseled the patient at length about substance abuse and addiction.  12 step meetings/recovery recommended.  Local 12 step meeting lists were given and attendance was encouraged.  Patient expresses understanding. Gave AA website and have encouraged her to go   5. Anxiety and depression Continue current regimen  6. Hypokalemia Avoid alcohol - Basic metabolic panel  7. Hypomagnesemia Avoid alcohol - Magnesium  8. Thrombocytopenia (HCC) Avoid alcohol/cigarettes - CBC with Differential/Platelet  9. Smoking 1-800-quitnow.  She is currently wearing a patch and is working on quitting  She can try flonase which may help with dizziness.  She really needs to continue with smoking and alcohol cessation to improve her overall health.  I believe these are huge contributors to many of the problems she is having.  Sobriety and smoking cessation would be huge in improving her health.  I have also cautioned her about donating plasma as this is an additional risk to her healht while jer body is trying to mend and repair itself.       Patient have been counseled extensively about nutrition and exercise  Return in about 1 month (around 08/01/2017)  for Dr Venetia Night; f/up htn and multiple issues.  The patient was given clear instructions to go to ER or return to medical center if symptoms don't improve, worsen or new problems develop. The patient verbalized understanding. The patient was told to call to get lab results if they haven't heard anything in the next week.     Georgian Co, PA-C [facility] [place], [place] [phone]   07/02/2017, 10:40 AM

## 2017-07-02 NOTE — Patient Instructions (Addendum)
[email or web address] for AA meetings  1-800-quit now for help with smoking

## 2017-07-03 LAB — CBC WITH DIFFERENTIAL/PLATELET
BASOS: 1 %
Basophils Absolute: 0 10*3/uL (ref 0.0–0.2)
EOS (ABSOLUTE): 0.3 10*3/uL (ref 0.0–0.4)
Eos: 4 %
Hematocrit: 38.7 % (ref 34.0–46.6)
Hemoglobin: 12.8 g/dL (ref 11.1–15.9)
IMMATURE GRANS (ABS): 0 10*3/uL (ref 0.0–0.1)
Immature Granulocytes: 0 %
LYMPHS ABS: 1.5 10*3/uL (ref 0.7–3.1)
LYMPHS: 25 %
MCH: 30 pg (ref 26.6–33.0)
MCHC: 33.1 g/dL (ref 31.5–35.7)
MCV: 91 fL (ref 79–97)
Monocytes Absolute: 0.5 10*3/uL (ref 0.1–0.9)
Monocytes: 8 %
NEUTROS ABS: 3.6 10*3/uL (ref 1.4–7.0)
Neutrophils: 62 %
PLATELETS: 236 10*3/uL (ref 150–379)
RBC: 4.26 x10E6/uL (ref 3.77–5.28)
RDW: 13.8 % (ref 12.3–15.4)
WBC: 5.8 10*3/uL (ref 3.4–10.8)

## 2017-07-03 LAB — BASIC METABOLIC PANEL
BUN / CREAT RATIO: 15 (ref 12–28)
BUN: 12 mg/dL (ref 8–27)
CHLORIDE: 100 mmol/L (ref 96–106)
CO2: 27 mmol/L (ref 20–29)
Calcium: 9.3 mg/dL (ref 8.7–10.3)
Creatinine, Ser: 0.78 mg/dL (ref 0.57–1.00)
GFR calc non Af Amer: 82 mL/min/{1.73_m2} (ref >59)
GFR, EST AFRICAN AMERICAN: 95 mL/min/{1.73_m2} (ref >59)
GLUCOSE: 87 mg/dL (ref 65–99)
POTASSIUM: 4.7 mmol/L (ref 3.5–5.2)
SODIUM: 139 mmol/L (ref 134–144)

## 2017-07-03 LAB — MAGNESIUM: MAGNESIUM: 1.9 mg/dL (ref 1.6–2.3)

## 2017-07-08 ENCOUNTER — Telehealth (INDEPENDENT_AMBULATORY_CARE_PROVIDER_SITE_OTHER): Payer: Self-pay | Admitting: *Deleted

## 2017-07-08 NOTE — Telephone Encounter (Signed)
-----   Message from Anders SimmondsAngela M McClung, [place]PA-C sent at 07/03/2017  9:01 AM EST ----- Please call patient.  All of her labs look much better.  Her potassium and magnesium are back to normal. Continue to avoid cigarettes and alcohol and follow-up as planned. Thanks, Georgian CoAngela McClung, PA-C

## 2017-07-08 NOTE — Telephone Encounter (Signed)
Medical Assistant left message on patient's home and cell voicemail. Voicemail states to give [place] [facility] at [phone]. Patient is aware of potassium and magnesium returning to normal. Patient is advised to avoid alcohol and cigarettes and follow up as planned.

## 2017-07-10 ENCOUNTER — Other Ambulatory Visit: Payer: Self-pay

## 2017-07-10 MED ORDER — APIXABAN 5 MG PO TABS: 5.0000 mg | ORAL_TABLET | Freq: Two times a day (BID) | ORAL | 3 refills | Status: DC

## 2017-07-16 ENCOUNTER — Ambulatory Visit (HOSPITAL_COMMUNITY)
Admission: RE | Admit: 2017-07-16 | Discharge: 2017-07-16 | Disposition: A | Discharge status: Home / Self Care | Payer: No Typology Code available for payment source | Source: Ambulatory Visit | Attending: Cardiology | Admitting: Cardiology

## 2017-07-16 DIAGNOSIS — R079 Chest pain, unspecified: Secondary | ICD-10-CM | POA: Insufficient documentation

## 2017-07-16 MED ORDER — REGADENOSON 0.4 MG/5ML IV SOLN
INTRAVENOUS | Status: AC
  Administered 2017-07-16: 0.4 mg via INTRAVENOUS
  Filled 2017-07-16: qty 5

## 2017-07-16 MED ORDER — TECHNETIUM TC 99M TETROFOSMIN IV KIT: 30.0000 | PACK | Freq: Once | INTRAVENOUS | Status: DC | PRN

## 2017-07-16 MED ORDER — TECHNETIUM TC 99M TETROFOSMIN IV KIT
10.0000 | PACK | Freq: Once | INTRAVENOUS | Status: AC | PRN
  Administered 2017-07-16: 10 via INTRAVENOUS

## 2017-07-16 MED ORDER — REGADENOSON 0.4 MG/5ML IV SOLN
0.4000 mg | Freq: Once | INTRAVENOUS | Status: AC
  Administered 2017-07-16: 0.4 mg via INTRAVENOUS

## 2017-07-21 MED FILL — LOSARTAN POTASSIUM 50 MG TA: 50 | 30 days supply | Qty: 30 | Fill #1

## 2017-07-21 MED FILL — !ELIQUIS 5MG TABLET: 5 | 30 days supply | Qty: 60 | Fill #1

## 2017-07-21 MED FILL — LEVOTHYROXINE 100 MCG TAB: 100 | 30 days supply | Qty: 30 | Fill #4

## 2017-07-28 MED FILL — SERTRALINE HCL 100 MG TAB: 100 | 30 days supply | Qty: 30 | Fill #4

## 2017-07-28 MED FILL — hydrOXYzine HCL 25 MG TABS: 25 | 30 days supply | Qty: 90 | Fill #2

## 2017-08-05 ENCOUNTER — Ambulatory Visit: Payer: Self-pay | Admitting: Family Medicine

## 2017-08-08 MED FILL — AMIODARONE HCL 200 MG TAB: 200 | 30 days supply | Qty: 60 | Fill #1

## 2017-08-14 ENCOUNTER — Ambulatory Visit: Payer: Self-pay | Attending: Family Medicine | Admitting: Family Medicine

## 2017-08-14 ENCOUNTER — Encounter: Payer: Self-pay | Admitting: Family Medicine

## 2017-08-14 VITALS — BP 117/71 | HR 56 | Temp 98.5°F | Ht 69.0 in | Wt 182.8 lb

## 2017-08-14 DIAGNOSIS — E039 Hypothyroidism, unspecified: Secondary | ICD-10-CM

## 2017-08-14 DIAGNOSIS — Z79899 Other long term (current) drug therapy: Secondary | ICD-10-CM | POA: Insufficient documentation

## 2017-08-14 DIAGNOSIS — J45909 Unspecified asthma, uncomplicated: Secondary | ICD-10-CM | POA: Insufficient documentation

## 2017-08-14 DIAGNOSIS — M109 Gout, unspecified: Secondary | ICD-10-CM | POA: Insufficient documentation

## 2017-08-14 DIAGNOSIS — Z90710 Acquired absence of both cervix and uterus: Secondary | ICD-10-CM | POA: Insufficient documentation

## 2017-08-14 DIAGNOSIS — Z9889 Other specified postprocedural states: Secondary | ICD-10-CM | POA: Insufficient documentation

## 2017-08-14 DIAGNOSIS — R0602 Shortness of breath: Secondary | ICD-10-CM

## 2017-08-14 DIAGNOSIS — I471 Supraventricular tachycardia, unspecified: Secondary | ICD-10-CM

## 2017-08-14 DIAGNOSIS — Z888 Allergy status to other drugs, medicaments and biological substances status: Secondary | ICD-10-CM | POA: Insufficient documentation

## 2017-08-14 DIAGNOSIS — I10 Essential (primary) hypertension: Secondary | ICD-10-CM

## 2017-08-14 DIAGNOSIS — Z7901 Long term (current) use of anticoagulants: Secondary | ICD-10-CM | POA: Insufficient documentation

## 2017-08-14 DIAGNOSIS — F419 Anxiety disorder, unspecified: Secondary | ICD-10-CM

## 2017-08-14 DIAGNOSIS — F329 Major depressive disorder, single episode, unspecified: Secondary | ICD-10-CM | POA: Insufficient documentation

## 2017-08-14 DIAGNOSIS — F17200 Nicotine dependence, unspecified, uncomplicated: Secondary | ICD-10-CM | POA: Insufficient documentation

## 2017-08-14 DIAGNOSIS — E7800 Pure hypercholesterolemia, unspecified: Secondary | ICD-10-CM

## 2017-08-14 DIAGNOSIS — IMO0001 Reserved for inherently not codable concepts without codable children: Secondary | ICD-10-CM

## 2017-08-14 MED ORDER — HYDROXYZINE HCL 25 MG PO TABS
25.0000 mg | ORAL_TABLET | Freq: Three times a day (TID) | ORAL | 3 refills | Status: DC | PRN
Start: 2017-08-14 — End: 2017-11-14

## 2017-08-14 MED ORDER — SERTRALINE HCL 100 MG PO TABS: ORAL_TABLET | ORAL | 6 refills | Status: DC

## 2017-08-14 MED ORDER — NICOTINE 14 MG/24HR TD PT24: 14.0000 mg | MEDICATED_PATCH | Freq: Every day | TRANSDERMAL | 1 refills | Status: DC

## 2017-08-14 MED FILL — hydrOXYzine HCL 25 MG TABS: 25 | 30 days supply | Qty: 90 | Fill #0

## 2017-08-14 MED FILL — SERTRALINE HCL 100 MG TAB: 100 | 30 days supply | Qty: 30 | Fill #0

## 2017-08-14 MED FILL — NICOTINE 14 MG/24HR PATCH: 14 | 28 days supply | Qty: 28 | Fill #0

## 2017-08-14 NOTE — Patient Instructions (Signed)
Pasos para dejar de fumar (Steps to Quit Smoking) Fumar tabaco es malo para su salud. Puede afectar a casi cualquier rgano del cuerpo. Fumar lo pone a usted y a las personas a su alrededor en riesgo de contraer muchas enfermedades graves de largo plazo (crnicas). Dejar de fumar es difcil, pero es una de las mejores cosas que puede hacer por su salud. Nunca es muy tarde para dejar de fumar. CULES SON LOS BENEFICIOS QUE SE OBTIENEN AL DEJAR DE FUMAR? Al dejar de fumar, se reduce el riesgo de contraer enfermedades y afecciones graves. Estas pueden incluir los siguientes:  Enfermedad o cncer de pulmn.  Cardiopata coronaria.  Ictus.  Infarto de miocardio.  Imposibilidad de tener hijos (infertilidad).  Huesos dbiles (osteoporosis) y huesos rotos (fracturas). La tos, las sibilancias y la falta de aire son sntomas que mejoran cuando deja de fumar. Es posible tambin que se enferme con menor frecuencia. Si est embarazada, dejar de fumar la ayudar a reducir las probabilidades de tener un beb de bajo peso al nacer. QU PUEDO HACER PARA ABANDONAR EL HBITO DE FUMAR? Pregntele al mdico sobre las cosas que pueden ayudarlo a dejar el hbito. Algunos cosas que puede hacer (estrategias) incluyen:  Dejar de fumar de forma definitiva en lugar de ir reduciendo gradualmente la cantidad de cigarrillos durante un perodo.  Recibir asesoramiento psicolgico individual. Es ms probable que tenga xito si asiste a diversas sesiones de asesoramiento psicolgico.  Usar recursos y sistemas de soporte, como, por ejemplo: ? Charlas en lnea con un consejero. ? Lneas telefnicas para dejar de fumar. ? Materiales impresos de autoayuda. ? Grupos de apoyo o asesoramiento psicolgico grupal. ? Programas de mensajes de texto. ? Aplicaciones para telfonos celulares.  Tomar medicamentos. Algunos de estos medicamentos pueden contener nicotina. Si est embarazada o amamantando, no tome ningn medicamento  para dejar de fumar, excepto que el mdico lo autorice. Hable con el mdico sobre el asesoramiento psicolgico o sobre otras cosas que pueden ayudarlo. Hable con el mdico sobre usar ms de una estrategia al mismo tiempo, como, por ejemplo, tomar medicamentos y tambin recibir asesoramiento psicolgico. Esto puede facilitarle el proceso para dejar de fumar. QU PUEDO HACER PARA QUE DEJAR DE FUMAR SEA MS FCIL? Al principio, dejar de fumar puede parecer abrumador, pero hay muchas opciones que facilitan el proceso. Tome estas medidas:  Converse con su familia o sus amigos. Busque su apoyo y aliento.  Llame a las lneas telefnicas que ayudan a dejar de fumar, pngase en contacto con grupos de apoyo o reciba asesoramiento de un consejero.  Pdale a la gente que fuma que no lo haga a su alrededor.  Evite los lugares que pueden despertar el deseo de fumar (disparadores), como, por ejemplo: ? Bares. ? Fiestas. ? reas para fumar en el trabajo.  Pase tiempo con personas que no fuman.  Disminuya todo tipo de estrs de la vida diaria. El estrs puede hacer que usted desee fumar. Pruebe estas cosas para disminuir el estrs: ? Practicar actividad fsica con regularidad. ? Practicar ejercicios de respiracin profunda. ? Practicar yoga. ? Medite. ? Realizar una visualizacin corporal. Para ello, cierre los ojos, concntrese en una zona del cuerpo a la vez desde la cabeza hasta los dedos de los pies y fjese qu partes del cuerpo estn tensas. Relaje los msculos de esas reas.  Descargue o compre aplicaciones para telfonos mviles o tabletas que le ayuden a respetar el plan para dejar de fumar. Hay muchas aplicaciones gratuitas, como QuitGuide de   los Centros para el Control y la Prevencin de Enfermedades (CDC, Centers for Disease Control and Prevention). Puede hallar otros recursos de apoyo en smokefree.gov y en otros sitios web. Esta informacin no tiene como fin reemplazar el consejo del mdico.  Asegrese de hacerle al mdico cualquier pregunta que tenga. Document Released: 09/14/2010 Document Revised: 11/04/2011 Document Reviewed: 12/27/2014 Elsevier Interactive Patient Education  2018 Elsevier Inc.  

## 2017-08-14 NOTE — Progress Notes (Signed)
Subjective:  Patient ID: Anita Russell, female    DOB: 03/13/1956  Age: 61 y.o. MRN: [id number]  CC: Hypertension   HPI Anita Russell  Is a 61 year old female with a history of depression and anxiety, hypothyroidism, hypertension, Hyperlipidemia, SVT, possible atrial flutter (status post TEE cardioversion in 05/2017) who comes into the clinic for a follow-up visit.  She has been compliant with all her medications but complains of shortness of breath on exertion like walking from the car to the store but denies chest pain, pedal edema. She has been seen by cardiology since her procedure for a follow-up and also had a Lexiscan stress test in 06/2017 which revealed no reversible ischemia or infarction and was a low risk study.  She continues to smoke and would like help quitting smoking; she has no underlying history of COPD.  She has been compliant with her antihypertensive and her statin and tolerates it well. Her depression is controlled on current medications and she has no other acute concerns today.  Past Medical History:  Diagnosis Date  . Anxiety   . Asthma   . Depression   . Gout   . Hypertension   . Hypothyroidism   . Mental disorder   . Thyroid disease     Past Surgical History:  Procedure Laterality Date  . ABDOMINAL HYSTERECTOMY    . APPENDECTOMY    . CARDIOVERSION N/A 06/16/2017   Procedure: CARDIOVERSION;  Surgeon: Orpah CobbKadakia, Ajay, MD;  [facility];  Service: Cardiovascular;  Laterality: N/A;  . TEE WITHOUT CARDIOVERSION N/A 06/16/2017   Procedure: TRANSESOPHAGEAL ECHOCARDIOGRAM (TEE);  Surgeon: Orpah CobbKadakia, Ajay, MD;  [facility];  Service: Cardiovascular;  Laterality: N/A;    Allergies  Allergen Reactions  . Hyzaar [Losartan Potassium-Hctz] Other (See Comments)    Caused patient to feel light-headed and gave her vertigo  . Meloxicam Rash    A "very bad" rash     Outpatient Medications Prior to Visit  Medication Sig Dispense  Refill  . amiodarone (PACERONE) 200 MG tablet Take 1 tablet (200 mg total) daily by mouth. 30 tablet 3  . apixaban (ELIQUIS) 5 MG TABS tablet Take 1 tablet (5 mg total) 2 (two) times daily by mouth. 180 tablet 3  . atorvastatin (LIPITOR) 20 MG tablet Take 1 tablet (20 mg total) by mouth daily. 30 tablet 6  . fluticasone (FLONASE) 50 MCG/ACT nasal spray Place 2 sprays daily into both nostrils. 16 g 6  . hydrocortisone 2.5 % cream Apply topically 2 (two) times daily. (Patient taking differently: Apply 1 application topically 2 (two) times daily. APPLY TO BOTH LEGS) 30 g 2  . levothyroxine (SYNTHROID, LEVOTHROID) 100 MCG tablet TAKE 1 TABLET BY MOUTH DAILY BEFORE BREAKFAST FOR THYROID DISEASE. (Patient taking differently: Take 100 mcg by mouth daily before breakfast. FOR THYROID DISEASE) 30 tablet 6  . losartan (COZAAR) 50 MG tablet Take 1 tablet (50 mg total) by mouth daily. 30 tablet 11  . metoprolol tartrate (LOPRESSOR) 50 MG tablet Take 1 tablet (50 mg total) by mouth 2 (two) times daily. 180 tablet 6  . PROVENTIL HFA 108 (90 Base) MCG/ACT inhaler INHALE 2 PUFFS INTO THE LUNGS EVERY 6 HOURS AS NEEDED. FOR SHORTNESS OF BREATH (Patient taking differently: Inhale 2 puffs into the lungs every 6 hours as needed for shortness of breath) 6.7 g 1  . hydrOXYzine (ATARAX/VISTARIL) 25 MG tablet Take 1 tablet (25 mg total) by mouth 3 (three) times daily as needed. (Patient taking differently: Take 25 mg  by mouth 3 (three) times daily as needed for anxiety. ) 90 tablet 3  . sertraline (ZOLOFT) 100 MG tablet TAKE 1 TABLET BY MOUTH ONCE DAILY FOR ANXIETY AND DEPRESSION (Patient taking differently: Take 100 mg by mouth daily. FOR ANXIETY AND DEPRESSION) 30 tablet 6   No facility-administered medications prior to visit.     ROS Review of Systems  Constitutional: Negative for activity change, appetite change and fatigue.  HENT: Negative for congestion, sinus pressure and sore throat.   Eyes: Negative for visual  disturbance.  Respiratory: Positive for shortness of breath (on exertion). Negative for cough, chest tightness and wheezing.   Cardiovascular: Negative for chest pain and palpitations.  Gastrointestinal: Negative for abdominal distention, abdominal pain and constipation.  Endocrine: Negative for polydipsia.  Genitourinary: Negative for dysuria and frequency.  Musculoskeletal: Negative for arthralgias and back pain.  Skin: Negative for rash.  Neurological: Negative for tremors, light-headedness and numbness.  Hematological: Does not bruise/bleed easily.  Psychiatric/Behavioral: Negative for agitation and behavioral problems.    Objective:  BP 117/71   Pulse (!) 56   Temp 98.5 F (36.9 C) (Oral)   Ht 5\' 9"  (1.753 m)   Wt 182 lb 12.8 oz (82.9 kg)   SpO2 96%   BMI 26.99 kg/m   BP/Weight 08/14/2017 07/16/2017 07/02/2017  Systolic BP 117 131 129  Diastolic BP 71 76 82  Wt. (Lbs) 182.8 - 185.2  BMI 26.99 - 27.35  Some encounter information is confidential and restricted. Go to Review Flowsheets activity to see all data.      Physical Exam  Constitutional: She is oriented to person, place, and time. She appears well-developed and well-nourished.  Cardiovascular: Normal rate, normal heart sounds and intact distal pulses.  No murmur heard. Pulmonary/Chest: Effort normal and breath sounds normal. She has no wheezes. She has no rales. She exhibits no tenderness.  Abdominal: Soft. Bowel sounds are normal. She exhibits no distension and no mass. There is no tenderness.  Musculoskeletal: Normal range of motion.  Neurological: She is alert and oriented to person, place, and time.  Skin: Skin is warm and dry.  Psychiatric: She has a normal mood and affect.     Lipid Panel     Component Value Date/Time   CHOL 253 (H) 06/12/2017 0418   CHOL 192 02/10/2017 0908   TRIG 208 (H) 06/12/2017 0418   HDL 72 06/12/2017 0418   HDL 66 02/10/2017 0908   CHOLHDL 3.5 06/12/2017 0418   VLDL 42  (H) 06/12/2017 0418   LDLCALC 139 (H) 06/12/2017 0418   LDLCALC 63 02/10/2017 0908    Assessment & Plan:   1. Anxiety and depression Controlled - sertraline (ZOLOFT) 100 MG tablet; TAKE 1 TABLET BY MOUTH ONCE DAILY FOR ANXIETY AND DEPRESSION  Dispense: 30 tablet; Refill: 6 - hydrOXYzine (ATARAX/VISTARIL) 25 MG tablet; Take 1 tablet (25 mg total) by mouth 3 (three) times daily as needed.  Dispense: 90 tablet; Refill: 3  2. SVT (supraventricular tachycardia) (HCC) Status post TEE cardioversion Continue amiodarone, metoprolol, Eliquis Keep upcoming appointment with cardiology  3. Acquired hypothyroidism We will send of TSH and adjust levothyroxine dose accordingly - TSH  4. Hypertension, unspecified type Controlled  5. Pure hypercholesterolemia Uncontrolled Lipid panel at next visit Continue atorvastatin  6. Shortness of breath No explainable cardiac because she recently had a stress test which was low risk Given history of smoking would need to exclude COPD and she will also need a baseline PFT given she is on  amiodarone - Pulmonary function test; Future  7. Smoking Spent 3 minutes counseling on cessation and she is willing to work on quitting - nicotine (NICODERM CQ - DOSED IN MG/24 HOURS) 14 mg/24hr patch; Place 1 patch (14 mg total) onto the skin daily.  Dispense: 28 patch; Refill: 1   Meds ordered this encounter  Medications  . sertraline (ZOLOFT) 100 MG tablet    Sig: TAKE 1 TABLET BY MOUTH ONCE DAILY FOR ANXIETY AND DEPRESSION    Dispense:  30 tablet    Refill:  6  . hydrOXYzine (ATARAX/VISTARIL) 25 MG tablet    Sig: Take 1 tablet (25 mg total) by mouth 3 (three) times daily as needed.    Dispense:  90 tablet    Refill:  3  . nicotine (NICODERM CQ - DOSED IN MG/24 HOURS) 14 mg/24hr patch    Sig: Place 1 patch (14 mg total) onto the skin daily.    Dispense:  28 patch    Refill:  1    Follow-up: Return in about 3 months (around 11/12/2017) for follow up of  chronic medical conditions.   Jaclyn ShaggyEnobong Amao MD

## 2017-08-15 ENCOUNTER — Encounter: Payer: Self-pay | Admitting: Family Medicine

## 2017-08-15 LAB — TSH: TSH: 6.26 u[IU]/mL — AB (ref 0.450–4.500)

## 2017-08-15 MED ORDER — LEVOTHYROXINE SODIUM 112 MCG PO TABS: ORAL_TABLET | ORAL | 3 refills | Status: DC

## 2017-08-15 MED FILL — ?LEVOTHYROXINE 112 MCG TAB: 112 | 30 days supply | Qty: 30 | Fill #0

## 2017-08-25 MED FILL — $ELIQUIS 5 MG TABLET: 5 | 30 days supply | Qty: 60 | Fill #2

## 2017-08-28 MED FILL — LOSARTAN POTASSIUM 50 MG TA: 50 | 30 days supply | Qty: 30 | Fill #2

## 2017-09-18 MED FILL — ?LEVOTHYROXINE 112 MCG TAB: 112 | 30 days supply | Qty: 30 | Fill #1

## 2017-09-18 MED FILL — ?METOPROLOL TARTRATE 50MG T: 50 | 30 days supply | Qty: 180 | Fill #2

## 2017-09-23 MED FILL — SERTRALINE HCL 100 MG TAB: 100 | 30 days supply | Qty: 30 | Fill #1

## 2017-09-23 MED FILL — LOSARTAN POTASSIUM 50 MG TA: 50 | 30 days supply | Qty: 30 | Fill #3

## 2017-09-23 MED FILL — $ELIQUIS 5 MG TABLET: 5 | 30 days supply | Qty: 60 | Fill #3

## 2017-10-20 MED FILL — ?LEVOTHYROXINE 112 MCG TAB: 112 | 30 days supply | Qty: 30 | Fill #2

## 2017-10-20 MED FILL — LOSARTAN POTASSIUM 50 MG TA: 50 | 30 days supply | Qty: 30 | Fill #4

## 2017-10-21 ENCOUNTER — Other Ambulatory Visit: Payer: Self-pay | Admitting: Pharmacist

## 2017-10-21 MED ORDER — APIXABAN 5 MG PO TABS: 5.0000 mg | ORAL_TABLET | Freq: Two times a day (BID) | ORAL | 0 refills | Status: DC

## 2017-10-21 MED FILL — $ELIQUIS 5 MG TABLET: 5 | 30 days supply | Qty: 60 | Fill #0

## 2017-11-03 MED FILL — SERTRALINE HCL 100 MG TAB: 100 | 30 days supply | Qty: 30 | Fill #2

## 2017-11-04 MED FILL — AMIODARONE HCL 200 MG TAB: 200 | 30 days supply | Qty: 60 | Fill #2

## 2017-11-10 ENCOUNTER — Ambulatory Visit: Payer: Self-pay | Attending: Family Medicine

## 2017-11-14 ENCOUNTER — Encounter: Payer: Self-pay | Admitting: Family Medicine

## 2017-11-14 ENCOUNTER — Ambulatory Visit: Payer: Self-pay | Attending: Family Medicine | Admitting: Family Medicine

## 2017-11-14 VITALS — BP 100/62 | HR 62 | Temp 98.3°F | Ht 69.0 in | Wt 183.8 lb

## 2017-11-14 DIAGNOSIS — F329 Major depressive disorder, single episode, unspecified: Secondary | ICD-10-CM

## 2017-11-14 DIAGNOSIS — Z79899 Other long term (current) drug therapy: Secondary | ICD-10-CM | POA: Insufficient documentation

## 2017-11-14 DIAGNOSIS — E7800 Pure hypercholesterolemia, unspecified: Secondary | ICD-10-CM

## 2017-11-14 DIAGNOSIS — F419 Anxiety disorder, unspecified: Secondary | ICD-10-CM

## 2017-11-14 DIAGNOSIS — E039 Hypothyroidism, unspecified: Secondary | ICD-10-CM

## 2017-11-14 DIAGNOSIS — J4520 Mild intermittent asthma, uncomplicated: Secondary | ICD-10-CM

## 2017-11-14 DIAGNOSIS — M109 Gout, unspecified: Secondary | ICD-10-CM | POA: Insufficient documentation

## 2017-11-14 DIAGNOSIS — Z7901 Long term (current) use of anticoagulants: Secondary | ICD-10-CM | POA: Insufficient documentation

## 2017-11-14 DIAGNOSIS — I10 Essential (primary) hypertension: Secondary | ICD-10-CM

## 2017-11-14 DIAGNOSIS — I471 Supraventricular tachycardia: Secondary | ICD-10-CM | POA: Insufficient documentation

## 2017-11-14 DIAGNOSIS — F17200 Nicotine dependence, unspecified, uncomplicated: Secondary | ICD-10-CM

## 2017-11-14 DIAGNOSIS — I4892 Unspecified atrial flutter: Secondary | ICD-10-CM | POA: Insufficient documentation

## 2017-11-14 MED ORDER — LOSARTAN POTASSIUM 50 MG PO TABS: 50.0000 mg | ORAL_TABLET | Freq: Every day | ORAL | 6 refills | Status: DC

## 2017-11-14 MED ORDER — APIXABAN 5 MG PO TABS: 5.0000 mg | ORAL_TABLET | Freq: Two times a day (BID) | ORAL | 6 refills | Status: DC

## 2017-11-14 MED ORDER — NICOTINE 21 MG/24HR TD PT24: 21.0000 mg | MEDICATED_PATCH | Freq: Every day | TRANSDERMAL | 0 refills | Status: DC

## 2017-11-14 MED ORDER — ALBUTEROL SULFATE HFA 108 (90 BASE) MCG/ACT IN AERS: INHALATION_SPRAY | RESPIRATORY_TRACT | 1 refills | Status: DC

## 2017-11-14 MED ORDER — METOPROLOL TARTRATE 50 MG PO TABS: 50.0000 mg | ORAL_TABLET | Freq: Two times a day (BID) | ORAL | 6 refills | Status: DC

## 2017-11-14 MED ORDER — FLUTICASONE PROPIONATE 50 MCG/ACT NA SUSP: 2.0000 | Freq: Every day | NASAL | 6 refills | Status: DC

## 2017-11-14 MED ORDER — ATORVASTATIN CALCIUM 20 MG PO TABS: 20.0000 mg | ORAL_TABLET | Freq: Every day | ORAL | 6 refills | Status: DC

## 2017-11-14 MED FILL — ATORVASTATIN 20 MG TABLET: 20 | 30 days supply | Qty: 30 | Fill #0

## 2017-11-14 MED FILL — LOSARTAN POTASSIUM 50 MG TA: 50 | 30 days supply | Qty: 30 | Fill #0

## 2017-11-14 MED FILL — $ELIQUIS 5 MG TABLET: 5 | 30 days supply | Qty: 60 | Fill #0

## 2017-11-14 MED FILL — NICOTINE 21 MG/24HR PATCH: 21 | 28 days supply | Qty: 28 | Fill #0

## 2017-11-14 MED FILL — METOPROLOL TARTRATE 50 MG T: 50 | 30 days supply | Qty: 60 | Fill #0

## 2017-11-14 MED FILL — ALBUTEROL SULFATE HFA 108 (: 108 (90 BAS | 25 days supply | Qty: 18 | Fill #0

## 2017-11-14 NOTE — Patient Instructions (Signed)
Steps to Quit Smoking Smoking tobacco can be bad for your health. It can also affect almost every organ in your body. Smoking puts you and people around you at risk for many serious long-lasting (chronic) diseases. Quitting smoking is hard, but it is one of the best things that you can do for your health. It is never too late to quit. What are the benefits of quitting smoking? When you quit smoking, you lower your risk for getting serious diseases and conditions. They can include:  Lung cancer or lung disease.  Heart disease.  Stroke.  Heart attack.  Not being able to have children (infertility).  Weak bones (osteoporosis) and broken bones (fractures).  If you have coughing, wheezing, and shortness of breath, those symptoms may get better when you quit. You may also get sick less often. If you are pregnant, quitting smoking can help to lower your chances of having a baby of low birth weight. What can I do to help me quit smoking? Talk with your doctor about what can help you quit smoking. Some things you can do (strategies) include:  Quitting smoking totally, instead of slowly cutting back how much you smoke over a period of time.  Going to in-person counseling. You are more likely to quit if you go to many counseling sessions.  Using resources and support systems, such as: ? Online chats with a counselor. ? Phone quitlines. ? Printed self-help materials. ? Support groups or group counseling. ? Text messaging programs. ? Mobile phone apps or applications.  Taking medicines. Some of these medicines may have nicotine in them. If you are pregnant or breastfeeding, do not take any medicines to quit smoking unless your doctor says it is okay. Talk with your doctor about counseling or other things that can help you.  Talk with your doctor about using more than one strategy at the same time, such as taking medicines while you are also going to in-person counseling. This can help make  quitting easier. What things can I do to make it easier to quit? Quitting smoking might feel very hard at first, but there is a lot that you can do to make it easier. Take these steps:  Talk to your family and friends. Ask them to support and encourage you.  Call phone quitlines, reach out to support groups, or work with a counselor.  Ask people who smoke to not smoke around you.  Avoid places that make you want (trigger) to smoke, such as: ? Bars. ? Parties. ? Smoke-break areas at work.  Spend time with people who do not smoke.  Lower the stress in your life. Stress can make you want to smoke. Try these things to help your stress: ? Getting regular exercise. ? Deep-breathing exercises. ? Yoga. ? Meditating. ? Doing a body scan. To do this, close your eyes, focus on one area of your body at a time from head to toe, and notice which parts of your body are tense. Try to relax the muscles in those areas.  Download or buy apps on your mobile phone or tablet that can help you stick to your quit plan. There are many free apps, such as QuitGuide from the CDC (Centers for Disease Control and Prevention). You can find more support from smokefree.gov and other websites.  This information is not intended to replace advice given to you by your health care provider. Make sure you discuss any questions you have with your health care provider. Document Released: 06/08/2009 Document   Revised: 04/09/2016 Document Reviewed: 12/27/2014 Elsevier Interactive Patient Education  2018 Elsevier Inc.  

## 2017-11-14 NOTE — Progress Notes (Signed)
Subjective:  Patient ID: Anita Russell, female    DOB: 11/25/1955  Age: 62 y.o. MRN: [id number]  CC: Hypertension   HPI Anita BruinsMaryanne Vanderloop Is a 62 year old female with a history of depression and anxiety, hypothyroidism, hypertension, Hyperlipidemia, SVT, atrial flutter (status post TEE cardioversion in 05/2017) who comes into the clinic for a follow-up visit. Lexiscan stress test in 06/2017 revealed no reversible ischemia or infarction and was a low risk study.  She continues to smoke despite being placed on nicotine patches at her last visit; smokes 18-20 ciggaretes/day and is working on quitting  She has been compliant with her antihypertensive and her statin and tolerates it well. Her depression is controlled on current medications and she has no other acute concerns today.  Past Medical History:  Diagnosis Date  . Anxiety   . Asthma   . Depression   . Gout   . Hypertension   . Hypothyroidism   . Mental disorder   . Thyroid disease     Past Surgical History:  Procedure Laterality Date  . ABDOMINAL HYSTERECTOMY    . APPENDECTOMY    . CARDIOVERSION N/A 06/16/2017   Procedure: CARDIOVERSION;  Surgeon: Orpah CobbKadakia, Ajay, MD;  [facility];  Service: Cardiovascular;  Laterality: N/A;  . TEE WITHOUT CARDIOVERSION N/A 06/16/2017   Procedure: TRANSESOPHAGEAL ECHOCARDIOGRAM (TEE);  Surgeon: Orpah CobbKadakia, Ajay, MD;  [facility];  Service: Cardiovascular;  Laterality: N/A;    Allergies  Allergen Reactions  . Hyzaar [Losartan Potassium-Hctz] Other (See Comments)    Caused patient to feel light-headed and gave her vertigo  . Meloxicam Rash    A "very bad" rash     Outpatient Medications Prior to Visit  Medication Sig Dispense Refill  . amiodarone (PACERONE) 200 MG tablet Take 1 tablet (200 mg total) daily by mouth. 30 tablet 3  . levothyroxine (SYNTHROID, LEVOTHROID) 112 MCG tablet TAKE 1 TABLET BY MOUTH DAILY BEFORE BREAKFAST FOR THYROID DISEASE. 112 tablet  3  . sertraline (ZOLOFT) 100 MG tablet TAKE 1 TABLET BY MOUTH ONCE DAILY FOR ANXIETY AND DEPRESSION 30 tablet 6  . apixaban (ELIQUIS) 5 MG TABS tablet Take 1 tablet (5 mg total) by mouth 2 (two) times daily. 60 tablet 0  . losartan (COZAAR) 50 MG tablet Take 1 tablet (50 mg total) by mouth daily. 30 tablet 11  . metoprolol tartrate (LOPRESSOR) 50 MG tablet Take 1 tablet (50 mg total) by mouth 2 (two) times daily. 180 tablet 6  . nicotine (NICODERM CQ - DOSED IN MG/24 HOURS) 14 mg/24hr patch Place 1 patch (14 mg total) onto the skin daily. 28 patch 1  . PROVENTIL HFA 108 (90 Base) MCG/ACT inhaler INHALE 2 PUFFS INTO THE LUNGS EVERY 6 HOURS AS NEEDED. FOR SHORTNESS OF BREATH (Patient taking differently: Inhale 2 puffs into the lungs every 6 hours as needed for shortness of breath) 6.7 g 1  . hydrocortisone 2.5 % cream Apply topically 2 (two) times daily. (Patient not taking: Reported on 11/14/2017) 30 g 2  . atorvastatin (LIPITOR) 20 MG tablet Take 1 tablet (20 mg total) by mouth daily. (Patient not taking: Reported on 11/14/2017) 30 tablet 6  . fluticasone (FLONASE) 50 MCG/ACT nasal spray Place 2 sprays daily into both nostrils. (Patient not taking: Reported on 11/14/2017) 16 g 6  . hydrOXYzine (ATARAX/VISTARIL) 25 MG tablet Take 1 tablet (25 mg total) by mouth 3 (three) times daily as needed. (Patient not taking: Reported on 11/14/2017) 90 tablet 3   No facility-administered medications prior  to visit.     ROS Review of Systems  Constitutional: Negative for activity change, appetite change and fatigue.  HENT: Negative for congestion, sinus pressure and sore throat.   Eyes: Negative for visual disturbance.  Respiratory: Negative for cough, chest tightness, shortness of breath and wheezing.   Cardiovascular: Negative for chest pain and palpitations.  Gastrointestinal: Negative for abdominal distention, abdominal pain and constipation.  Endocrine: Negative for polydipsia.  Genitourinary: Negative  for dysuria and frequency.  Musculoskeletal: Negative for arthralgias and back pain.  Skin: Negative for rash.  Neurological: Negative for tremors, light-headedness and numbness.  Hematological: Does not bruise/bleed easily.  Psychiatric/Behavioral: Negative for agitation and behavioral problems.    Objective:  BP 100/62   Pulse 62   Temp 98.3 F (36.8 C) (Oral)   Ht 5\' 9"  (1.753 m)   Wt 183 lb 12.8 oz (83.4 kg)   SpO2 97%   BMI 27.14 kg/m   BP/Weight 11/14/2017 08/14/2017 07/16/2017  Systolic BP 100 117 131  Diastolic BP 62 71 76  Wt. (Lbs) 183.8 182.8 -  BMI 27.14 26.99 -  Some encounter information is confidential and restricted. Go to Review Flowsheets activity to see all data.      Physical Exam  Constitutional: She is oriented to person, place, and time. She appears well-developed and well-nourished.  Cardiovascular: Normal rate, normal heart sounds and intact distal pulses.  No murmur heard. Pulmonary/Chest: Effort normal and breath sounds normal. She has no wheezes. She has no rales. She exhibits no tenderness.  Abdominal: Soft. Bowel sounds are normal. She exhibits no distension and no mass. There is no tenderness.  Musculoskeletal: Normal range of motion.  Neurological: She is alert and oriented to person, place, and time.  Skin: Skin is warm and dry.  Psychiatric: She has a normal mood and affect.     CMP Latest Ref Rng & Units 07/02/2017 06/14/2017 06/13/2017  Glucose 65 - 99 mg/dL 87 99 098(J)  BUN 8 - 27 mg/dL 12 12 13   Creatinine 0.57 - 1.00 mg/dL 1.91 4.78 2.95  Sodium 134 - 144 mmol/L 139 138 138  Potassium 3.5 - 5.2 mmol/L 4.7 4.2 3.2(L)  Chloride 96 - 106 mmol/L 100 100(L) 102  CO2 20 - 29 mmol/L 27 29 28   Calcium 8.7 - 10.3 mg/dL 9.3 9.2 8.9  Total Protein 6.5 - 8.1 g/dL - - -  Total Bilirubin 0.3 - 1.2 mg/dL - - -  Alkaline Phos 38 - 126 U/L - - -  AST 15 - 41 U/L - - -  ALT 14 - 54 U/L - - -    Lipid Panel     Component Value Date/Time     CHOL 253 (H) 06/12/2017 0418   CHOL 192 02/10/2017 0908   TRIG 208 (H) 06/12/2017 0418   HDL 72 06/12/2017 0418   HDL 66 02/10/2017 0908   CHOLHDL 3.5 06/12/2017 0418   VLDL 42 (H) 06/12/2017 0418   LDLCALC 139 (H) 06/12/2017 0418   LDLCALC 63 02/10/2017 0908    Assessment & Plan:   1. Pure hypercholesterolemia Controlled Low cholesterol diet - atorvastatin (LIPITOR) 20 MG tablet; Take 1 tablet (20 mg total) by mouth daily.  Dispense: 30 tablet; Refill: 6  2. Smoking - nicotine (NICODERM CQ - DOSED IN MG/24 HOURS) 21 mg/24hr patch; Place 1 patch (21 mg total) onto the skin daily. For 1 month then 14 mg patch daily for 1 month then 7 mg patch daily for 1 month.  Dispense: 90 patch; Refill: 0  3. Anxiety and depression Stable Continue Zoloft  4. Essential hypertension Controlled - metoprolol tartrate (LOPRESSOR) 50 MG tablet; Take 1 tablet (50 mg total) by mouth 2 (two) times daily.  Dispense: 180 tablet; Refill: 6  5. Mild intermittent asthma without complication Stable - albuterol (PROVENTIL HFA) 108 (90 Base) MCG/ACT inhaler; Inhale 2 puffs into the lungs every 6 hours as needed for shortness of breath  Dispense: 6.7 g; Refill: 1  6. Acquired hypothyroidism - TSH   Meds ordered this encounter  Medications  . nicotine (NICODERM CQ - DOSED IN MG/24 HOURS) 21 mg/24hr patch    Sig: Place 1 patch (21 mg total) onto the skin daily. For 1 month then 14 mg patch daily for 1 month then 7 mg patch daily for 1 month.    Dispense:  90 patch    Refill:  0  . apixaban (ELIQUIS) 5 MG TABS tablet    Sig: Take 1 tablet (5 mg total) by mouth 2 (two) times daily.    Dispense:  60 tablet    Refill:  6  . atorvastatin (LIPITOR) 20 MG tablet    Sig: Take 1 tablet (20 mg total) by mouth daily.    Dispense:  30 tablet    Refill:  6  . fluticasone (FLONASE) 50 MCG/ACT nasal spray    Sig: Place 2 sprays into both nostrils daily.    Dispense:  16 g    Refill:  6  . losartan (COZAAR)  50 MG tablet    Sig: Take 1 tablet (50 mg total) by mouth daily.    Dispense:  30 tablet    Refill:  6  . metoprolol tartrate (LOPRESSOR) 50 MG tablet    Sig: Take 1 tablet (50 mg total) by mouth 2 (two) times daily.    Dispense:  180 tablet    Refill:  6  . albuterol (PROVENTIL HFA) 108 (90 Base) MCG/ACT inhaler    Sig: Inhale 2 puffs into the lungs every 6 hours as needed for shortness of breath    Dispense:  6.7 g    Refill:  1    Follow-up: Return in about 3 months (around 02/14/2018) for Follow up of chronic medical conditions.   Hoy Register MD

## 2017-11-15 LAB — TSH: TSH: 6.06 u[IU]/mL — AB (ref 0.450–4.500)

## 2017-11-16 MED ORDER — LEVOTHYROXINE SODIUM 125 MCG PO TABS: ORAL_TABLET | ORAL | 3 refills | Status: DC

## 2017-11-17 MED FILL — LEVOTHYROXINE 125 MCG TAB: 125 | 30 days supply | Qty: 30 | Fill #0

## 2017-11-18 DIAGNOSIS — I10 Essential (primary) hypertension: Secondary | ICD-10-CM

## 2017-11-19 ENCOUNTER — Telehealth: Payer: Self-pay

## 2017-11-19 NOTE — Telephone Encounter (Signed)
Patient was called and informed of lab results and medication change. 

## 2017-12-03 ENCOUNTER — Other Ambulatory Visit: Payer: Self-pay

## 2017-12-03 ENCOUNTER — Encounter (HOSPITAL_COMMUNITY): Payer: Self-pay | Admitting: Emergency Medicine

## 2017-12-03 ENCOUNTER — Ambulatory Visit (HOSPITAL_COMMUNITY)
Admission: EM | Admit: 2017-12-03 | Discharge: 2017-12-03 | Disposition: A | Discharge status: Home / Self Care | Payer: Self-pay | Attending: Urgent Care | Admitting: Urgent Care

## 2017-12-03 DIAGNOSIS — M109 Gout, unspecified: Secondary | ICD-10-CM

## 2017-12-03 MED ORDER — COLCHICINE 0.6 MG PO TABS: ORAL_TABLET | ORAL | 1 refills | Status: DC

## 2017-12-03 MED ORDER — PREDNISONE 20 MG PO TABS: ORAL_TABLET | ORAL | 0 refills | Status: DC

## 2017-12-03 MED FILL — predniSONE 20 MG TABS: 20 | 9 days supply | Qty: 18 | Fill #0

## 2017-12-03 MED FILL — COLCHICINE 0.6 MG TABS: 0.6 | 10 days supply | Qty: 10 | Fill #0

## 2017-12-03 NOTE — Discharge Instructions (Addendum)
Use prednisone for this gout attack. Then in the future use colchicine at the start of a gout attack. Work with your PCP to figure out why you're getting gout and ask about allopurinol, a medication that can prevent gout attacks.

## 2017-12-03 NOTE — ED Triage Notes (Signed)
Pt reports gout flare up in her left foot that started this weekend.  Pt goes to [facility] for her PCP.

## 2017-12-03 NOTE — ED Provider Notes (Signed)
MRN: [id number] DOB: 04/12/1956  Subjective:   Anita Russell is a 62 y.o. female presenting for recurrent gout attack. Last episode was summer of 2018 and states that she does not get frequent episodes. She avoids gout causing foods and does not take medications that can cause gout. This episode has been intermittent for 5 days and became full blow yesterday. She has not tried any medications for relief. Denies fever, trauma, weakness, infection.  No current facility-administered medications for this encounter.   Current Outpatient Medications:  .  albuterol (PROVENTIL HFA) 108 (90 Base) MCG/ACT inhaler, Inhale 2 puffs into the lungs every 6 hours as needed for shortness of breath, Disp: 6.7 g, Rfl: 1 .  amiodarone (PACERONE) 200 MG tablet, Take 1 tablet (200 mg total) daily by mouth., Disp: 30 tablet, Rfl: 3 .  apixaban (ELIQUIS) 5 MG TABS tablet, Take 1 tablet (5 mg total) by mouth 2 (two) times daily., Disp: 60 tablet, Rfl: 6 .  atorvastatin (LIPITOR) 20 MG tablet, Take 1 tablet (20 mg total) by mouth daily., Disp: 30 tablet, Rfl: 6 .  fluticasone (FLONASE) 50 MCG/ACT nasal spray, Place 2 sprays into both nostrils daily., Disp: 16 g, Rfl: 6 .  levothyroxine (SYNTHROID, LEVOTHROID) 125 MCG tablet, TAKE 1 TABLET BY MOUTH DAILY BEFORE BREAKFAST ., Disp: 30 tablet, Rfl: 3 .  losartan (COZAAR) 50 MG tablet, Take 1 tablet (50 mg total) by mouth daily., Disp: 30 tablet, Rfl: 6 .  metoprolol tartrate (LOPRESSOR) 50 MG tablet, Take 1 tablet (50 mg total) by mouth 2 (two) times daily., Disp: 180 tablet, Rfl: 6 .  nicotine (NICODERM CQ - DOSED IN MG/24 HOURS) 21 mg/24hr patch, Place 1 patch (21 mg total) onto the skin daily. For 1 month then 14 mg patch daily for 1 month then 7 mg patch daily for 1 month., Disp: 90 patch, Rfl: 0 .  sertraline (ZOLOFT) 100 MG tablet, TAKE 1 TABLET BY MOUTH ONCE DAILY FOR ANXIETY AND DEPRESSION, Disp: 30 tablet, Rfl: 6 .  hydrocortisone 2.5 % cream, Apply topically 2  (two) times daily. (Patient not taking: Reported on 11/14/2017), Disp: 30 g, Rfl: 2   Allergies  Allergen Reactions  . Hyzaar [Losartan Potassium-Hctz] Other (See Comments)    Caused patient to feel light-headed and gave her vertigo  . Meloxicam Rash    A "very bad" rash    Past Medical History:  Diagnosis Date  . Anxiety   . Asthma   . Depression   . Gout   . Hypertension   . Hypothyroidism   . Mental disorder   . Thyroid disease      Past Surgical History:  Procedure Laterality Date  . ABDOMINAL HYSTERECTOMY    . APPENDECTOMY    . CARDIOVERSION N/A 06/16/2017   Procedure: CARDIOVERSION;  Surgeon: Orpah Cobb, MD;  Location[facility] ENDOSCOPY;  Service: Cardiovascular;  Laterality: N/A;  . TEE WITHOUT CARDIOVERSION N/A 06/16/2017   Procedure: TRANSESOPHAGEAL ECHOCARDIOGRAM (TEE);  Surgeon: Orpah Cobb, MD;  Location: [facility] ENDOSCOPY;  Service: Cardiovascular;  Laterality: N/A;    Objective:   Vitals: BP 137/87 (BP Location: Right Wrist)   Pulse 76   Temp (!) 97.4 F (36.3 C) (Oral)   SpO2 95%   Physical Exam  Constitutional: She is oriented to person, place, and time. She appears well-developed and well-nourished.  Cardiovascular: Normal rate.  Pulmonary/Chest: Effort normal.  Musculoskeletal:       Left foot: There is decreased range of motion, tenderness and swelling.  Feet:  Neurological: She is alert and oriented to person, place, and time.   Assessment and Plan :   Acute gout involving toe of left foot, unspecified cause  Start short steroid course. Provided patient with script for colchicine for future attacks. She is to follow up with PCP and continue work up on source of her gout attacks.   Wallis BambergMani, Maanasa Aderhold, [place]-C 12/03/17 1442

## 2017-12-22 MED FILL — LEVOTHYROXINE 125 MCG TAB: 125 | 30 days supply | Qty: 30 | Fill #1

## 2017-12-22 MED FILL — $ELIQUIS 5 MG TABLET: 5 | 30 days supply | Qty: 60 | Fill #1

## 2017-12-26 MED FILL — ?ATORVASTATIN 20 MG TABLET: 20 | 30 days supply | Qty: 30 | Fill #1

## 2017-12-26 MED FILL — LOSARTAN POTASSIUM 50 MG TA: 50 | 30 days supply | Qty: 30 | Fill #1

## 2018-01-20 MED FILL — ?METOPROLOL 50 MG TABLET: 50 | 30 days supply | Qty: 60 | Fill #1

## 2018-01-20 MED FILL — LEVOTHYROXINE 125 MCG TAB: 125 | 30 days supply | Qty: 30 | Fill #2

## 2018-01-20 MED FILL — $ELIQUIS 5 MG TABLET: 5 | 30 days supply | Qty: 60 | Fill #2

## 2018-01-27 MED FILL — ?ATORVASTATIN 20 MG TABLET: 20 | 30 days supply | Qty: 30 | Fill #2

## 2018-01-27 MED FILL — LOSARTAN POTASSIUM 50 MG TA: 50 | 30 days supply | Qty: 30 | Fill #2

## 2018-02-07 ENCOUNTER — Other Ambulatory Visit: Payer: Self-pay

## 2018-02-07 ENCOUNTER — Ambulatory Visit (HOSPITAL_COMMUNITY)
Admission: EM | Admit: 2018-02-07 | Discharge: 2018-02-07 | Disposition: A | Discharge status: Home / Self Care | Payer: Self-pay | Attending: Family Medicine | Admitting: Family Medicine

## 2018-02-07 ENCOUNTER — Encounter (HOSPITAL_COMMUNITY): Payer: Self-pay

## 2018-02-07 DIAGNOSIS — K0889 Other specified disorders of teeth and supporting structures: Secondary | ICD-10-CM

## 2018-02-07 MED ORDER — TRAMADOL HCL 50 MG PO TABS: 50.0000 mg | ORAL_TABLET | Freq: Four times a day (QID) | ORAL | 0 refills | Status: DC | PRN

## 2018-02-07 MED ORDER — PENICILLIN V POTASSIUM 500 MG PO TABS: 500.0000 mg | ORAL_TABLET | Freq: Four times a day (QID) | ORAL | 0 refills | Status: AC

## 2018-02-07 MED ORDER — ACETAMINOPHEN 500 MG PO TABS: 1000.0000 mg | ORAL_TABLET | Freq: Three times a day (TID) | ORAL | 0 refills | Status: DC | PRN

## 2018-02-07 NOTE — Discharge Instructions (Signed)
Complete course of antibiotics.  Use of tylenol for pain, ice application. Tramadol for breakthrough pain. Please follow up with dentist for definitive treatment.

## 2018-02-07 NOTE — ED Triage Notes (Signed)
Pt presents today with mouth pain on left side. Pt is very upset and in a lot of pain. States this has been going on since the day before yesterday. Feels like her face is going to "explode". Hurts to eat or chew.

## 2018-02-07 NOTE — ED Provider Notes (Signed)
[facility]    CSN: [id number] Arrival date & time: 02/07/18  1256     History   Chief Complaint Chief Complaint  Patient presents with  . Dental Pain    HPI Anita Russell is a 62 y.o. female.   Macon LargeMaryanne presents with complaints of left lower dental pain which started two days ago. 10/10 in severity, worsening. Took aspirin today which didn't help. No fevers. No URI symptoms. States has broken her teeth, does not have a dentist. No fevers. She is on eliquis. Hx anxiety, asthma, depression, htn, hypothyroidism, svt.    ROS per HPI.      Past Medical History:  Diagnosis Date  . Anxiety   . Asthma   . Depression   . Gout   . Hypertension   . Hypothyroidism   . Mental disorder   . Thyroid disease     Patient Active Problem List   Diagnosis Date Noted  . SVT (supraventricular tachycardia) (HCC) 06/11/2017  . Cervical high risk HPV (human papillomavirus) test positive 04/01/2017  . Asthma 02/04/2017  . Hyperlipidemia 07/05/2016  . Loss of weight 07/13/2015  . Chronic RUQ pain 07/13/2015  . Diarrhea 07/13/2015  . Smoking 04/08/2014  . IFG (impaired fasting glucose) 04/08/2014  . Arthritis 10/29/2013  . Anxiety and depression 10/29/2013  . Periodic health assessment, general screening, adult 10/29/2013  . Tinnitus 10/29/2013  . Anxiety 06/17/2012  . Alcohol abuse 06/14/2012  . Gout 06/14/2012  . Hypertension 06/14/2012  . Hypothyroidism 06/14/2012    Past Surgical History:  Procedure Laterality Date  . ABDOMINAL HYSTERECTOMY    . APPENDECTOMY    . CARDIOVERSION N/A 06/16/2017   Procedure: CARDIOVERSION;  Surgeon: Orpah CobbKadakia, Ajay, MD;  [facility];  Service: Cardiovascular;  Laterality: N/A;  . TEE WITHOUT CARDIOVERSION N/A 06/16/2017   Procedure: TRANSESOPHAGEAL ECHOCARDIOGRAM (TEE);  Surgeon: Orpah CobbKadakia, Ajay, MD;  [facility];  Service: Cardiovascular;  Laterality: N/A;    OB History    Gravida  2   Para      Term       Preterm      AB  1   Living  1     SAB      TAB      Ectopic      Multiple      Live Births  1            Home Medications    Prior to Admission medications   Medication Sig Start Date End Date Taking? Authorizing Provider  albuterol (PROVENTIL HFA) 108 (90 Base) MCG/ACT inhaler Inhale 2 puffs into the lungs every 6 hours as needed for shortness of breath 11/14/17  Yes Newlin, Odette HornsEnobong, MD  amiodarone (PACERONE) 200 MG tablet Take 1 tablet (200 mg total) daily by mouth. 07/02/17  Yes McClung, Marzella SchleinAngela M, PA-C  apixaban (ELIQUIS) 5 MG TABS tablet Take 1 tablet (5 mg total) by mouth 2 (two) times daily. 11/14/17  Yes Hoy RegisterNewlin, [name], MD  atorvastatin (LIPITOR) 20 MG tablet Take 1 tablet (20 mg total) by mouth daily. 11/14/17  Yes Hoy RegisterNewlin, [name], MD  colchicine 0.6 MG tablet Take 2 tablets at the start of a gout attack. Then 1 more tablet an hour later. Do not repeat this dosing for 2 days. 12/03/17  Yes Wallis BambergMani, Mario, PA-C  fluticasone (FLONASE) 50 MCG/ACT nasal spray Place 2 sprays into both nostrils daily. 11/14/17  Yes Hoy RegisterNewlin, [name], MD  hydrocortisone 2.5 % cream Apply topically 2 (two) times daily.  03/11/17  Yes Newlin, Odette Horns, MD  levothyroxine (SYNTHROID, LEVOTHROID) 125 MCG tablet TAKE 1 TABLET BY MOUTH DAILY BEFORE BREAKFAST . 11/16/17  Yes Newlin, [name], MD  losartan (COZAAR) 50 MG tablet Take 1 tablet (50 mg total) by mouth daily. 11/14/17 11/14/18 Yes Hoy Register, MD  metoprolol tartrate (LOPRESSOR) 50 MG tablet Take 1 tablet (50 mg total) by mouth 2 (two) times daily. 11/14/17  Yes Newlin, Odette Horns, MD  nicotine (NICODERM CQ - DOSED IN MG/24 HOURS) 21 mg/24hr patch Place 1 patch (21 mg total) onto the skin daily. For 1 month then 14 mg patch daily for 1 month then 7 mg patch daily for 1 month. 11/14/17  Yes Newlin, [name], MD  predniSONE (DELTASONE) 20 MG tablet Take 3 tablets daily with breakfast for Day 1-3. Then take 2 tablets Days 4-6 and 1 tablet for Days 7-9.  12/03/17  Yes Wallis Bamberg, PA-C  sertraline (ZOLOFT) 100 MG tablet TAKE 1 TABLET BY MOUTH ONCE DAILY FOR ANXIETY AND DEPRESSION 08/14/17  Yes Hoy Register, MD  acetaminophen (TYLENOL) 500 MG tablet Take 2 tablets (1,000 mg total) by mouth every 8 (eight) hours as needed for mild pain, moderate pain or headache. 02/07/18   Georgetta Haber, NP  penicillin v potassium (VEETID) 500 MG tablet Take 1 tablet (500 mg total) by mouth 4 (four) times daily for 7 days. 02/07/18 02/14/18  Georgetta Haber, NP  traMADol (ULTRAM) 50 MG tablet Take 1 tablet (50 mg total) by mouth every 6 (six) hours as needed. 02/07/18   Georgetta Haber, NP    Family History Family History  Problem Relation Age of Onset  . Arthritis Father   . Hypertension Father   . Gout Father   . Hypertension Mother     Social History Social History   Tobacco Use  . Smoking status: Current Every Day Smoker    Packs/day: 1.00    Types: Cigarettes  . Smokeless tobacco: Never Used  Substance Use Topics  . Alcohol use: No    Alcohol/week: 2.4 - 4.2 oz    Types: 1 - 3 Glasses of wine, 3 - 4 Cans of beer per week    Frequency: Never    Comment: 3 times weekly  . Drug use: No     Allergies   Hyzaar [losartan potassium-hctz] and Meloxicam   Review of Systems Review of Systems   Physical Exam Triage Vital Signs ED Triage Vitals  Enc Vitals Group     BP 02/07/18 1343 (!) 181/90     Pulse Rate 02/07/18 1343 70     Resp 02/07/18 1343 16     Temp 02/07/18 1343 98.6 F (37 C)     Temp Source 02/07/18 1343 Oral     SpO2 02/07/18 1343 100 %     Weight --      Height --      Head Circumference --      Peak Flow --      Pain Score 02/07/18 1345 10     Pain Loc --      Pain Edu? --      Excl. in GC? --    No data found.  Updated Vital Signs BP (!) 181/90 (BP Location: Right Arm)   Pulse 70   Temp 98.6 F (37 C) (Oral)   Resp 16   SpO2 100%   Visual Acuity Right Eye Distance:   Left Eye Distance:   Bilateral  Distance:    Right Eye  Near:   Left Eye Near:    Bilateral Near:     Physical Exam  Constitutional: She is oriented to person, place, and time. She appears well-developed and well-nourished. No distress.  HENT:  Head: Normocephalic and atraumatic.  Right Ear: External ear normal.  Left Ear: External ear normal.  Mouth/Throat: Abnormal dentition. Dental caries present.  Tooth broken #16 with previous crown in place; caries noted throughout; tenderness at jawline at tooth #16; no obvious visible swelling or abscess presence ; no facial swelling   Cardiovascular: Normal rate, regular rhythm and normal heart sounds.  Pulmonary/Chest: Effort normal and breath sounds normal.  Neurological: She is alert and oriented to person, place, and time.  Skin: Skin is warm and dry.     UC Treatments / Results  Labs (all labs ordered are listed, but only abnormal results are displayed) Labs Reviewed - No data to display  EKG None  Radiology No results found.  Procedures Procedures (including critical care time)  Medications Ordered in UC Medications - No data to display  Initial Impression / Assessment and Plan / UC Course  I have reviewed the triage vital signs and the nursing notes.  Pertinent labs & imaging results that were available during my care of the patient were reviewed by me and considered in my medical decision making (see chart for details).     Broken tooth with previous crown still partially intact. Course of antibiotics initiated. 10 tramadol provided as patient is on eliquis. Tylenol for pain. Encouraged follow up with dentist for definitive treatment. Patient verbalized understanding and agreeable to plan.    Final Clinical Impressions(s) / UC Diagnoses   Final diagnoses:  Pain, dental     Discharge Instructions     Complete course of antibiotics.  Use of tylenol for pain, ice application. Tramadol for breakthrough pain. Please follow up with dentist for  definitive treatment.    ED Prescriptions    Medication Sig Dispense Auth. Provider   penicillin v potassium (VEETID) 500 MG tablet Take 1 tablet (500 mg total) by mouth 4 (four) times daily for 7 days. 28 tablet Linus Mako B, NP   traMADol (ULTRAM) 50 MG tablet Take 1 tablet (50 mg total) by mouth every 6 (six) hours as needed. 10 tablet Linus Mako B, NP   acetaminophen (TYLENOL) 500 MG tablet Take 2 tablets (1,000 mg total) by mouth every 8 (eight) hours as needed for mild pain, moderate pain or headache. 30 tablet Georgetta Haber, NP     Controlled Substance Prescriptions [place] Controlled Substance Registry consulted? Yes, I have consulted the [place] Controlled Substances Registry for this patient, and feel the risk/benefit ratio today is favorable for proceeding with this prescription for a controlled substance.   Georgetta Haber, NP 02/07/18 1432

## 2018-02-08 ENCOUNTER — Encounter (HOSPITAL_COMMUNITY): Payer: Self-pay | Admitting: Emergency Medicine

## 2018-02-08 ENCOUNTER — Ambulatory Visit (HOSPITAL_COMMUNITY)
Admission: EM | Admit: 2018-02-08 | Discharge: 2018-02-08 | Disposition: A | Discharge status: Home / Self Care | Payer: Self-pay | Attending: Family Medicine | Admitting: Family Medicine

## 2018-02-08 ENCOUNTER — Telehealth (HOSPITAL_COMMUNITY): Payer: Self-pay | Admitting: Emergency Medicine

## 2018-02-08 DIAGNOSIS — K047 Periapical abscess without sinus: Secondary | ICD-10-CM

## 2018-02-08 DIAGNOSIS — K0889 Other specified disorders of teeth and supporting structures: Secondary | ICD-10-CM

## 2018-02-08 MED ORDER — CEFTRIAXONE SODIUM 1 G IJ SOLR
1.0000 g | Freq: Once | INTRAMUSCULAR | Status: AC
  Administered 2018-02-08: 1 g via INTRAMUSCULAR

## 2018-02-08 MED ORDER — TRAMADOL HCL 50 MG PO TABS: 100.0000 mg | ORAL_TABLET | Freq: Four times a day (QID) | ORAL | 0 refills | Status: DC | PRN

## 2018-02-08 MED ORDER — KETOROLAC TROMETHAMINE 60 MG/2ML IM SOLN
60.0000 mg | Freq: Once | INTRAMUSCULAR | Status: AC
  Administered 2018-02-08: 60 mg via INTRAMUSCULAR

## 2018-02-08 MED ORDER — KETOROLAC TROMETHAMINE 60 MG/2ML IM SOLN
INTRAMUSCULAR | Status: AC
  Filled 2018-02-08: qty 2

## 2018-02-08 MED ORDER — CEFTRIAXONE SODIUM 1 G IJ SOLR
INTRAMUSCULAR | Status: AC
  Filled 2018-02-08: qty 10

## 2018-02-08 NOTE — ED Provider Notes (Signed)
[facility]    CSN: [id number] Arrival date & time: 02/08/18  1558     History   Chief Complaint Chief Complaint  Patient presents with  . Dental Pain    HPI Anita BruinsMaryanne Russell is a 62 y.o. female.   HPI  Patient was seen yesterday for dental pain.  I reviewed that note.  She had pain in the left lower jaw.  There was not any redness or swelling.  She was given penicillin VK and tramadol for pain.  She is here today because the pain has intensified.  The swelling is worse.  She is not getting good pain relief from just the tramadol.  She does not think she will be able to last until she can see a dentist tomorrow.  She was told to take 1 tramadol every 6 hours.  Have advised her that she can take 2, use ice, and I will refill her medication.  Past Medical History:  Diagnosis Date  . Anxiety   . Asthma   . Depression   . Gout   . Hypertension   . Hypothyroidism   . Mental disorder   . Thyroid disease     Patient Active Problem List   Diagnosis Date Noted  . SVT (supraventricular tachycardia) (HCC) 06/11/2017  . Cervical high risk HPV (human papillomavirus) test positive 04/01/2017  . Asthma 02/04/2017  . Hyperlipidemia 07/05/2016  . Loss of weight 07/13/2015  . Chronic RUQ pain 07/13/2015  . Diarrhea 07/13/2015  . Smoking 04/08/2014  . IFG (impaired fasting glucose) 04/08/2014  . Arthritis 10/29/2013  . Anxiety and depression 10/29/2013  . Periodic health assessment, general screening, adult 10/29/2013  . Tinnitus 10/29/2013  . Anxiety 06/17/2012  . Alcohol abuse 06/14/2012  . Gout 06/14/2012  . Hypertension 06/14/2012  . Hypothyroidism 06/14/2012    Past Surgical History:  Procedure Laterality Date  . ABDOMINAL HYSTERECTOMY    . APPENDECTOMY    . CARDIOVERSION N/A 06/16/2017   Procedure: CARDIOVERSION;  Surgeon: Orpah CobbKadakia, Ajay, MD;  [facility];  Service: Cardiovascular;  Laterality: N/A;  . TEE WITHOUT CARDIOVERSION N/A 06/16/2017   Procedure: TRANSESOPHAGEAL ECHOCARDIOGRAM (TEE);  Surgeon: Orpah CobbKadakia, Ajay, MD;  [facility];  Service: Cardiovascular;  Laterality: N/A;    OB History    Gravida  2   Para      Term      Preterm      AB  1   Living  1     SAB      TAB      Ectopic      Multiple      Live Births  1            Home Medications    Prior to Admission medications   Medication Sig Start Date End Date Taking? Authorizing Provider  acetaminophen (TYLENOL) 500 MG tablet Take 2 tablets (1,000 mg total) by mouth every 8 (eight) hours as needed for mild pain, moderate pain or headache. 02/07/18   Georgetta HaberBurky, Natalie B, NP  albuterol (PROVENTIL HFA) 108 (90 Base) MCG/ACT inhaler Inhale 2 puffs into the lungs every 6 hours as needed for shortness of breath 11/14/17   Hoy RegisterNewlin, [name], MD  amiodarone (PACERONE) 200 MG tablet Take 1 tablet (200 mg total) daily by mouth. 07/02/17   Anders SimmondsMcClung, Angela M, PA-C  apixaban (ELIQUIS) 5 MG TABS tablet Take 1 tablet (5 mg total) by mouth 2 (two) times daily. 11/14/17   Hoy RegisterNewlin, [name], MD  atorvastatin (LIPITOR)  20 MG tablet Take 1 tablet (20 mg total) by mouth daily. 11/14/17   Hoy Register, MD  colchicine 0.6 MG tablet Take 2 tablets at the start of a gout attack. Then 1 more tablet an hour later. Do not repeat this dosing for 2 days. 12/03/17   Wallis Bamberg, PA-C  fluticasone (FLONASE) 50 MCG/ACT nasal spray Place 2 sprays into both nostrils daily. 11/14/17   Hoy Register, MD  hydrocortisone 2.5 % cream Apply topically 2 (two) times daily. 03/11/17   Hoy Register, MD  levothyroxine (SYNTHROID, LEVOTHROID) 125 MCG tablet TAKE 1 TABLET BY MOUTH DAILY BEFORE BREAKFAST . 11/16/17   Hoy Register, MD  losartan (COZAAR) 50 MG tablet Take 1 tablet (50 mg total) by mouth daily. 11/14/17 11/14/18  Hoy Register, MD  metoprolol tartrate (LOPRESSOR) 50 MG tablet Take 1 tablet (50 mg total) by mouth 2 (two) times daily. 11/14/17   Hoy Register, MD  nicotine  (NICODERM CQ - DOSED IN MG/24 HOURS) 21 mg/24hr patch Place 1 patch (21 mg total) onto the skin daily. For 1 month then 14 mg patch daily for 1 month then 7 mg patch daily for 1 month. 11/14/17   Hoy Register, MD  penicillin v potassium (VEETID) 500 MG tablet Take 1 tablet (500 mg total) by mouth 4 (four) times daily for 7 days. 02/07/18 02/14/18  Georgetta Haber, NP  predniSONE (DELTASONE) 20 MG tablet Take 3 tablets daily with breakfast for Day 1-3. Then take 2 tablets Days 4-6 and 1 tablet for Days 7-9. 12/03/17   Wallis Bamberg, PA-C  sertraline (ZOLOFT) 100 MG tablet TAKE 1 TABLET BY MOUTH ONCE DAILY FOR ANXIETY AND DEPRESSION 08/14/17   Hoy Register, MD  traMADol (ULTRAM) 50 MG tablet Take 2 tablets (100 mg total) by mouth every 6 (six) hours as needed. 02/08/18   Eustace Moore, MD    Family History Family History  Problem Relation Age of Onset  . Arthritis Father   . Hypertension Father   . Gout Father   . Hypertension Mother     Social History Social History   Tobacco Use  . Smoking status: Current Every Day Smoker    Packs/day: 1.00    Types: Cigarettes  . Smokeless tobacco: Never Used  Substance Use Topics  . Alcohol use: No    Alcohol/week: 2.4 - 4.2 oz    Types: 1 - 3 Glasses of wine, 3 - 4 Cans of beer per week    Frequency: Never    Comment: 3 times weekly  . Drug use: No     Allergies   Hyzaar [losartan potassium-hctz] and Meloxicam   Review of Systems Review of Systems  Constitutional: Negative for chills and fever.  HENT: Positive for dental problem. Negative for ear pain and sore throat.   Eyes: Negative for pain and visual disturbance.  Respiratory: Negative for cough and shortness of breath.   Cardiovascular: Negative for chest pain and palpitations.  Gastrointestinal: Negative for abdominal pain and vomiting.  Genitourinary: Negative for dysuria and hematuria.  Musculoskeletal: Negative for arthralgias and back pain.  Skin: Negative for color  change and rash.  Neurological: Negative for seizures and syncope.  All other systems reviewed and are negative.    Physical Exam Triage Vital Signs ED Triage Vitals [02/08/18 1608]  Enc Vitals Group     BP (!) 130/95     Pulse Rate 95     Resp 16     Temp 98.7 F (37.1 C)  Temp src      SpO2 100 %     Weight      Height      Head Circumference      Peak Flow      Pain Score 9     Pain Loc      Pain Edu?      Excl. in GC?    No data found.  Updated Vital Signs BP (!) 130/95   Pulse 95   Temp 98.7 F (37.1 C)   Resp 16   SpO2 100%      Physical Exam  Constitutional: She appears well-developed and well-nourished. No distress.  HENT:  Head: Normocephalic and atraumatic.    Mouth/Throat: Oropharynx is clear and moist.    Eyes: Pupils are equal, round, and reactive to light. Conjunctivae are normal.  Neck: Normal range of motion.  Cardiovascular: Normal rate.  Pulmonary/Chest: Effort normal. No respiratory distress.  Abdominal: Soft. She exhibits no distension.  Musculoskeletal: Normal range of motion. She exhibits no edema.  Neurological: She is alert.  Skin: Skin is warm and dry.     UC Treatments / Results  Labs (all labs ordered are listed, but only abnormal results are displayed) Labs Reviewed - No data to display  EKG None  Radiology No results found.  Procedures Procedures (including critical care time)  Medications Ordered in UC Medications  ketorolac (TORADOL) injection 60 mg (60 mg Intramuscular Given 02/08/18 1658)  cefTRIAXone (ROCEPHIN) injection 1 g (1 g Intramuscular Given 02/08/18 1658)    Initial Impression / Assessment and Plan / UC Course  I have reviewed the triage vital signs and the nursing notes.  Pertinent labs & imaging results that were available during my care of the patient were reviewed by me and considered in my medical decision making (see chart for details).    Patient is developing worsening swelling and  pain on penicillin.  She cannot see a dentist until at least tomorrow.  I am going to give her a shot of Rocephin and shot of Toradol to help her through the day.  We will continue her penicillin.  Refilled her tramadol.  Final Clinical Impressions(s) / UC Diagnoses   Final diagnoses:  Dental abscess  Pain, dental     Discharge Instructions     Continue the penicillin I have prescribed more pain medicine in case you cannot see a dentist right away Call dental offices tomorrow am   ED Prescriptions    Medication Sig Dispense Auth. Provider   traMADol (ULTRAM) 50 MG tablet Take 2 tablets (100 mg total) by mouth every 6 (six) hours as needed. 30 tablet Eustace Moore, MD     Controlled Substance Prescriptions [place] Controlled Substance Registry consulted? Not Applicable   Eustace Moore, MD 02/08/18 2137

## 2018-02-08 NOTE — Discharge Instructions (Addendum)
Continue the penicillin I have prescribed more pain medicine in case you cannot see a dentist right away Call dental offices tomorrow am

## 2018-02-08 NOTE — ED Triage Notes (Signed)
Pt seen here yesterday for dental pain, states its gotten worse. On tramadol and antibiotics.

## 2018-02-16 MED FILL — SERTRALINE HCL 100 MG TAB: 100 | 30 days supply | Qty: 30 | Fill #3

## 2018-02-18 MED FILL — $ELIQUIS 5 MG TABLET: 5 | 30 days supply | Qty: 60 | Fill #3

## 2018-02-18 MED FILL — ?METOPROLOL 50 MG TABLET: 50 | 30 days supply | Qty: 60 | Fill #2

## 2018-02-18 MED FILL — LEVOTHYROXINE 125 MCG TAB: 125 | 30 days supply | Qty: 30 | Fill #3

## 2018-02-20 ENCOUNTER — Ambulatory Visit: Payer: Self-pay | Admitting: Family Medicine

## 2018-02-27 MED FILL — LOSARTAN POTASSIUM 50 MG TA: 50 | 30 days supply | Qty: 30 | Fill #3

## 2018-02-27 MED FILL — ?ATORVASTATIN 20 MG TABLET: 20 | 30 days supply | Qty: 30 | Fill #3

## 2018-03-13 MED FILL — SERTRALINE HCL 100 MG TAB: 100 | 30 days supply | Qty: 30 | Fill #4

## 2018-03-24 ENCOUNTER — Other Ambulatory Visit: Payer: Self-pay | Admitting: Family Medicine

## 2018-03-24 DIAGNOSIS — E039 Hypothyroidism, unspecified: Secondary | ICD-10-CM

## 2018-03-24 MED FILL — $ELIQUIS 5 MG TABLET: 5 | 30 days supply | Qty: 60 | Fill #4

## 2018-03-24 MED FILL — LEVOTHYROXINE 125 MCG TAB: 125 | 30 days supply | Qty: 30 | Fill #0

## 2018-03-24 MED FILL — ?METOPROLOL 50 MG TABLET: 50 | 30 days supply | Qty: 60 | Fill #3

## 2018-04-03 MED FILL — LOSARTAN POTASSIUM 50 MG TA: 50 | 30 days supply | Qty: 30 | Fill #4

## 2018-04-03 MED FILL — ?ATORVASTATIN 20 MG TABLET: 20 | 30 days supply | Qty: 30 | Fill #4

## 2018-04-08 DIAGNOSIS — I10 Essential (primary) hypertension: Secondary | ICD-10-CM

## 2018-04-20 ENCOUNTER — Ambulatory Visit: Payer: Self-pay | Attending: Family Medicine | Admitting: Family Medicine

## 2018-04-20 ENCOUNTER — Encounter: Payer: Self-pay | Admitting: Family Medicine

## 2018-04-20 ENCOUNTER — Encounter

## 2018-04-20 VITALS — BP 130/80 | HR 78 | Temp 98.5°F | Ht 69.0 in | Wt 183.6 lb

## 2018-04-20 DIAGNOSIS — X58XXXA Exposure to other specified factors, initial encounter: Secondary | ICD-10-CM | POA: Insufficient documentation

## 2018-04-20 DIAGNOSIS — I471 Supraventricular tachycardia: Secondary | ICD-10-CM | POA: Insufficient documentation

## 2018-04-20 DIAGNOSIS — E039 Hypothyroidism, unspecified: Secondary | ICD-10-CM

## 2018-04-20 DIAGNOSIS — F329 Major depressive disorder, single episode, unspecified: Secondary | ICD-10-CM | POA: Insufficient documentation

## 2018-04-20 DIAGNOSIS — F419 Anxiety disorder, unspecified: Secondary | ICD-10-CM | POA: Insufficient documentation

## 2018-04-20 DIAGNOSIS — Z79890 Hormone replacement therapy (postmenopausal): Secondary | ICD-10-CM | POA: Insufficient documentation

## 2018-04-20 DIAGNOSIS — Z7951 Long term (current) use of inhaled steroids: Secondary | ICD-10-CM | POA: Insufficient documentation

## 2018-04-20 DIAGNOSIS — I10 Essential (primary) hypertension: Secondary | ICD-10-CM | POA: Insufficient documentation

## 2018-04-20 DIAGNOSIS — Z1159 Encounter for screening for other viral diseases: Secondary | ICD-10-CM

## 2018-04-20 DIAGNOSIS — E7800 Pure hypercholesterolemia, unspecified: Secondary | ICD-10-CM | POA: Insufficient documentation

## 2018-04-20 DIAGNOSIS — F32A Depression, unspecified: Secondary | ICD-10-CM

## 2018-04-20 DIAGNOSIS — E038 Other specified hypothyroidism: Secondary | ICD-10-CM | POA: Insufficient documentation

## 2018-04-20 DIAGNOSIS — Z7901 Long term (current) use of anticoagulants: Secondary | ICD-10-CM | POA: Insufficient documentation

## 2018-04-20 DIAGNOSIS — F17200 Nicotine dependence, unspecified, uncomplicated: Secondary | ICD-10-CM | POA: Insufficient documentation

## 2018-04-20 DIAGNOSIS — M109 Gout, unspecified: Secondary | ICD-10-CM | POA: Insufficient documentation

## 2018-04-20 DIAGNOSIS — Z1211 Encounter for screening for malignant neoplasm of colon: Secondary | ICD-10-CM

## 2018-04-20 DIAGNOSIS — Z79899 Other long term (current) drug therapy: Secondary | ICD-10-CM | POA: Insufficient documentation

## 2018-04-20 DIAGNOSIS — J45909 Unspecified asthma, uncomplicated: Secondary | ICD-10-CM | POA: Insufficient documentation

## 2018-04-20 DIAGNOSIS — E785 Hyperlipidemia, unspecified: Secondary | ICD-10-CM | POA: Insufficient documentation

## 2018-04-20 DIAGNOSIS — S01552A Open bite of oral cavity, initial encounter: Secondary | ICD-10-CM | POA: Insufficient documentation

## 2018-04-20 MED ORDER — ATORVASTATIN CALCIUM 20 MG PO TABS: 20.0000 mg | ORAL_TABLET | Freq: Every day | ORAL | 6 refills | Status: DC

## 2018-04-20 MED ORDER — SERTRALINE HCL 100 MG PO TABS: ORAL_TABLET | ORAL | 6 refills | Status: DC

## 2018-04-20 MED ORDER — APIXABAN 5 MG PO TABS: 5.0000 mg | ORAL_TABLET | Freq: Two times a day (BID) | ORAL | 6 refills | Status: DC

## 2018-04-20 MED ORDER — METOPROLOL TARTRATE 50 MG PO TABS: 50.0000 mg | ORAL_TABLET | Freq: Two times a day (BID) | ORAL | 6 refills | Status: DC

## 2018-04-20 MED ORDER — LOSARTAN POTASSIUM 50 MG PO TABS: 50.0000 mg | ORAL_TABLET | Freq: Every day | ORAL | 6 refills | Status: DC

## 2018-04-20 NOTE — Progress Notes (Signed)
Subjective:  Patient ID: Anita Russell, female    DOB: Jul 12, 1956  Age: 62 y.o. MRN: [id number]  CC: Hypertension   HPI Anita Russell Is a 62 year old female with a history of depression and anxiety, hypothyroidism, hypertension, Hyperlipidemia, SVT, atrial flutter (status post TEE cardioversion in 05/2017) who comes into the clinic for a follow-up visit. She has pain in her left cheek as she accidentally bit her tongue and her left cheek after a dental procedure due to residual paresthesia from procedure. Endorses compliance with her antihypertensive and her statin and is also taking levothyroxine. She endorses increased anxiety due to her current living situations which are not the best but hopes to be moving out in the next few weeks when she receives her medicaid check. She recently saw her cardiologist Dr. Terrence Dupont with no recent change in her medications. She has no additional concerns today.  Past Medical History:  Diagnosis Date  . Anxiety   . Asthma   . Depression   . Gout   . Hypertension   . Hypothyroidism   . Mental disorder   . Thyroid disease     Past Surgical History:  Procedure Laterality Date  . ABDOMINAL HYSTERECTOMY    . APPENDECTOMY    . CARDIOVERSION N/A 06/16/2017   Procedure: CARDIOVERSION;  Surgeon: Dixie Dials, MD;  Location: [facility] ENDOSCOPY;  Service: Cardiovascular;  Laterality: N/A;  . TEE WITHOUT CARDIOVERSION N/A 06/16/2017   Procedure: TRANSESOPHAGEAL ECHOCARDIOGRAM (TEE);  Surgeon: Dixie Dials, MD;  Location: [facility] ENDOSCOPY;  Service: Cardiovascular;  Laterality: N/A;    Allergies  Allergen Reactions  . Hyzaar [Losartan Potassium-Hctz] Other (See Comments)    Caused patient to feel light-headed and gave her vertigo  . Meloxicam Rash    A "very bad" rash     Outpatient Medications Prior to Visit  Medication Sig Dispense Refill  . acetaminophen (TYLENOL) 500 MG tablet Take 2 tablets (1,000 mg total) by mouth every 8 (eight) hours  as needed for mild pain, moderate pain or headache. 30 tablet 0  . albuterol (PROVENTIL HFA) 108 (90 Base) MCG/ACT inhaler Inhale 2 puffs into the lungs every 6 hours as needed for shortness of breath 6.7 g 1  . amiodarone (PACERONE) 200 MG tablet Take 1 tablet (200 mg total) daily by mouth. 30 tablet 3  . colchicine 0.6 MG tablet Take 2 tablets at the start of a gout attack. Then 1 more tablet an hour later. Do not repeat this dosing for 2 days. 30 tablet 1  . hydrocortisone 2.5 % cream Apply topically 2 (two) times daily. 30 g 2  . levothyroxine (SYNTHROID, LEVOTHROID) 125 MCG tablet TAKE 1 TABLET BY MOUTH DAILY BEFORE BREAKFAST 30 tablet 2  . apixaban (ELIQUIS) 5 MG TABS tablet Take 1 tablet (5 mg total) by mouth 2 (two) times daily. 60 tablet 6  . atorvastatin (LIPITOR) 20 MG tablet Take 1 tablet (20 mg total) by mouth daily. 30 tablet 6  . losartan (COZAAR) 50 MG tablet Take 1 tablet (50 mg total) by mouth daily. 30 tablet 6  . metoprolol tartrate (LOPRESSOR) 50 MG tablet Take 1 tablet (50 mg total) by mouth 2 (two) times daily. 180 tablet 6  . sertraline (ZOLOFT) 100 MG tablet TAKE 1 TABLET BY MOUTH ONCE DAILY FOR ANXIETY AND DEPRESSION 30 tablet 6  . fluticasone (FLONASE) 50 MCG/ACT nasal spray Place 2 sprays into both nostrils daily. (Patient not taking: Reported on 04/20/2018) 16 g 6  . nicotine (NICODERM CQ - DOSED  IN MG/24 HOURS) 21 mg/24hr patch Place 1 patch (21 mg total) onto the skin daily. For 1 month then 14 mg patch daily for 1 month then 7 mg patch daily for 1 month. (Patient not taking: Reported on 04/20/2018) 90 patch 0  . predniSONE (DELTASONE) 20 MG tablet Take 3 tablets daily with breakfast for Day 1-3. Then take 2 tablets Days 4-6 and 1 tablet for Days 7-9. (Patient not taking: Reported on 04/20/2018) 18 tablet 0  . traMADol (ULTRAM) 50 MG tablet Take 2 tablets (100 mg total) by mouth every 6 (six) hours as needed. (Patient not taking: Reported on 04/20/2018) 30 tablet 0   No  facility-administered medications prior to visit.     ROS Review of Systems  Constitutional: Negative for activity change, appetite change and fatigue.  HENT: Negative for congestion, sinus pressure and sore throat.   Eyes: Negative for visual disturbance.  Respiratory: Negative for cough, chest tightness, shortness of breath and wheezing.   Cardiovascular: Negative for chest pain and palpitations.  Gastrointestinal: Negative for abdominal distention, abdominal pain and constipation.  Endocrine: Negative for polydipsia.  Genitourinary: Negative for dysuria and frequency.  Musculoskeletal: Negative for arthralgias and back pain.  Skin: Negative for rash.  Neurological: Negative for tremors, light-headedness and numbness.  Hematological: Does not bruise/bleed easily.  Psychiatric/Behavioral: Negative for agitation and behavioral problems.    Objective:  BP 130/80   Pulse 78   Temp 98.5 F (36.9 C) (Oral)   Ht 5' 9" (1.753 m)   Wt 183 lb 9.6 oz (83.3 kg)   SpO2 97%   BMI 27.11 kg/m   BP/Weight 04/20/2018 02/08/2018 6/73/4193  Systolic BP 790 240 973  Diastolic BP 80 95 90  Wt. (Lbs) 183.6 - -  BMI 27.11 - -  Some encounter information is confidential and restricted. Go to Review Flowsheets activity to see all data.      Physical Exam  Constitutional: She is oriented to Russell, place, and time. She appears well-developed and well-nourished.  Cardiovascular: Normal rate, normal heart sounds and intact distal pulses.  No murmur heard. Pulmonary/Chest: Effort normal and breath sounds normal. She has no wheezes. She has no rales. She exhibits no tenderness.  Abdominal: Soft. Bowel sounds are normal. She exhibits no distension and no mass. There is no tenderness.  Musculoskeletal: Normal range of motion.  Neurological: She is alert and oriented to Russell, place, and time.  Psychiatric: She has a normal mood and affect.    CMP Latest Ref Rng & Units 07/02/2017 06/14/2017  06/13/2017  Glucose 65 - 99 mg/dL 87 99 101(H)  BUN 8 - 27 mg/dL _0 Creatinine 0.57 - 1.00 mg/dL 0.78 0.89 0.90  Sodium 134 - 144 mmol/L 139 138 138  Potassium 3.5 - 5.2 mmol/L 4.7 4.2 3.2(L)  Chloride 96 - 106 mmol/L 100 100(L) 102  CO2 20 - 29 mmol/L _1 Calcium 8.7 - 10.3 mg/dL 9.3 9.2 8.9  Total Protein 6.5 - 8.1 g/dL - - -  Total Bilirubin 0.3 - 1.2 mg/dL - - -  Alkaline Phos 38 - 126 U/L - - -  AST 15 - 41 U/L - - -  ALT 14 - 54 U/L - - -    Lipid Panel     Component Value Date/Time   CHOL 253 (H) 06/12/2017 0418   CHOL 192 02/10/2017 0908   TRIG 208 (H) 06/12/2017 0418   HDL 72 06/12/2017 0418   HDL 66 02/10/2017  0908   CHOLHDL 3.5 06/12/2017 0418   VLDL 42 (H) 06/12/2017 0418   LDLCALC 139 (H) 06/12/2017 0418   LDLCALC 63 02/10/2017 0908     Assessment & Plan:   1. Pure hypercholesterolemia Uncontrolled We will check lipid panel and adjust regimen accordingly - atorvastatin (LIPITOR) 20 MG tablet; Take 1 tablet (20 mg total) by mouth daily.  Dispense: 30 tablet; Refill: 6  2. Screening for colon cancer Referred to GI  3. Essential hypertension Controlled Counseled on blood pressure goal of less than 130/80, low-sodium, DASH diet, medication compliance, 150 minutes of moderate intensity exercise per week. Discussed medication compliance, adverse effects. - CMP14+EGFR; Future - Lipid panel; Future - metoprolol tartrate (LOPRESSOR) 50 MG tablet; Take 1 tablet (50 mg total) by mouth 2 (two) times daily.  Dispense: 180 tablet; Refill: 6  4. Anxiety and depression Increased stress due to living situations - sertraline (ZOLOFT) 100 MG tablet; TAKE 1 TABLET BY MOUTH ONCE DAILY FOR ANXIETY AND DEPRESSION  Dispense: 30 tablet; Refill: 6  5. Acquired hypothyroidism Will adjust levothyroxine dose once thyroid labs are obtained - T4, free; Future - TSH; Future  6. Need for hepatitis C screening test - Hepatitis c antibody (reflex); Future  7.  Smoking Spent 3 minutes discussing smoking cessation and she is working on quitting  Will fax labs to her cardiologist, Dr. Terrence Dupont once labs are obtained.  Meds ordered this encounter  Medications  . apixaban (ELIQUIS) 5 MG TABS tablet    Sig: Take 1 tablet (5 mg total) by mouth 2 (two) times daily.    Dispense:  60 tablet    Refill:  6  . atorvastatin (LIPITOR) 20 MG tablet    Sig: Take 1 tablet (20 mg total) by mouth daily.    Dispense:  30 tablet    Refill:  6  . losartan (COZAAR) 50 MG tablet    Sig: Take 1 tablet (50 mg total) by mouth daily.    Dispense:  30 tablet    Refill:  6  . metoprolol tartrate (LOPRESSOR) 50 MG tablet    Sig: Take 1 tablet (50 mg total) by mouth 2 (two) times daily.    Dispense:  180 tablet    Refill:  6  . sertraline (ZOLOFT) 100 MG tablet    Sig: TAKE 1 TABLET BY MOUTH ONCE DAILY FOR ANXIETY AND DEPRESSION    Dispense:  30 tablet    Refill:  6    Follow-up: Return in about 3 months (around 07/21/2018) for Follow-up of chronic medical conditions.   Charlott Rakes MD

## 2018-04-20 NOTE — Patient Instructions (Signed)
Steps to Quit Smoking Smoking tobacco can be bad for your health. It can also affect almost every organ in your body. Smoking puts you and people around you at risk for many serious long-lasting (chronic) diseases. Quitting smoking is hard, but it is one of the best things that you can do for your health. It is never too late to quit. What are the benefits of quitting smoking? When you quit smoking, you lower your risk for getting serious diseases and conditions. They can include:  Lung cancer or lung disease.  Heart disease.  Stroke.  Heart attack.  Not being able to have children (infertility).  Weak bones (osteoporosis) and broken bones (fractures).  If you have coughing, wheezing, and shortness of breath, those symptoms may get better when you quit. You may also get sick less often. If you are pregnant, quitting smoking can help to lower your chances of having a baby of low birth weight. What can I do to help me quit smoking? Talk with your doctor about what can help you quit smoking. Some things you can do (strategies) include:  Quitting smoking totally, instead of slowly cutting back how much you smoke over a period of time.  Going to in-person counseling. You are more likely to quit if you go to many counseling sessions.  Using resources and support systems, such as: ? Online chats with a counselor. ? Phone quitlines. ? Printed self-help materials. ? Support groups or group counseling. ? Text messaging programs. ? Mobile phone apps or applications.  Taking medicines. Some of these medicines may have nicotine in them. If you are pregnant or breastfeeding, do not take any medicines to quit smoking unless your doctor says it is okay. Talk with your doctor about counseling or other things that can help you.  Talk with your doctor about using more than one strategy at the same time, such as taking medicines while you are also going to in-person counseling. This can help make  quitting easier. What things can I do to make it easier to quit? Quitting smoking might feel very hard at first, but there is a lot that you can do to make it easier. Take these steps:  Talk to your family and friends. Ask them to support and encourage you.  Call phone quitlines, reach out to support groups, or work with a counselor.  Ask people who smoke to not smoke around you.  Avoid places that make you want (trigger) to smoke, such as: ? Bars. ? Parties. ? Smoke-break areas at work.  Spend time with people who do not smoke.  Lower the stress in your life. Stress can make you want to smoke. Try these things to help your stress: ? Getting regular exercise. ? Deep-breathing exercises. ? Yoga. ? Meditating. ? Doing a body scan. To do this, close your eyes, focus on one area of your body at a time from head to toe, and notice which parts of your body are tense. Try to relax the muscles in those areas.  Download or buy apps on your mobile phone or tablet that can help you stick to your quit plan. There are many free apps, such as QuitGuide from the CDC (Centers for Disease Control and Prevention). You can find more support from smokefree.gov and other websites.  This information is not intended to replace advice given to you by your health care provider. Make sure you discuss any questions you have with your health care provider. Document Released: 06/08/2009 Document   Revised: 04/09/2016 Document Reviewed: 12/27/2014 Elsevier Interactive Patient Education  2018 Elsevier Inc.  

## 2018-04-22 ENCOUNTER — Ambulatory Visit: Payer: Self-pay | Attending: Family Medicine

## 2018-04-22 DIAGNOSIS — I10 Essential (primary) hypertension: Secondary | ICD-10-CM | POA: Insufficient documentation

## 2018-04-22 DIAGNOSIS — E039 Hypothyroidism, unspecified: Secondary | ICD-10-CM | POA: Insufficient documentation

## 2018-04-22 DIAGNOSIS — Z1159 Encounter for screening for other viral diseases: Secondary | ICD-10-CM | POA: Insufficient documentation

## 2018-04-22 NOTE — Progress Notes (Signed)
Patient here for lab visit only 

## 2018-04-23 ENCOUNTER — Telehealth: Payer: Self-pay

## 2018-04-23 ENCOUNTER — Other Ambulatory Visit: Payer: Self-pay | Admitting: Family Medicine

## 2018-04-23 DIAGNOSIS — E039 Hypothyroidism, unspecified: Secondary | ICD-10-CM

## 2018-04-23 LAB — CMP14+EGFR
A/G RATIO: 1.5 (ref 1.2–2.2)
ALK PHOS: 83 IU/L (ref 39–117)
ALT: 15 IU/L (ref 0–32)
AST: 26 IU/L (ref 0–40)
Albumin: 4.5 g/dL (ref 3.6–4.8)
BILIRUBIN TOTAL: 0.3 mg/dL (ref 0.0–1.2)
BUN/Creatinine Ratio: 15 (ref 12–28)
BUN: 15 mg/dL (ref 8–27)
CHLORIDE: 99 mmol/L (ref 96–106)
CO2: 23 mmol/L (ref 20–29)
Calcium: 9.5 mg/dL (ref 8.7–10.3)
Creatinine, Ser: 1.03 mg/dL — ABNORMAL HIGH (ref 0.57–1.00)
GFR calc Af Amer: 67 mL/min/{1.73_m2} (ref >59)
GFR calc non Af Amer: 58 mL/min/{1.73_m2} — ABNORMAL LOW (ref >59)
GLUCOSE: 90 mg/dL (ref 65–99)
Globulin, Total: 3 g/dL (ref 1.5–4.5)
POTASSIUM: 4.5 mmol/L (ref 3.5–5.2)
Sodium: 139 mmol/L (ref 134–144)
Total Protein: 7.5 g/dL (ref 6.0–8.5)

## 2018-04-23 LAB — T4, FREE: Free T4: 1.51 ng/dL (ref 0.82–1.77)

## 2018-04-23 LAB — LIPID PANEL
CHOL/HDL RATIO: 2.6 ratio (ref 0.0–4.4)
Cholesterol, Total: 215 mg/dL — ABNORMAL HIGH (ref 100–199)
HDL: 82 mg/dL (ref >39)
LDL Calculated: 90 mg/dL (ref 0–99)
TRIGLYCERIDES: 214 mg/dL — AB (ref 0–149)
VLDL CHOLESTEROL CAL: 43 mg/dL — AB (ref 5–40)

## 2018-04-23 LAB — TSH: TSH: 3.27 u[IU]/mL (ref 0.450–4.500)

## 2018-04-23 LAB — HEPATITIS C ANTIBODY (REFLEX): HCV Ab: <0.1 s/co ratio (ref 0.0–0.9)

## 2018-04-23 LAB — HCV COMMENT:

## 2018-04-23 MED ORDER — LEVOTHYROXINE SODIUM 125 MCG PO TABS: 125.0000 ug | ORAL_TABLET | Freq: Every day | ORAL | 2 refills | Status: DC

## 2018-04-23 MED FILL — SERTRALINE HCL 100 MG TAB: 100 | 30 days supply | Qty: 30 | Fill #5

## 2018-04-23 MED FILL — LEVOTHYROXINE 125 MCG TAB: 125 | 30 days supply | Qty: 30 | Fill #1

## 2018-04-23 NOTE — Telephone Encounter (Signed)
-----   Message from Hoy RegisterEnobong Newlin, MD sent at 04/23/2018  8:57 AM EDT ----- Thyroid is normal continue current dose of levothyroxine.  Total cholesterol has trended up slightly.  Please advised to adhere to a low-cholesterol diet, exercise and continue statin.  She is negative for hepatitis C.

## 2018-04-23 NOTE — Telephone Encounter (Signed)
Patient was called and a voicemail was left informing her to contact office for lab results.    If patient returns phone call please inform patient of lab results below.

## 2018-04-24 MED FILL — ?METOPROLOL 50 MG TABLET: 50 | 30 days supply | Qty: 60 | Fill #4

## 2018-04-24 MED FILL — $ELIQUIS 5 MG TABLET: 5 | 30 days supply | Qty: 60 | Fill #5

## 2018-04-28 ENCOUNTER — Telehealth: Payer: Self-pay | Admitting: Family Medicine

## 2018-04-28 NOTE — Telephone Encounter (Signed)
Patient called back regarding missed called over lab results. Please follow up with patient at [phone].

## 2018-05-06 MED FILL — LOSARTAN POTASSIUM 50 MG TA: 50 | 30 days supply | Qty: 30 | Fill #5

## 2018-05-06 MED FILL — ?ATORVASTATIN 20 MG TABLET: 20 | 30 days supply | Qty: 30 | Fill #5

## 2018-05-25 ENCOUNTER — Encounter: Payer: Self-pay | Admitting: Family Medicine

## 2018-05-26 MED FILL — LEVOTHYROXINE 125 MCG TAB: 125 | 30 days supply | Qty: 30 | Fill #2

## 2018-05-26 MED FILL — SERTRALINE HCL 100 MG TAB: 100 | 30 days supply | Qty: 30 | Fill #6

## 2018-05-28 MED FILL — ?METOPROLOL 50 MG TABLET: 50 | 30 days supply | Qty: 60 | Fill #5

## 2018-05-28 MED FILL — $ELIQUIS 5 MG TABLET: 5 | 30 days supply | Qty: 60 | Fill #6

## 2018-06-05 ENCOUNTER — Telehealth: Payer: Self-pay | Admitting: Family Medicine

## 2018-06-05 NOTE — Telephone Encounter (Signed)
Patient was called and a voicemail was left informing patient to contact office. Patient would need to come in and sign a medical release form.

## 2018-06-05 NOTE — Telephone Encounter (Signed)
Anita Russell with Dr Janalyn Shy office called requesting lab results and was informed we are unable to give those over due to no signed paperwork. Please follow up.

## 2018-06-08 MED FILL — ATORVASTATIN 20 MG TABLET: 20 | 30 days supply | Qty: 30 | Fill #6

## 2018-06-08 MED FILL — LOSARTAN POTASSIUM 50 MG TA: 50 | 30 days supply | Qty: 30 | Fill #6

## 2018-06-11 MED FILL — ALBUTEROL SULFATE HFA 108 (: 108 (90 BAS | 25 days supply | Qty: 18 | Fill #1

## 2018-06-22 ENCOUNTER — Other Ambulatory Visit: Payer: Self-pay | Admitting: Family Medicine

## 2018-06-22 DIAGNOSIS — F32A Depression, unspecified: Secondary | ICD-10-CM

## 2018-06-22 DIAGNOSIS — F419 Anxiety disorder, unspecified: Principal | ICD-10-CM

## 2018-06-22 DIAGNOSIS — F329 Major depressive disorder, single episode, unspecified: Secondary | ICD-10-CM

## 2018-06-22 DIAGNOSIS — E039 Hypothyroidism, unspecified: Secondary | ICD-10-CM

## 2018-06-23 MED FILL — LEVOTHYROXINE 125 MCG TAB: 125 | 30 days supply | Qty: 30 | Fill #0

## 2018-06-23 MED FILL — SERTRALINE HCL 100 MG TAB: 100 | 30 days supply | Qty: 30 | Fill #0

## 2018-06-26 MED FILL — METOPROLOL TARTRATE 50 MG T: 50 | 30 days supply | Qty: 60 | Fill #6

## 2018-06-26 MED FILL — $ELIQUIS 5 MG TABLET: 5 | 30 days supply | Qty: 60 | Fill #0

## 2018-07-09 ENCOUNTER — Encounter: Payer: Self-pay | Admitting: Family Medicine

## 2018-07-09 ENCOUNTER — Ambulatory Visit: Payer: Self-pay | Attending: Family Medicine | Admitting: Family Medicine

## 2018-07-09 VITALS — BP 133/77 | HR 61 | Temp 98.3°F | Ht 69.0 in | Wt 180.0 lb

## 2018-07-09 DIAGNOSIS — I4892 Unspecified atrial flutter: Secondary | ICD-10-CM | POA: Insufficient documentation

## 2018-07-09 DIAGNOSIS — Z888 Allergy status to other drugs, medicaments and biological substances status: Secondary | ICD-10-CM | POA: Insufficient documentation

## 2018-07-09 DIAGNOSIS — I483 Typical atrial flutter: Secondary | ICD-10-CM | POA: Insufficient documentation

## 2018-07-09 DIAGNOSIS — E7800 Pure hypercholesterolemia, unspecified: Secondary | ICD-10-CM | POA: Insufficient documentation

## 2018-07-09 DIAGNOSIS — F329 Major depressive disorder, single episode, unspecified: Secondary | ICD-10-CM | POA: Insufficient documentation

## 2018-07-09 DIAGNOSIS — Z23 Encounter for immunization: Secondary | ICD-10-CM

## 2018-07-09 DIAGNOSIS — E039 Hypothyroidism, unspecified: Secondary | ICD-10-CM | POA: Insufficient documentation

## 2018-07-09 DIAGNOSIS — F32A Depression, unspecified: Secondary | ICD-10-CM

## 2018-07-09 DIAGNOSIS — Z79899 Other long term (current) drug therapy: Secondary | ICD-10-CM | POA: Insufficient documentation

## 2018-07-09 DIAGNOSIS — I471 Supraventricular tachycardia: Secondary | ICD-10-CM | POA: Insufficient documentation

## 2018-07-09 DIAGNOSIS — F419 Anxiety disorder, unspecified: Secondary | ICD-10-CM | POA: Insufficient documentation

## 2018-07-09 DIAGNOSIS — J45909 Unspecified asthma, uncomplicated: Secondary | ICD-10-CM | POA: Insufficient documentation

## 2018-07-09 DIAGNOSIS — M109 Gout, unspecified: Secondary | ICD-10-CM | POA: Insufficient documentation

## 2018-07-09 DIAGNOSIS — Z7901 Long term (current) use of anticoagulants: Secondary | ICD-10-CM | POA: Insufficient documentation

## 2018-07-09 DIAGNOSIS — I10 Essential (primary) hypertension: Secondary | ICD-10-CM | POA: Insufficient documentation

## 2018-07-09 DIAGNOSIS — Z79890 Hormone replacement therapy (postmenopausal): Secondary | ICD-10-CM | POA: Insufficient documentation

## 2018-07-09 MED ORDER — LEVOTHYROXINE SODIUM 125 MCG PO TABS: 125.0000 ug | ORAL_TABLET | Freq: Every day | ORAL | 3 refills | Status: DC

## 2018-07-09 NOTE — Patient Instructions (Signed)

## 2018-07-09 NOTE — Progress Notes (Signed)
Subjective:  Patient ID: Anita Russell, female    DOB: 12/31/1955  Age: 62 y.o. MRN: [id number]  CC: Hypertension   HPI Anita BruinsMaryanne Gallentine  is a 62 year old female with a history of depression and anxiety, hypothyroidism, hypertension, Hyperlipidemia, SVT, atrial flutter (status post TEE cardioversion in 05/2017) who comes into the clinic for a follow-up visit. At her last visit Zoloft had been increased due to complaints of anxiety and depression which arise from her poor living situations as she has to live with multiple other adults in the same house and had been hoping to move out but has been unable to secure the funds to do so.  She is delighted she now has a car and at least can drive away for some calm.  She denies suicidal ideations or intents and is open to speaking with a therapist today. She is compliant with her statin and her antihypertensives and denies adverse effect and has been compliant with levothyroxine. Had referred her for colonoscopy however referral was closed due to inability to contact her.  She would like to hold off at this time until she secures a phone number so she can be contacted to schedule an appointment.  Past Medical History:  Diagnosis Date  . Anxiety   . Asthma   . Depression   . Gout   . Hypertension   . Hypothyroidism   . Mental disorder   . Thyroid disease     Past Surgical History:  Procedure Laterality Date  . ABDOMINAL HYSTERECTOMY    . APPENDECTOMY    . CARDIOVERSION N/A 06/16/2017   Procedure: CARDIOVERSION;  Surgeon: Orpah CobbKadakia, Ajay, MD;  [facility];  Service: Cardiovascular;  Laterality: N/A;  . TEE WITHOUT CARDIOVERSION N/A 06/16/2017   Procedure: TRANSESOPHAGEAL ECHOCARDIOGRAM (TEE);  Surgeon: Orpah CobbKadakia, Ajay, MD;  [facility];  Service: Cardiovascular;  Laterality: N/A;    Allergies  Allergen Reactions  . Hyzaar [Losartan Potassium-Hctz] Other (See Comments)    Caused patient to feel light-headed and  gave her vertigo  . Meloxicam Rash    A "very bad" rash     Outpatient Medications Prior to Visit  Medication Sig Dispense Refill  . acetaminophen (TYLENOL) 500 MG tablet Take 2 tablets (1,000 mg total) by mouth every 8 (eight) hours as needed for mild pain, moderate pain or headache. 30 tablet 0  . albuterol (PROVENTIL HFA) 108 (90 Base) MCG/ACT inhaler Inhale 2 puffs into the lungs every 6 hours as needed for shortness of breath 6.7 g 1  . amiodarone (PACERONE) 200 MG tablet Take 1 tablet (200 mg total) daily by mouth. 30 tablet 3  . apixaban (ELIQUIS) 5 MG TABS tablet Take 1 tablet (5 mg total) by mouth 2 (two) times daily. 60 tablet 6  . atorvastatin (LIPITOR) 20 MG tablet Take 1 tablet (20 mg total) by mouth daily. 30 tablet 6  . colchicine 0.6 MG tablet Take 2 tablets at the start of a gout attack. Then 1 more tablet an hour later. Do not repeat this dosing for 2 days. 30 tablet 1  . hydrocortisone 2.5 % cream Apply topically 2 (two) times daily. 30 g 2  . losartan (COZAAR) 50 MG tablet Take 1 tablet (50 mg total) by mouth daily. 30 tablet 6  . metoprolol tartrate (LOPRESSOR) 50 MG tablet Take 1 tablet (50 mg total) by mouth 2 (two) times daily. 180 tablet 6  . sertraline (ZOLOFT) 100 MG tablet TAKE 1 TABLET BY MOUTH ONCE DAILY FOR ANXIETY  AND DEPRESSION 30 tablet 6  . levothyroxine (SYNTHROID, LEVOTHROID) 125 MCG tablet Take 1 tablet (125 mcg total) by mouth daily before breakfast. 30 tablet 2  . fluticasone (FLONASE) 50 MCG/ACT nasal spray Place 2 sprays into both nostrils daily. (Patient not taking: Reported on 04/20/2018) 16 g 6  . nicotine (NICODERM CQ - DOSED IN MG/24 HOURS) 21 mg/24hr patch Place 1 patch (21 mg total) onto the skin daily. For 1 month then 14 mg patch daily for 1 month then 7 mg patch daily for 1 month. (Patient not taking: Reported on 04/20/2018) 90 patch 0  . predniSONE (DELTASONE) 20 MG tablet Take 3 tablets daily with breakfast for Day 1-3. Then take 2 tablets Days  4-6 and 1 tablet for Days 7-9. (Patient not taking: Reported on 04/20/2018) 18 tablet 0  . traMADol (ULTRAM) 50 MG tablet Take 2 tablets (100 mg total) by mouth every 6 (six) hours as needed. (Patient not taking: Reported on 04/20/2018) 30 tablet 0   No facility-administered medications prior to visit.     ROS Review of Systems  Constitutional: Negative for activity change, appetite change and fatigue.  HENT: Negative for congestion, sinus pressure and sore throat.   Eyes: Negative for visual disturbance.  Respiratory: Negative for cough, chest tightness, shortness of breath and wheezing.   Cardiovascular: Negative for chest pain and palpitations.  Gastrointestinal: Negative for abdominal distention, abdominal pain and constipation.  Endocrine: Negative for polydipsia.  Genitourinary: Negative for dysuria and frequency.  Musculoskeletal: Negative for arthralgias and back pain.  Skin: Negative for rash.  Neurological: Negative for tremors, light-headedness and numbness.  Hematological: Does not bruise/bleed easily.  Psychiatric/Behavioral: Negative for agitation and behavioral problems.    Objective:  BP 133/77   Pulse 61   Temp 98.3 F (36.8 C) (Oral)   Ht 5\' 9"  (1.753 m)   Wt 180 lb (81.6 kg)   SpO2 98%   BMI 26.58 kg/m   BP/Weight 07/09/2018 04/20/2018 02/08/2018  Systolic BP 133 130 130  Diastolic BP 77 80 95  Wt. (Lbs) 180 183.6 -  BMI 26.58 27.11 -  Some encounter information is confidential and restricted. Go to Review Flowsheets activity to see all data.      Physical Exam  Constitutional: She is oriented to person, place, and time. She appears well-developed and well-nourished.  Cardiovascular: Normal rate, normal heart sounds and intact distal pulses.  No murmur heard. Pulmonary/Chest: Effort normal and breath sounds normal. She has no wheezes. She has no rales. She exhibits no tenderness.  Abdominal: Soft. Bowel sounds are normal. She exhibits no distension and  no mass. There is no tenderness.  Musculoskeletal: Normal range of motion.  Neurological: She is alert and oriented to person, place, and time.  Skin: Skin is warm and dry.  Psychiatric: She has a normal mood and affect.    CMP Latest Ref Rng & Units 04/22/2018 07/02/2017 06/14/2017  Glucose 65 - 99 mg/dL 90 87 99  BUN 8 - 27 mg/dL 15 12 12   Creatinine 0.57 - 1.00 mg/dL 1.61(W) 9.60 4.54  Sodium 134 - 144 mmol/L 139 139 138  Potassium 3.5 - 5.2 mmol/L 4.5 4.7 4.2  Chloride 96 - 106 mmol/L 99 100 100(L)  CO2 20 - 29 mmol/L 23 27 29   Calcium 8.7 - 10.3 mg/dL 9.5 9.3 9.2  Total Protein 6.0 - 8.5 g/dL 7.5 - -  Total Bilirubin 0.0 - 1.2 mg/dL 0.3 - -  Alkaline Phos 39 - 117 IU/L 83 - -  AST 0 - 40 IU/L 26 - -  ALT 0 - 32 IU/L 15 - -    Lipid Panel     Component Value Date/Time   CHOL 215 (H) 04/22/2018 0922   TRIG 214 (H) 04/22/2018 0922   HDL 82 04/22/2018 0922   CHOLHDL 2.6 04/22/2018 0922   CHOLHDL 3.5 06/12/2017 0418   VLDL 42 (H) 06/12/2017 0418   LDLCALC 90 04/22/2018 0922    Lab Results  Component Value Date   TSH 3.270 04/22/2018    Assessment & Plan:   1. Acquired hypothyroidism Controlled - levothyroxine (SYNTHROID, LEVOTHROID) 125 MCG tablet; Take 1 tablet (125 mcg total) by mouth daily before breakfast.  Dispense: 30 tablet; Refill: 3  2. Pure hypercholesterolemia Slightly uncontrolled as goal LDL is less than 70 Lifestyle changes Low-cholesterol diet  3. Anxiety and depression Uncontrolled due to underlying stressors from current living situation She will benefit from therapy and has been placed on the LCSW schedule Zoloft dose had been raised at her last visit; continue this.  4. Hypertension, unspecified type Controlled Continue medications Counseled on blood pressure goal of less than 130/80, low-sodium, DASH diet, medication compliance, 150 minutes of moderate intensity exercise per week. Discussed medication compliance, adverse effects.   5.  Typical atrial flutter (HCC) Status post TEE cardioversion in 05/2017 Currently on amiodarone and Eliquis Followed by cardiology  6. Need for immunization against influenza - Flu Vaccine QUAD 36+ mos IM   Meds ordered this encounter  Medications  . levothyroxine (SYNTHROID, LEVOTHROID) 125 MCG tablet    Sig: Take 1 tablet (125 mcg total) by mouth daily before breakfast.    Dispense:  30 tablet    Refill:  3    Follow-up: Return in about 3 months (around 10/09/2018) for Follow-up of chronic medical conditions.   Hoy Register MD

## 2018-07-13 MED FILL — LOSARTAN POTASSIUM 50 MG TA: 50 | 30 days supply | Qty: 30 | Fill #0

## 2018-07-13 MED FILL — ATORVASTATIN 20 MG TABLET: 20 | 30 days supply | Qty: 30 | Fill #0

## 2018-07-14 ENCOUNTER — Ambulatory Visit: Payer: Self-pay | Attending: Family Medicine | Admitting: Licensed Clinical Social Worker

## 2018-07-14 ENCOUNTER — Ambulatory Visit: Payer: Self-pay

## 2018-07-14 DIAGNOSIS — F419 Anxiety disorder, unspecified: Secondary | ICD-10-CM

## 2018-07-14 DIAGNOSIS — Z599 Problem related to housing and economic circumstances, unspecified: Secondary | ICD-10-CM

## 2018-07-15 NOTE — BH Specialist Note (Signed)
Integrated Behavioral Health Initial Visit  MRN: [id number] Name: Anita Russell  Number of Integrated Behavioral Health Clinician visits:: 1/6 Session Start time: 10:55 AM  Session End time: 11:20 AM Total time: 25 minutes  Type of Service: Integrated Behavioral Health- Individual/Family Interpretor:No. Interpretor Name and Language: N/A   SUBJECTIVE: Anita Russell is a 62 y.o. female accompanied by self Patient was referred by self for housing resources. Patient reports the following symptoms/concerns: Pt states that she has been residing with a friend for the last year. Recently, his adult son moved in with additional pets. Pt reports an increase in her anxiety and is requesting assistance with obtaining independent housing. Duration of problem: 1 year; Severity of problem: moderate  OBJECTIVE: Mood: Anxious and Affect: Appropriate Risk of harm to self or others: No plan to harm self or others  LIFE CONTEXT: Family and Social: Pt receives emotional support from friends and adult daughter School/Work: Pt was recently approved for disability. She also receives food stamps.  Self-Care: Pt plays games on the phone to cope with stressors Life Changes: Pt states that she has been residing with a friend for the last year. Recently, his adult son moved in with additional pets. Pt reports an increase in her anxiety and is requesting assistance with obtaining independent housing.   GOALS ADDRESSED: Patient will: 1. Reduce symptoms of: anxiety 2. Increase knowledge and/or ability of: coping skills and healthy habits  3. Demonstrate ability to: Increase healthy adjustment to current life circumstances and Increase adequate support systems for patient/family  INTERVENTIONS: Interventions utilized: Mindfulness or Management consultantelaxation Training, Supportive Counseling and Link to [employer]  Standardized Assessments completed: Not Needed  ASSESSMENT: Patient currently experiencing  anxiety triggered by the need to obtain independent housing. Pt states that she has been residing with a friend for the last year. Recently, his adult son moved in with additional pets. Pt reports an increase in her anxiety.   Patient may benefit from linkage to community resources. LCSWA discussed therapeutic strategies to assist with managing anxiety and decreasing symptoms. Pt was referred to [employer]. LCSWA showed pt how to utilize their online forum to search for affordable housing. Pt was appreciative for the assistance.  PLAN: 1. Follow up with behavioral health clinician on : Pt was encouraged to contact LCSWA if symptoms worsen or fail to improve to [facility] 2. Behavioral recommendations: LCSWA recommends that pt apply healthy coping skills discussed and utilize provided resources. Pt is encouraged to schedule follow up appointment with LCSWA 3. Referral(s): Integrated Art gallery managerBehavioral Health Services (In Clinic) and [employer]:  Housing 4. "From scale of 1-10, how likely are you to follow plan?":   Bridgett LarssonJasmine D Kameo Bains, LCSW 07/17/18 2:42 PM

## 2018-07-16 MED FILL — AMIODARONE HCL 200 MG TAB: 200 | 32 days supply | Qty: 8 | Fill #0

## 2018-07-27 MED FILL — METOPROLOL TARTRATE 50 MG T: 50 | 30 days supply | Qty: 60 | Fill #7

## 2018-07-27 MED FILL — SERTRALINE HCL 100 MG TAB: 100 | 30 days supply | Qty: 30 | Fill #1

## 2018-07-27 MED FILL — $ELIQUIS 5 MG TABLET: 5 | 90 days supply | Qty: 180 | Fill #1

## 2018-07-27 MED FILL — LEVOTHYROXINE 125 MCG TAB: 125 | 30 days supply | Qty: 30 | Fill #1

## 2018-08-10 MED FILL — LOSARTAN POTASSIUM 50 MG TA: 50 | 30 days supply | Qty: 30 | Fill #1

## 2018-08-10 MED FILL — ATORVASTATIN 20 MG TABLET: 20 | 30 days supply | Qty: 30 | Fill #1

## 2018-08-27 MED FILL — ?METOPROLOL 50 MG TABLET: 50 | 30 days supply | Qty: 60 | Fill #8

## 2018-08-27 MED FILL — LEVOTHYROXINE 125 MCG TAB: 125 | 30 days supply | Qty: 30 | Fill #2

## 2018-09-04 ENCOUNTER — Telehealth: Payer: Self-pay | Admitting: Family Medicine

## 2018-09-04 MED FILL — SERTRALINE HCL 100 MG TAB: 100 | 30 days supply | Qty: 30 | Fill #2

## 2018-09-04 NOTE — Telephone Encounter (Addendum)
Pt brought script for labs [facility] / [facility]  Ph [phone]  Fax [phone].   Pt states if we have the labs already we can fax them and if not then please schedule lab appt then fax results to Dr Sharyn LullHarwani.  Placed order in provider's box for review.

## 2018-09-07 NOTE — Telephone Encounter (Signed)
Please advise RMA on if this concern still needs to be addressed and scheduled.

## 2018-09-07 NOTE — Telephone Encounter (Signed)
Could you please fax labs from 03/2018 to Dr. Sharyn Lull?  Thank you

## 2018-09-10 MED FILL — AMIODARONE HCL 200 MG TAB: 200 | 32 days supply | Qty: 8 | Fill #1

## 2018-09-10 MED FILL — ?ATORVASTATIN 20 MG TABLET: 20 | 30 days supply | Qty: 30 | Fill #2

## 2018-09-10 MED FILL — LOSARTAN POTASSIUM 50 MG TA: 50 | 30 days supply | Qty: 30 | Fill #2

## 2018-09-15 NOTE — Telephone Encounter (Signed)
Call has been placed to patient twice. Patient was left a message to schedule a lab appointment.

## 2018-09-16 ENCOUNTER — Ambulatory Visit: Payer: Self-pay | Attending: Family Medicine

## 2018-09-28 ENCOUNTER — Other Ambulatory Visit: Payer: Self-pay

## 2018-09-28 ENCOUNTER — Encounter (HOSPITAL_COMMUNITY): Payer: Self-pay | Admitting: *Deleted

## 2018-09-28 ENCOUNTER — Emergency Department (HOSPITAL_COMMUNITY): Payer: Self-pay

## 2018-09-28 ENCOUNTER — Emergency Department (HOSPITAL_COMMUNITY)
Admission: EM | Admit: 2018-09-28 | Discharge: 2018-09-28 | Disposition: A | Discharge status: Home / Self Care | Payer: Self-pay | Attending: Emergency Medicine | Admitting: Emergency Medicine

## 2018-09-28 DIAGNOSIS — R0602 Shortness of breath: Secondary | ICD-10-CM | POA: Insufficient documentation

## 2018-09-28 DIAGNOSIS — Z5321 Procedure and treatment not carried out due to patient leaving prior to being seen by health care provider: Secondary | ICD-10-CM | POA: Insufficient documentation

## 2018-09-28 MED ORDER — ALBUTEROL SULFATE (2.5 MG/3ML) 0.083% IN NEBU: 5.0000 mg | INHALATION_SOLUTION | Freq: Once | RESPIRATORY_TRACT | Status: DC

## 2018-09-28 MED FILL — LEVOTHYROXINE 125 MCG TAB: 125 | 30 days supply | Qty: 30 | Fill #0

## 2018-09-28 MED FILL — ?METOPROLOL 50 MG TABLET: 50 | 30 days supply | Qty: 60 | Fill #9

## 2018-09-28 NOTE — ED Notes (Signed)
Pt states she can not wait any longer and is leaving.  

## 2018-09-28 NOTE — ED Triage Notes (Signed)
Pt endorses severe sob with productive cough x 3-4 days.  Started to cough up "mucus" today.  States she was coughing so hard that it was "choking me."  She is a smoker and drinks 26 oz of beer "several days" a week.

## 2018-09-29 ENCOUNTER — Encounter (HOSPITAL_COMMUNITY): Payer: Self-pay

## 2018-09-29 ENCOUNTER — Ambulatory Visit (HOSPITAL_COMMUNITY)
Admission: EM | Admit: 2018-09-29 | Discharge: 2018-09-29 | Disposition: A | Discharge status: Home / Self Care | Payer: Self-pay | Attending: Family Medicine | Admitting: Family Medicine

## 2018-09-29 DIAGNOSIS — J441 Chronic obstructive pulmonary disease with (acute) exacerbation: Secondary | ICD-10-CM

## 2018-09-29 MED ORDER — ALBUTEROL SULFATE HFA 108 (90 BASE) MCG/ACT IN AERS: 1.0000 | INHALATION_SPRAY | Freq: Four times a day (QID) | RESPIRATORY_TRACT | 0 refills | Status: DC | PRN

## 2018-09-29 MED ORDER — IPRATROPIUM-ALBUTEROL 0.5-2.5 (3) MG/3ML IN SOLN
3.0000 mL | Freq: Once | RESPIRATORY_TRACT | Status: AC
  Administered 2018-09-29: 3 mL via RESPIRATORY_TRACT

## 2018-09-29 MED ORDER — BENZONATATE 200 MG PO CAPS: 200.0000 mg | ORAL_CAPSULE | Freq: Three times a day (TID) | ORAL | 0 refills | Status: AC | PRN

## 2018-09-29 MED ORDER — PREDNISONE 50 MG PO TABS: 50.0000 mg | ORAL_TABLET | Freq: Every day | ORAL | 0 refills | Status: AC

## 2018-09-29 MED ORDER — DOXYCYCLINE HYCLATE 100 MG PO CAPS: 100.0000 mg | ORAL_CAPSULE | Freq: Two times a day (BID) | ORAL | 0 refills | Status: AC

## 2018-09-29 MED ORDER — IPRATROPIUM-ALBUTEROL 0.5-2.5 (3) MG/3ML IN SOLN
RESPIRATORY_TRACT | Status: AC
  Filled 2018-09-29: qty 3

## 2018-09-29 MED FILL — BENZONATATE 100 MG CAP: 100 | 7 days supply | Qty: 56 | Fill #0

## 2018-09-29 MED FILL — predniSONE 10 MG TABS: 10 | 5 days supply | Qty: 25 | Fill #0

## 2018-09-29 MED FILL — !VENTOLIN HFA INHALER: 108 (90 BAS | 25 days supply | Qty: 1 | Fill #0

## 2018-09-29 MED FILL — DOXYCYCLINE HYCLATE 100 MG: 100 | 7 days supply | Qty: 14 | Fill #0

## 2018-09-29 NOTE — ED Triage Notes (Signed)
Pt presents with non productive cough X 6 days and severe shortness of breath.  Pt states she has a history of bronchitis.

## 2018-09-29 NOTE — Discharge Instructions (Signed)
We gave you a breathing treatment before you left Please use albuterol inhaler as needed for shortness of breath and wheezing, chest tightness Begin prednisone daily for the next 5 days, please take with food Use Tessalon as needed for cough every 8 hours Please take doxycycline twice daily for the next week. May take Tylenol or acetaminophen for body aches, headache.  Since you are on Eliquis please do not take any further ibuprofen, Motrin or Advil or Aleve.  Please continue to monitor your breathing, follow-up if symptoms worsening, developing increased chest discomfort, fevers, persistent symptoms

## 2018-09-30 NOTE — ED Provider Notes (Signed)
[facility]    CSN: [id number] Arrival date & time: 09/29/18  1423     History   Chief Complaint Chief Complaint  Patient presents with  . Cough  . Shortness of Breath    HPI Anita Russell is a 63 y.o. female history of asthma, tobacco use, hypertension, presenting today for evaluation of a cough.  Patient states that she has had a cough for the past 6 days.  She has had increased shortness of breath with this.  States that often the wheezing will come and go.  He is also had associated rhinorrhea and sore throat.  She is tried taking an expectorant, and antihistamine and ibuprofen with minimal relief.  Her main concern is that she is having difficulty going from point A to point B without developing shortness of breath.  She denies any fevers.  HPI  Past Medical History:  Diagnosis Date  . Anxiety   . Asthma   . Depression   . Gout   . Hypertension   . Hypothyroidism   . Mental disorder   . Thyroid disease     Patient Active Problem List   Diagnosis Date Noted  . Atrial flutter (HCC) 07/09/2018  . SVT (supraventricular tachycardia) (HCC) 06/11/2017  . Cervical high risk HPV (human papillomavirus) test positive 04/01/2017  . Asthma 02/04/2017  . Hyperlipidemia 07/05/2016  . Loss of weight 07/13/2015  . Chronic RUQ pain 07/13/2015  . Diarrhea 07/13/2015  . Smoking 04/08/2014  . IFG (impaired fasting glucose) 04/08/2014  . Arthritis 10/29/2013  . Anxiety and depression 10/29/2013  . Periodic health assessment, general screening, adult 10/29/2013  . Tinnitus 10/29/2013  . Anxiety 06/17/2012  . Alcohol abuse 06/14/2012  . Gout 06/14/2012  . Hypertension 06/14/2012  . Hypothyroidism 06/14/2012    Past Surgical History:  Procedure Laterality Date  . ABDOMINAL HYSTERECTOMY    . APPENDECTOMY    . CARDIOVERSION N/A 06/16/2017   Procedure: CARDIOVERSION;  Surgeon: Orpah CobbKadakia, Ajay, MD;  Location: [facility];  Service: Cardiovascular;  Laterality: N/A;   . TEE WITHOUT CARDIOVERSION N/A 06/16/2017   Procedure: TRANSESOPHAGEAL ECHOCARDIOGRAM (TEE);  Surgeon: Orpah CobbKadakia, Ajay, MD;  [facility];  Service: Cardiovascular;  Laterality: N/A;    OB History    Gravida  2   Para      Term      Preterm      AB  1   Living  1     SAB      TAB      Ectopic      Multiple      Live Births  1            Home Medications    Prior to Admission medications   Medication Sig Start Date End Date Taking? Authorizing Provider  acetaminophen (TYLENOL) 500 MG tablet Take 2 tablets (1,000 mg total) by mouth every 8 (eight) hours as needed for mild pain, moderate pain or headache. 02/07/18   Georgetta HaberBurky, Natalie B, NP  albuterol (PROVENTIL HFA;VENTOLIN HFA) 108 (90 Base) MCG/ACT inhaler Inhale 1-2 puffs into the lungs every 6 (six) hours as needed for wheezing or shortness of breath. 09/29/18   Charolett Yarrow C, PA-C  amiodarone (PACERONE) 200 MG tablet Take 1 tablet (200 mg total) daily by mouth. 07/02/17   Anders SimmondsMcClung, Angela M, PA-C  apixaban (ELIQUIS) 5 MG TABS tablet Take 1 tablet (5 mg total) by mouth 2 (two) times daily. 04/20/18   Hoy RegisterNewlin, [name], MD  atorvastatin (LIPITOR)  20 MG tablet Take 1 tablet (20 mg total) by mouth daily. 04/20/18   Hoy RegisterNewlin, [name], MD  benzonatate (TESSALON) 200 MG capsule Take 1 capsule (200 mg total) by mouth 3 (three) times daily as needed for up to 7 days for cough. 09/29/18 10/06/18  Julieanne Hadsall C, PA-C  colchicine 0.6 MG tablet Take 2 tablets at the start of a gout attack. Then 1 more tablet an hour later. Do not repeat this dosing for 2 days. 12/03/17   Wallis BambergMani, Mario, PA-C  doxycycline (VIBRAMYCIN) 100 MG capsule Take 1 capsule (100 mg total) by mouth 2 (two) times daily for 7 days. 09/29/18 10/06/18  Natalyn Szymanowski C, PA-C  fluticasone (FLONASE) 50 MCG/ACT nasal spray Place 2 sprays into both nostrils daily. Patient not taking: Reported on 04/20/2018 11/14/17   Hoy RegisterNewlin, [name], MD  hydrocortisone 2.5 % cream Apply  topically 2 (two) times daily. 03/11/17   Hoy RegisterNewlin, [name], MD  levothyroxine (SYNTHROID, LEVOTHROID) 125 MCG tablet Take 1 tablet (125 mcg total) by mouth daily before breakfast. 07/09/18   Hoy RegisterNewlin, [name], MD  losartan (COZAAR) 50 MG tablet Take 1 tablet (50 mg total) by mouth daily. 04/20/18 04/20/19  Hoy RegisterNewlin, [name], MD  metoprolol tartrate (LOPRESSOR) 50 MG tablet Take 1 tablet (50 mg total) by mouth 2 (two) times daily. 04/20/18   Hoy RegisterNewlin, [name], MD  nicotine (NICODERM CQ - DOSED IN MG/24 HOURS) 21 mg/24hr patch Place 1 patch (21 mg total) onto the skin daily. For 1 month then 14 mg patch daily for 1 month then 7 mg patch daily for 1 month. Patient not taking: Reported on 04/20/2018 11/14/17   Hoy RegisterNewlin, [name], MD  predniSONE (DELTASONE) 50 MG tablet Take 1 tablet (50 mg total) by mouth daily for 5 days. 09/29/18 10/04/18  Joanmarie Tsang C, PA-C  sertraline (ZOLOFT) 100 MG tablet TAKE 1 TABLET BY MOUTH ONCE DAILY FOR ANXIETY AND DEPRESSION 04/20/18   Hoy RegisterNewlin, [name], MD    Family History Family History  Problem Relation Age of Onset  . Arthritis Father   . Hypertension Father   . Gout Father   . Hypertension Mother     Social History Social History   Tobacco Use  . Smoking status: Current Every Day Smoker    Packs/day: 1.00    Types: Cigarettes  . Smokeless tobacco: Never Used  Substance Use Topics  . Alcohol use: No    Alcohol/week: 4.0 - 7.0 standard drinks    Types: 1 - 3 Glasses of wine, 3 - 4 Cans of beer per week    Frequency: Never    Comment: 3 times weekly  . Drug use: No     Allergies   Hyzaar [losartan potassium-hctz] and Meloxicam   Review of Systems Review of Systems  Constitutional: Negative for activity change, appetite change, chills, fatigue and fever.  HENT: Positive for congestion, rhinorrhea and sore throat. Negative for ear pain, sinus pressure and trouble swallowing.   Eyes: Negative for discharge and redness.  Respiratory: Positive for cough, chest  tightness, shortness of breath and wheezing.   Cardiovascular: Negative for chest pain.  Gastrointestinal: Negative for abdominal pain, diarrhea, nausea and vomiting.  Musculoskeletal: Negative for myalgias.  Skin: Negative for rash.  Neurological: Negative for dizziness, light-headedness and headaches.     Physical Exam Triage Vital Signs ED Triage Vitals [09/29/18 1539]  Enc Vitals Group     BP (!) 158/99     Pulse Rate 85     Resp 16  Temp 98.8 F (37.1 C)     Temp Source Oral     SpO2 99 %     Weight      Height      Head Circumference      Peak Flow      Pain Score 7     Pain Loc      Pain Edu?      Excl. in GC?    No data found.  Updated Vital Signs BP (!) 158/99 (BP Location: Right Arm)   Pulse 85   Temp 98.8 F (37.1 C) (Oral)   Resp 16   SpO2 99%   Visual Acuity Right Eye Distance:   Left Eye Distance:   Bilateral Distance:    Right Eye Near:   Left Eye Near:    Bilateral Near:     Physical Exam Vitals signs and nursing note reviewed.  Constitutional:      General: She is not in acute distress.    Appearance: She is well-developed.  HENT:     Head: Normocephalic and atraumatic.     Ears:     Comments: Bilateral ears without tenderness to palpation of external auricle, tragus and mastoid, EAC's without erythema or swelling, TM's with good bony landmarks and cone of light. Non erythematous.    Mouth/Throat:     Comments: Oral mucosa pink and moist, no tonsillar enlargement or exudate. Posterior pharynx patent and nonerythematous, no uvula deviation or swelling. Normal phonation.  Eyes:     Conjunctiva/sclera: Conjunctivae normal.  Neck:     Musculoskeletal: Neck supple.  Cardiovascular:     Rate and Rhythm: Normal rate and regular rhythm.     Heart sounds: No murmur.  Pulmonary:     Effort: Pulmonary effort is normal. No respiratory distress.     Breath sounds: Wheezing and rhonchi present.     Comments: Expiratory and expiratory  wheezing and rhonchi throughout bilateral lung fields No accessory muscle use Abdominal:     Palpations: Abdomen is soft.     Tenderness: There is no abdominal tenderness.  Skin:    General: Skin is warm and dry.  Neurological:     Mental Status: She is alert.      UC Treatments / Results  Labs (all labs ordered are listed, but only abnormal results are displayed) Labs Reviewed - No data to display  EKG None  Radiology Dg Chest 2 View  Result Date: 09/28/2018 CLINICAL DATA:  Shortness of breath EXAM: CHEST - 2 VIEW COMPARISON:  06/11/2017 FINDINGS: There is no focal parenchymal opacity. There is no pleural effusion or pneumothorax. The heart and mediastinal contours are unremarkable. There is a large hiatal hernia. The osseous structures are unremarkable. IMPRESSION: No active cardiopulmonary disease. Electronically Signed   By: Elige Ko   On: 09/28/2018 19:57    Procedures Procedures (including critical care time)  Medications Ordered in UC Medications  ipratropium-albuterol (DUONEB) 0.5-2.5 (3) MG/3ML nebulizer solution 3 mL (3 mLs Nebulization Given 09/29/18 1611)    Initial Impression / Assessment and Plan / UC Course  I have reviewed the triage vital signs and the nursing notes.  Pertinent labs & imaging results that were available during my care of the patient were reviewed by me and considered in my medical decision making (see chart for details).     Patient had recent chest x-ray negative for pneumonia, most likely COPD exacerbation, DuoNeb provided in clinic prior to discharge, albuterol inhaler prescribed along with prednisone, Tessalon.  Opted to initiate antibiotic therapy with doxycycline given patient's comorbidities.  Advised patient to continue to monitor breathing, temperature and symptoms,Discussed strict return precautions. Patient verbalized understanding and is agreeable with plan.  Final Clinical Impressions(s) / UC Diagnoses   Final diagnoses:    COPD exacerbation (HCC)     Discharge Instructions     We gave you a breathing treatment before you left Please use albuterol inhaler as needed for shortness of breath and wheezing, chest tightness Begin prednisone daily for the next 5 days, please take with food Use Tessalon as needed for cough every 8 hours Please take doxycycline twice daily for the next week. May take Tylenol or acetaminophen for body aches, headache.  Since you are on Eliquis please do not take any further ibuprofen, Motrin or Advil or Aleve.  Please continue to monitor your breathing, follow-up if symptoms worsening, developing increased chest discomfort, fevers, persistent symptoms   ED Prescriptions    Medication Sig Dispense Auth. Provider   albuterol (PROVENTIL HFA;VENTOLIN HFA) 108 (90 Base) MCG/ACT inhaler Inhale 1-2 puffs into the lungs every 6 (six) hours as needed for wheezing or shortness of breath. 1 Inhaler Floriene Jeschke C, PA-C   predniSONE (DELTASONE) 50 MG tablet Take 1 tablet (50 mg total) by mouth daily for 5 days. 5 tablet Axell Trigueros C, PA-C   benzonatate (TESSALON) 200 MG capsule Take 1 capsule (200 mg total) by mouth 3 (three) times daily as needed for up to 7 days for cough. 28 capsule Srah Ake C, PA-C   doxycycline (VIBRAMYCIN) 100 MG capsule Take 1 capsule (100 mg total) by mouth 2 (two) times daily for 7 days. 14 capsule Brelynn Wheller C, PA-C     Controlled Substance Prescriptions [place] Controlled Substance Registry consulted? Not Applicable   Lew Dawes, [place] 09/30/18 1025

## 2018-10-12 ENCOUNTER — Ambulatory Visit: Payer: Self-pay | Attending: Family Medicine | Admitting: Family Medicine

## 2018-10-12 ENCOUNTER — Encounter: Payer: Self-pay | Admitting: Family Medicine

## 2018-10-12 VITALS — BP 104/64 | HR 76 | Temp 97.7°F | Ht 69.0 in | Wt 171.0 lb

## 2018-10-12 DIAGNOSIS — E7800 Pure hypercholesterolemia, unspecified: Secondary | ICD-10-CM

## 2018-10-12 DIAGNOSIS — I10 Essential (primary) hypertension: Secondary | ICD-10-CM

## 2018-10-12 DIAGNOSIS — Z1211 Encounter for screening for malignant neoplasm of colon: Secondary | ICD-10-CM

## 2018-10-12 DIAGNOSIS — E039 Hypothyroidism, unspecified: Secondary | ICD-10-CM

## 2018-10-12 DIAGNOSIS — F329 Major depressive disorder, single episode, unspecified: Secondary | ICD-10-CM

## 2018-10-12 DIAGNOSIS — I483 Typical atrial flutter: Secondary | ICD-10-CM

## 2018-10-12 DIAGNOSIS — F419 Anxiety disorder, unspecified: Secondary | ICD-10-CM

## 2018-10-12 MED ORDER — BUSPIRONE HCL 7.5 MG PO TABS: 7.5000 mg | ORAL_TABLET | Freq: Two times a day (BID) | ORAL | 3 refills | Status: DC

## 2018-10-12 MED ORDER — SERTRALINE HCL 100 MG PO TABS: ORAL_TABLET | ORAL | 6 refills | Status: DC

## 2018-10-12 MED ORDER — LOSARTAN POTASSIUM 50 MG PO TABS: 50.0000 mg | ORAL_TABLET | Freq: Every day | ORAL | 6 refills | Status: DC

## 2018-10-12 MED ORDER — METOPROLOL TARTRATE 50 MG PO TABS: 50.0000 mg | ORAL_TABLET | Freq: Two times a day (BID) | ORAL | 6 refills | Status: DC

## 2018-10-12 MED ORDER — ATORVASTATIN CALCIUM 20 MG PO TABS: 20.0000 mg | ORAL_TABLET | Freq: Every day | ORAL | 6 refills | Status: DC

## 2018-10-12 MED ORDER — APIXABAN 5 MG PO TABS: 5.0000 mg | ORAL_TABLET | Freq: Two times a day (BID) | ORAL | 6 refills | Status: DC

## 2018-10-12 MED ORDER — LEVOTHYROXINE SODIUM 125 MCG PO TABS: 125.0000 ug | ORAL_TABLET | Freq: Every day | ORAL | 3 refills | Status: DC

## 2018-10-12 MED FILL — SERTRALINE HCL 100 MG TAB: 100 | 30 days supply | Qty: 30 | Fill #0

## 2018-10-12 MED FILL — BUSPIRONE HCL 7.5 MG TABS: 7.5 | 30 days supply | Qty: 60 | Fill #0

## 2018-10-12 MED FILL — LOSARTAN POTASSIUM 50 MG TA: 50 | 30 days supply | Qty: 30 | Fill #0

## 2018-10-12 MED FILL — ATORVASTATIN 20 MG TABLET: 20 | 30 days supply | Qty: 30 | Fill #0

## 2018-10-12 NOTE — Progress Notes (Signed)
Subjective:  Patient ID: Anita Russell, female    DOB: 08/11/1956  Age: 63 y.o. MRN: [id number]  CC: Hypertension   HPI Anita Russell  is a 63 year old female with a history of depression and anxiety, hypothyroidism, hypertension, Hyperlipidemia, SVT, atrial flutter (status post TEE cardioversion in 05/2017) who comes into the clinic for a follow-up visit. Her anxiety and depression have been uncontrolled and this is worsened by her current living situation where she lives with a host of other people and pets in a container.  Currently working on obtaining her own housing but finances have been a major constraint.  Denies suicidal ideation or intent but states Zoloft has not controlled her symptoms and so she uses CBD oil which has provided some benefit. Doing well on her antihypertensive and statin.  Denies chest pain, dyspnea, palpitations, irregular heartbeat and last saw her cardiologist in 07/2018. 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  Past Medical History:  Diagnosis Date  . Anxiety   . Asthma   . Depression   . Gout   . Hypertension   . Hypothyroidism   . Mental disorder   . Thyroid disease     Past Surgical History:  Procedure Laterality Date  . ABDOMINAL HYSTERECTOMY    . APPENDECTOMY    . CARDIOVERSION N/A 06/16/2017   Procedure: CARDIOVERSION;  Surgeon: Orpah CobbKadakia, Ajay, MD;  [facility];  Service: Cardiovascular;  Laterality: N/A;  . TEE WITHOUT CARDIOVERSION N/A 06/16/2017   Procedure: TRANSESOPHAGEAL ECHOCARDIOGRAM (TEE);  Surgeon: Orpah CobbKadakia, Ajay, MD;  [facility];  Service: Cardiovascular;  Laterality: N/A;    Family History  Problem Relation Age of Onset  . Arthritis Father   . Hypertension Father   . Gout Father   . Hypertension Mother     Allergies  Allergen Reactions  .  Hyzaar [Losartan Potassium-Hctz] Other (See Comments)    Caused patient to feel light-headed and gave her vertigo  . Meloxicam Rash    A "very bad" rash    Outpatient Medications Prior to Visit  Medication Sig Dispense Refill  . acetaminophen (TYLENOL) 500 MG tablet Take 2 tablets (1,000 mg total) by mouth every 8 (eight) hours as needed for mild pain, moderate pain or headache. 30 tablet 0  . albuterol (PROVENTIL HFA;VENTOLIN HFA) 108 (90 Base) MCG/ACT inhaler Inhale 1-2 puffs into the lungs every 6 (six) hours as needed for wheezing or shortness of breath. 1 Inhaler 0  . amiodarone (PACERONE) 200 MG tablet Take 1 tablet (200 mg total) daily by mouth. 30 tablet 3  . colchicine 0.6 MG tablet Take 2 tablets at the start of a gout attack. Then 1 more tablet an hour later. Do not repeat this dosing for 2 days. 30 tablet 1  . fluticasone (FLONASE) 50 MCG/ACT nasal spray Place 2 sprays into both nostrils daily. 16 g 6  . hydrocortisone 2.5 % cream Apply topically 2 (two) times daily. 30 g 2  . apixaban (ELIQUIS) 5 MG TABS tablet Take 1 tablet (5 mg total) by mouth 2 (two) times daily. 60 tablet 6  . atorvastatin (LIPITOR) 20 MG tablet Take 1 tablet (20 mg total) by mouth daily. 30 tablet 6  . levothyroxine (SYNTHROID, LEVOTHROID) 125 MCG tablet Take 1 tablet (125 mcg total) by mouth daily before breakfast. 30 tablet 3  . losartan (  COZAAR) 50 MG tablet Take 1 tablet (50 mg total) by mouth daily. 30 tablet 6  . metoprolol tartrate (LOPRESSOR) 50 MG tablet Take 1 tablet (50 mg total) by mouth 2 (two) times daily. 180 tablet 6  . sertraline (ZOLOFT) 100 MG tablet TAKE 1 TABLET BY MOUTH ONCE DAILY FOR ANXIETY AND DEPRESSION 30 tablet 6  . nicotine (NICODERM CQ - DOSED IN MG/24 HOURS) 21 mg/24hr patch Place 1 patch (21 mg total) onto the skin daily. For 1 month then 14 mg patch daily for 1 month then 7 mg patch daily for 1 month. (Patient not taking: Reported on 10/12/2018) 90 patch 0   No  facility-administered medications prior to visit.      ROS Review of Systems General: negative for fever, weight loss, appetite change Eyes: no visual symptoms. ENT: no ear symptoms, no sinus tenderness, no nasal congestion or sore throat. Neck: no pain  Respiratory: no wheezing, shortness of breath, cough Cardiovascular: no chest pain, no dyspnea on exertion, no pedal edema, no orthopnea. Gastrointestinal: no abdominal pain, no diarrhea, no constipation Genito-Urinary: no urinary frequency, no dysuria, no polyuria. Hematologic: no bruising Endocrine: no cold or heat intolerance Neurological: no headaches, no seizures, no tremors Musculoskeletal: no joint pains, no joint swelling Skin: no pruritus, no rash. Psychological: no depression, + anxiety,    Objective:  BP 104/64   Pulse 76   Temp 97.7 F (36.5 C) (Oral)   Ht 5\' 9"  (1.753 m)   Wt 171 lb (77.6 kg)   SpO2 99%   BMI 25.25 kg/m   BP/Weight 10/12/2018 09/29/2018 09/28/2018  Systolic BP 104 158 131  Diastolic BP 64 99 65  Wt. (Lbs) 171 - -  BMI 25.25 - -  Some encounter information is confidential and restricted. Go to Review Flowsheets activity to see all data.      Physical Exam Constitutional: normal appearing,  Eyes: PERRLA HEENT: Head is atraumatic, normal sinuses, normal oropharynx, normal appearing tonsils and palate, tympanic membrane is normal bilaterally. Neck: normal range of motion, no thyromegaly, no JVD Cardiovascular: normal rate and rhythm, normal heart sounds, no murmurs, rub or gallop, no pedal edema Respiratory: Normal breath sounds, clear to auscultation bilaterally, no wheezes, no rales, no rhonchi Abdomen: soft, not tender to palpation, normal bowel sounds, no enlarged organs Musculoskeletal: Full ROM, no tenderness in joints Skin: warm and dry, no lesions. Neurological: alert, oriented x3, cranial nerves I-XII grossly intact , normal motor strength, normal sensation. Psychological: normal  mood.   CMP Latest Ref Rng & Units 04/22/2018 07/02/2017 06/14/2017  Glucose 65 - 99 mg/dL 90 87 99  BUN 8 - 27 mg/dL 15 12 12   Creatinine 0.57 - 1.00 mg/dL 3.34(D) 5.68 6.16  Sodium 134 - 144 mmol/L 139 139 138  Potassium 3.5 - 5.2 mmol/L 4.5 4.7 4.2  Chloride 96 - 106 mmol/L 99 100 100(L)  CO2 20 - 29 mmol/L 23 27 29   Calcium 8.7 - 10.3 mg/dL 9.5 9.3 9.2  Total Protein 6.0 - 8.5 g/dL 7.5 - -  Total Bilirubin 0.0 - 1.2 mg/dL 0.3 - -  Alkaline Phos 39 - 117 IU/L 83 - -  AST 0 - 40 IU/L 26 - -  ALT 0 - 32 IU/L 15 - -    Lipid Panel     Component Value Date/Time   CHOL 215 (H) 04/22/2018 0922   TRIG 214 (H) 04/22/2018 0922   HDL 82 04/22/2018 0922   CHOLHDL 2.6 04/22/2018 8372  CHOLHDL 3.5 06/12/2017 0418   VLDL 42 (H) 06/12/2017 0418   LDLCALC 90 04/22/2018 0922    CBC    Component Value Date/Time   WBC 5.8 07/02/2017 1045   WBC 4.5 06/17/2017 0357   RBC 4.26 07/02/2017 1045   RBC 3.93 06/17/2017 0357   HGB 12.8 07/02/2017 1045   HCT 38.7 07/02/2017 1045   PLT 236 07/02/2017 1045   MCV 91 07/02/2017 1045   MCH 30.0 07/02/2017 1045   MCH 30.5 06/17/2017 0357   MCHC 33.1 07/02/2017 1045   MCHC 33.1 06/17/2017 0357   RDW 13.8 07/02/2017 1045   LYMPHSABS 1.5 07/02/2017 1045   MONOABS 0.5 06/11/2017 2302   EOSABS 0.3 07/02/2017 1045   BASOSABS 0.0 07/02/2017 1045    Lab Results  Component Value Date   HGBA1C 5.0 01/05/2015    [facility]:- Chol - 195, LDL-42, HDL-88, Trig - 327 Tsh 1.7  Assessment & Plan:   1. Pure hypercholesterolemia Normal total cholesterol and LDL of 42 which is at goal but elevated triglycerides of 327 Advised to commence omega-3 fish oil capsule, low-cholesterol diet - atorvastatin (LIPITOR) 20 MG tablet; Take 1 tablet (20 mg total) by mouth daily.  Dispense: 30 tablet; Refill: 6  2. Essential hypertension Controlled Counseled on blood pressure goal of less than 130/80, low-sodium, DASH diet, medication compliance,  150 minutes of moderate intensity exercise per week. Discussed medication compliance, adverse effects. - metoprolol tartrate (LOPRESSOR) 50 MG tablet; Take 1 tablet (50 mg total) by mouth 2 (two) times daily.  Dispense: 180 tablet; Refill: 6  3. Anxiety and depression Uncontrolled; exacerbated by underlying psychosocial situations BuSpar added to regimen Seen by LCSW at last visit - sertraline (ZOLOFT) 100 MG tablet; TAKE 1 TABLET BY MOUTH ONCE DAILY FOR ANXIETY AND DEPRESSION  Dispense: 30 tablet; Refill: 6  4. Screening for colon cancer - Ambulatory referral to Gastroenterology  5. Typical atrial flutter (HCC) Status post TEE cardioversion in 05/2017 Continue amiodarone, Eliquis  6. Acquired hypothyroidism Controlled - levothyroxine (SYNTHROID, LEVOTHROID) 125 MCG tablet; Take 1 tablet (125 mcg total) by mouth daily before breakfast.  Dispense: 30 tablet; Refill: 3   Meds ordered this encounter  Medications  . busPIRone (BUSPAR) 7.5 MG tablet    Sig: Take 1 tablet (7.5 mg total) by mouth 2 (two) times daily.    Dispense:  60 tablet    Refill:  3  . apixaban (ELIQUIS) 5 MG TABS tablet    Sig: Take 1 tablet (5 mg total) by mouth 2 (two) times daily.    Dispense:  60 tablet    Refill:  6  . atorvastatin (LIPITOR) 20 MG tablet    Sig: Take 1 tablet (20 mg total) by mouth daily.    Dispense:  30 tablet    Refill:  6  . losartan (COZAAR) 50 MG tablet    Sig: Take 1 tablet (50 mg total) by mouth daily.    Dispense:  30 tablet    Refill:  6  . metoprolol tartrate (LOPRESSOR) 50 MG tablet    Sig: Take 1 tablet (50 mg total) by mouth 2 (two) times daily.    Dispense:  180 tablet    Refill:  6  . sertraline (ZOLOFT) 100 MG tablet    Sig: TAKE 1 TABLET BY MOUTH ONCE DAILY FOR ANXIETY AND DEPRESSION    Dispense:  30 tablet    Refill:  6  . levothyroxine (SYNTHROID, LEVOTHROID) 125 MCG tablet  Sig: Take 1 tablet (125 mcg total) by mouth daily before breakfast.    Dispense:  30  tablet    Refill:  3    Follow-up: Return in about 1 month (around 11/10/2018) for PAP smear.       Hoy Register, MD, FAAFP. [facility] [place], [place] [phone]   10/12/2018, 12:42 PM

## 2018-10-28 MED FILL — $ELIQUIS 5 MG TABLET: 5 | 90 days supply | Qty: 180 | Fill #2

## 2018-10-28 MED FILL — METOPROLOL TARTRATE 50 MG T: 50 | 30 days supply | Qty: 60 | Fill #10

## 2018-10-28 MED FILL — AMIODARONE HCL 200 MG TAB: 200 | 32 days supply | Qty: 8 | Fill #2

## 2018-10-28 MED FILL — LEVOTHYROXINE 125 MCG TAB: 125 | 30 days supply | Qty: 30 | Fill #1

## 2018-11-12 ENCOUNTER — Other Ambulatory Visit: Payer: Self-pay | Admitting: Family Medicine

## 2018-11-16 MED FILL — busPIRone HCL 7.5 MG TABS: 7.5 | 30 days supply | Qty: 60 | Fill #1

## 2018-11-16 MED FILL — SERTRALINE HCL 100 MG TAB: 100 | 30 days supply | Qty: 30 | Fill #1

## 2018-11-23 MED FILL — !COLCRYS 0.6 MG TABLET: 0.6 MG | 10 days supply | Qty: 10 | Fill #1

## 2018-11-23 MED FILL — LOSARTAN POTASSIUM 50 MG TA: 50 | 30 days supply | Qty: 30 | Fill #1

## 2018-11-23 MED FILL — LEVOTHYROXINE 125 MCG TAB: 125 | 30 days supply | Qty: 30 | Fill #2

## 2018-11-23 MED FILL — ?ATORVASTATIN 20 MG TABLET: 20 | 30 days supply | Qty: 30 | Fill #1

## 2018-11-23 MED FILL — ?METOPROLOL 50 MG TABLET: 50 | 30 days supply | Qty: 60 | Fill #0

## 2018-12-22 MED FILL — LOSARTAN POTASSIUM 50 MG TA: 50 | 30 days supply | Qty: 30 | Fill #2

## 2018-12-22 MED FILL — LEVOTHYROXINE 125 MCG TAB: 125 | 30 days supply | Qty: 30 | Fill #3

## 2018-12-22 MED FILL — $ELIQUIS 5 MG TABLET: 5 | 90 days supply | Qty: 180 | Fill #0

## 2018-12-22 MED FILL — AMIODARONE HCL 200 MG TAB: 200 | 32 days supply | Qty: 8 | Fill #3

## 2018-12-22 MED FILL — ?METOPROLOL 50 MG TABLET: 50 | 30 days supply | Qty: 60 | Fill #1

## 2018-12-22 MED FILL — ?ATORVASTATIN 20 MG TABLET: 20 | 30 days supply | Qty: 30 | Fill #2

## 2018-12-22 MED FILL — SERTRALINE HCL 100 MG TAB: 100 | 30 days supply | Qty: 30 | Fill #2

## 2019-01-25 MED FILL — LOSARTAN POTASSIUM 50 MG TA: 50 | 30 days supply | Qty: 30 | Fill #3

## 2019-01-25 MED FILL — SERTRALINE HCL 100 MG TAB: 100 | 30 days supply | Qty: 30 | Fill #3

## 2019-01-25 MED FILL — AMIODARONE HCL 200 MG TAB: 200 | 32 days supply | Qty: 8 | Fill #4

## 2019-01-25 MED FILL — LEVOTHYROXINE 125 MCG TAB: 125 | 30 days supply | Qty: 30 | Fill #0

## 2019-01-25 MED FILL — ?ATORVASTATIN 20 MG TABLET: 20 | 30 days supply | Qty: 30 | Fill #3

## 2019-01-25 MED FILL — ?METOPROLOL 50 MG TABLET: 50 | 30 days supply | Qty: 60 | Fill #2

## 2019-02-23 MED FILL — SERTRALINE HCL 100 MG TAB: 100 | 30 days supply | Qty: 30 | Fill #4

## 2019-02-23 MED FILL — AMIODARONE HCL 200 MG TAB: 200 | 32 days supply | Qty: 8 | Fill #5

## 2019-02-23 MED FILL — LOSARTAN POTASSIUM 50 MG TA: 50 | 30 days supply | Qty: 30 | Fill #4

## 2019-02-23 MED FILL — ?METOPROLOL 50 MG TABLET: 50 | 30 days supply | Qty: 60 | Fill #3

## 2019-02-23 MED FILL — ?ATORVASTATIN 20 MG TABLET: 20 | 30 days supply | Qty: 30 | Fill #4

## 2019-02-23 MED FILL — LEVOTHYROXINE 125 MCG TAB: 125 | 30 days supply | Qty: 30 | Fill #1

## 2019-02-23 MED FILL — busPIRone HCL 7.5 MG TABS: 7.5 | 30 days supply | Qty: 60 | Fill #2

## 2019-03-02 ENCOUNTER — Emergency Department (HOSPITAL_COMMUNITY)
Admission: EM | Admit: 2019-03-02 | Discharge: 2019-03-02 | Disposition: A | Discharge status: Home / Self Care | Payer: Medicare Other | Attending: Emergency Medicine | Admitting: Emergency Medicine

## 2019-03-02 ENCOUNTER — Encounter (HOSPITAL_COMMUNITY): Payer: Self-pay

## 2019-03-02 ENCOUNTER — Other Ambulatory Visit: Payer: Self-pay

## 2019-03-02 ENCOUNTER — Emergency Department (HOSPITAL_COMMUNITY): Payer: Medicare Other

## 2019-03-02 DIAGNOSIS — W010XXA Fall on same level from slipping, tripping and stumbling without subsequent striking against object, initial encounter: Secondary | ICD-10-CM | POA: Diagnosis not present

## 2019-03-02 DIAGNOSIS — S53104A Unspecified dislocation of right ulnohumeral joint, initial encounter: Secondary | ICD-10-CM

## 2019-03-02 DIAGNOSIS — W19XXXA Unspecified fall, initial encounter: Secondary | ICD-10-CM

## 2019-03-02 DIAGNOSIS — J45909 Unspecified asthma, uncomplicated: Secondary | ICD-10-CM | POA: Diagnosis not present

## 2019-03-02 DIAGNOSIS — Z79899 Other long term (current) drug therapy: Secondary | ICD-10-CM | POA: Diagnosis not present

## 2019-03-02 DIAGNOSIS — I10 Essential (primary) hypertension: Secondary | ICD-10-CM | POA: Insufficient documentation

## 2019-03-02 DIAGNOSIS — S53124A Posterior dislocation of right ulnohumeral joint, initial encounter: Secondary | ICD-10-CM | POA: Diagnosis not present

## 2019-03-02 DIAGNOSIS — E039 Hypothyroidism, unspecified: Secondary | ICD-10-CM | POA: Insufficient documentation

## 2019-03-02 DIAGNOSIS — Y999 Unspecified external cause status: Secondary | ICD-10-CM | POA: Insufficient documentation

## 2019-03-02 DIAGNOSIS — Z888 Allergy status to other drugs, medicaments and biological substances status: Secondary | ICD-10-CM | POA: Diagnosis not present

## 2019-03-02 DIAGNOSIS — Y939 Activity, unspecified: Secondary | ICD-10-CM | POA: Diagnosis not present

## 2019-03-02 DIAGNOSIS — Y92007 Garden or yard of unspecified non-institutional (private) residence as the place of occurrence of the external cause: Secondary | ICD-10-CM | POA: Insufficient documentation

## 2019-03-02 DIAGNOSIS — E785 Hyperlipidemia, unspecified: Secondary | ICD-10-CM | POA: Insufficient documentation

## 2019-03-02 DIAGNOSIS — S59901A Unspecified injury of right elbow, initial encounter: Secondary | ICD-10-CM | POA: Diagnosis present

## 2019-03-02 DIAGNOSIS — F17210 Nicotine dependence, cigarettes, uncomplicated: Secondary | ICD-10-CM | POA: Diagnosis not present

## 2019-03-02 DIAGNOSIS — I4892 Unspecified atrial flutter: Secondary | ICD-10-CM | POA: Diagnosis not present

## 2019-03-02 LAB — CBC WITH DIFFERENTIAL/PLATELET
Abs Immature Granulocytes: 0.04 10*3/uL (ref 0.00–0.07)
Basophils Absolute: 0 10*3/uL (ref 0.0–0.1)
Basophils Relative: 0 %
Eosinophils Absolute: 0.1 10*3/uL (ref 0.0–0.5)
Eosinophils Relative: 1 %
HCT: 43.6 % (ref 36.0–46.0)
Hemoglobin: 14 g/dL (ref 12.0–15.0)
Immature Granulocytes: 1 %
Lymphocytes Relative: 15 %
Lymphs Abs: 1 10*3/uL (ref 0.7–4.0)
MCH: 30 pg (ref 26.0–34.0)
MCHC: 32.1 g/dL (ref 30.0–36.0)
MCV: 93.6 fL (ref 80.0–100.0)
Monocytes Absolute: 0.5 10*3/uL (ref 0.1–1.0)
Monocytes Relative: 7 %
Neutro Abs: 5 10*3/uL (ref 1.7–7.7)
Neutrophils Relative %: 76 %
Platelets: 180 10*3/uL (ref 150–400)
RBC: 4.66 MIL/uL (ref 3.87–5.11)
RDW: 13.2 % (ref 11.5–15.5)
WBC: 6.5 10*3/uL (ref 4.0–10.5)
nRBC: 0 % (ref 0.0–0.2)

## 2019-03-02 LAB — TYPE AND SCREEN
ABO/RH(D): O POS
Antibody Screen: NEGATIVE

## 2019-03-02 MED ORDER — PROPOFOL 10 MG/ML IV BOLUS
1.0000 mg/kg | Freq: Once | INTRAVENOUS | Status: AC
  Administered 2019-03-02: 21:00:00 100 mg via INTRAVENOUS
  Filled 2019-03-02: qty 20

## 2019-03-02 MED ORDER — FENTANYL CITRATE (PF) 100 MCG/2ML IJ SOLN
50.0000 ug | Freq: Once | INTRAMUSCULAR | Status: AC
  Administered 2019-03-02: 20:00:00 50 ug via INTRAVENOUS
  Filled 2019-03-02: qty 2

## 2019-03-02 MED ORDER — FENTANYL CITRATE (PF) 100 MCG/2ML IJ SOLN
50.0000 ug | Freq: Once | INTRAMUSCULAR | Status: AC
  Administered 2019-03-02: 21:00:00 50 ug via INTRAVENOUS
  Filled 2019-03-02: qty 2

## 2019-03-02 MED ORDER — OXYCODONE-ACETAMINOPHEN 5-325 MG PO TABS: 1.0000 | ORAL_TABLET | ORAL | 0 refills | Status: DC | PRN

## 2019-03-02 NOTE — ED Provider Notes (Signed)
[facility] DEPT Provider Note   CSN: [id number] Arrival date & time: 03/02/19  1717     History   Chief Complaint Chief Complaint  Patient presents with  . Fall  . Arm Injury    HPI Camella Seim is a 63 y.o. female.     HPI  Past Medical History:  Diagnosis Date  . Anxiety   . Asthma   . Depression   . Gout   . Hypertension   . Hypothyroidism   . Mental disorder   . Thyroid disease     Patient Active Problem List   Diagnosis Date Noted  . Atrial flutter ([place]) 07/09/2018  . SVT (supraventricular tachycardia) ([place]) 06/11/2017  . Cervical high risk HPV (human papillomavirus) test positive 04/01/2017  . Asthma 02/04/2017  . Hyperlipidemia 07/05/2016  . Loss of weight 07/13/2015  . Chronic RUQ pain 07/13/2015  . Diarrhea 07/13/2015  . Smoking 04/08/2014  . IFG (impaired fasting glucose) 04/08/2014  . Arthritis 10/29/2013  . Anxiety and depression 10/29/2013  . Periodic health assessment, general screening, adult 10/29/2013  . Tinnitus 10/29/2013  . Anxiety 06/17/2012  . Alcohol abuse 06/14/2012  . Gout 06/14/2012  . Hypertension 06/14/2012  . Hypothyroidism 06/14/2012    Past Surgical History:  Procedure Laterality Date  . ABDOMINAL HYSTERECTOMY    . APPENDECTOMY    . CARDIOVERSION N/A 06/16/2017   Procedure: CARDIOVERSION;  Surgeon: Dixie Dials, MD;  Location: [facility] ENDOSCOPY;  Service: Cardiovascular;  Laterality: N/A;  . TEE WITHOUT CARDIOVERSION N/A 06/16/2017   Procedure: TRANSESOPHAGEAL ECHOCARDIOGRAM (TEE);  Surgeon: Dixie Dials, MD;  Location: [facility] ENDOSCOPY;  Service: Cardiovascular;  Laterality: N/A;     OB History    Gravida  2   Para      Term      Preterm      AB  1   Living  1     SAB      TAB      Ectopic      Multiple      Live Births  1            Home Medications    Prior to Admission medications   Medication Sig Start Date End Date Taking? Authorizing Provider   albuterol (PROVENTIL HFA;VENTOLIN HFA) 108 (90 Base) MCG/ACT inhaler Inhale 1-2 puffs into the lungs every 6 (six) hours as needed for wheezing or shortness of breath. 09/29/18  Yes Wieters, Hallie C, PA-C  amiodarone (PACERONE) 200 MG tablet Take 1 tablet (200 mg total) daily by mouth. 07/02/17  Yes McClung, Dionne Bucy, PA-C  apixaban (ELIQUIS) 5 MG TABS tablet Take 1 tablet (5 mg total) by mouth 2 (two) times daily. 10/12/18  Yes Charlott Rakes, MD  atorvastatin (LIPITOR) 20 MG tablet Take 1 tablet (20 mg total) by mouth daily. 10/12/18  Yes Newlin, Charlane Ferretti, MD  busPIRone (BUSPAR) 7.5 MG tablet Take 1 tablet (7.5 mg total) by mouth 2 (two) times daily. 10/12/18  Yes Charlott Rakes, MD  colchicine 0.6 MG tablet Take 2 tablets at the start of a gout attack. Then 1 more tablet an hour later. Do not repeat this dosing for 2 days. Patient taking differently: Take 0.12 mg by mouth daily as needed (gout pain). Take 2 tablets at the start of a gout attack. Then 1 more tablet an hour later. Do not repeat this dosing for 2 days. 12/03/17  Yes Jaynee Eagles, PA-C  levothyroxine (SYNTHROID, LEVOTHROID) 125 MCG tablet Take 1 tablet (  125 mcg total) by mouth daily before breakfast. 10/12/18  Yes Newlin, [name], MD  losartan (COZAAR) 50 MG tablet Take 1 tablet (50 mg total) by mouth daily. 10/12/18 10/12/19 Yes Hoy RegisterNewlin, [name], MD  metoprolol tartrate (LOPRESSOR) 50 MG tablet Take 1 tablet (50 mg total) by mouth 2 (two) times daily. 10/12/18  Yes Newlin, Odette HornsEnobong, MD  sertraline (ZOLOFT) 100 MG tablet TAKE 1 TABLET BY MOUTH ONCE DAILY FOR ANXIETY AND DEPRESSION 10/12/18  Yes Hoy RegisterNewlin, [name], MD  acetaminophen (TYLENOL) 500 MG tablet Take 2 tablets (1,000 mg total) by mouth every 8 (eight) hours as needed for mild pain, moderate pain or headache. Patient not taking: Reported on 03/02/2019 02/07/18   Georgetta HaberBurky, Natalie B, NP  fluticasone (FLONASE) 50 MCG/ACT nasal spray Place 2 sprays into both nostrils daily. Patient not taking:  Reported on 03/02/2019 11/14/17   Hoy RegisterNewlin, [name], MD  hydrocortisone 2.5 % cream Apply topically 2 (two) times daily. Patient taking differently: Apply 1 application topically 2 (two) times daily as needed (itching).  03/11/17   Hoy RegisterNewlin, [name], MD  nicotine (NICODERM CQ - DOSED IN MG/24 HOURS) 21 mg/24hr patch Place 1 patch (21 mg total) onto the skin daily. For 1 month then 14 mg patch daily for 1 month then 7 mg patch daily for 1 month. Patient not taking: Reported on 10/12/2018 11/14/17   Hoy RegisterNewlin, [name], MD    Family History Family History  Problem Relation Age of Onset  . Arthritis Father   . Hypertension Father   . Gout Father   . Hypertension Mother     Social History Social History   Tobacco Use  . Smoking status: Current Every Day Smoker    Packs/day: 1.00    Types: Cigarettes  . Smokeless tobacco: Never Used  Substance Use Topics  . Alcohol use: No    Alcohol/week: 4.0 - 7.0 standard drinks    Types: 1 - 3 Glasses of wine, 3 - 4 Cans of beer per week    Frequency: Never    Comment: 3 times weekly  . Drug use: No     Allergies   Hyzaar [losartan potassium-hctz] and Meloxicam   Review of Systems Review of Systems   Physical Exam Updated Vital Signs BP (!) 153/88 (BP Location: Left Arm)   Pulse 72   Temp 97.8 F (36.6 C) (Oral)   Resp 17   Ht 5\' 9"  (1.753 m)   Wt 80.7 kg   SpO2 100%   BMI 26.29 kg/m   Physical Exam   ED Treatments / Results  Labs (all labs ordered are listed, but only abnormal results are displayed) Labs Reviewed  CBC WITH DIFFERENTIAL/PLATELET  BASIC METABOLIC PANEL  TYPE AND SCREEN    EKG EKG Interpretation  Date/Time:  Tuesday March 02 2019 19:53:07 EDT Ventricular Rate:  70 PR Interval:    QRS Duration: 86 QT Interval:  486 QTC Calculation: 525 R Axis:   85 Text Interpretation:  Atrial fibrillation Borderline right axis deviation Borderline repolarization abnormality Prolonged QT interval Baseline wander in lead(s) V1  V2 V6 Confirmed by Meridee ScoreButler, Martavia Tye [phone]([id number]) on 03/02/2019 7:55:41 PM   Radiology Dg Elbow Complete Right  Result Date: 03/02/2019 CLINICAL DATA:  Pt came from home by EMS after she tripped in the yard and fell, reached out with right arm to cushion fall, obvious deformity to right elbow. Pt reported she dislocated her right elbow "back in high school years ago". EXAM: RIGHT ELBOW - COMPLETE 3+ VIEW COMPARISON:  None. FINDINGS:  Posterior dislocation of the ulna and distal radius. No definite fracture, although this assessment is somewhat limited by positioning. There is diffuse surrounding soft tissue swelling. IMPRESSION: 1. Posterior dislocation of the ulna and radius in relation to the distal humerus with surrounding soft tissue swelling. No defined fracture. Electronically Signed   By: Amie Portlandavid  Ormond M.D.   On: 03/02/2019 20:25    Procedures .Sedation  Date/Time: 03/02/2019 9:28 PM Performed by: Terrilee FilesButler, Quindarrius Joplin C, MD Authorized by: Terrilee FilesButler, Rebeccah Ivins C, MD   Consent:    Consent obtained:  Verbal and written   Consent given by:  Patient   Risks discussed:  Allergic reaction, dysrhythmia, inadequate sedation, nausea, prolonged hypoxia resulting in organ damage, prolonged sedation necessitating reversal, respiratory compromise necessitating ventilatory assistance and intubation and vomiting   Alternatives discussed:  Analgesia without sedation, anxiolysis and regional anesthesia Universal protocol:    Procedure explained and questions answered to patient or proxy's satisfaction: yes     Relevant documents present and verified: yes     Test results available and properly labeled: yes     Imaging studies available: yes     Required blood products, implants, devices, and special equipment available: yes     Site/side marked: yes     Immediately prior to procedure a time out was called: yes     Patient identity confirmation method:  Verbally with patient, arm band and provided demographic data  Indications:    Procedure performed:  Dislocation reduction   Procedure necessitating sedation performed by:  Physician performing sedation Pre-sedation assessment:    Time since last food or drink:  6 hours   ASA classification: class 1 - normal, healthy patient     Neck mobility: normal     Mouth opening:  3 or more finger widths   Thyromental distance:  4 finger widths   Mallampati score:  II - soft palate, uvula, fauces visible   Pre-sedation assessments completed and reviewed: airway patency, cardiovascular function, hydration status, mental status, nausea/vomiting, pain level, respiratory function and temperature   Immediate pre-procedure details:    Reassessment: Patient reassessed immediately prior to procedure     Reviewed: vital signs, relevant labs/tests and NPO status     Verified: bag valve mask available, emergency equipment available, intubation equipment available, IV patency confirmed, oxygen available and suction available   Procedure details (see MAR for exact dosages):    Preoxygenation:  Nasal cannula   Sedation:  Propofol   Intra-procedure monitoring:  Blood pressure monitoring, cardiac monitor, continuous pulse oximetry, frequent LOC assessments, frequent vital sign checks and continuous capnometry   Intra-procedure events: none     Total Provider sedation time (minutes):  20 Post-procedure details:    Attendance: Constant attendance by certified staff until patient recovered     Recovery: Patient returned to pre-procedure baseline     Post-sedation assessments completed and reviewed: airway patency, cardiovascular function, hydration status, mental status, nausea/vomiting, pain level, respiratory function and temperature     Patient is stable for discharge or admission: yes     Patient tolerance:  Tolerated well, no immediate complications .Ortho Injury Treatment  Date/Time: 03/02/2019 9:29 PM Performed by: Terrilee FilesButler, Hatley Henegar C, MD Authorized by: Terrilee FilesButler, Aliese Brannum C,  MD   Consent:    Consent obtained:  Written   Consent given by:  Patient   Risks discussed:  Fracture, nerve damage, irreducible dislocation, recurrent dislocation and vascular damage   Alternatives discussed:  No treatment, delayed treatment, immobilization and referralInjury location: elbow Location  details: right elbow Injury type: dislocation Pre-procedure neurovascular assessment: neurovascularly intact Pre-procedure distal perfusion: normal Pre-procedure neurological function: normal Pre-procedure range of motion: reduced  Anesthesia: Local anesthesia used: no  Patient sedated: Yes. Refer to sedation procedure documentation for details of sedation. Manipulation performed: yes Reduction method: traction and counter traction Reduction successful: yes X-ray confirmed reduction: yes Immobilization: splint and sling Splint type: long arm Supplies used: Ortho-Glass Post-procedure neurovascular assessment: post-procedure neurovascularly intact Post-procedure distal perfusion: normal Post-procedure neurological function: normal Post-procedure range of motion: improved Patient tolerance: patient tolerated the procedure well with no immediate complications    (including critical care time)  Medications Ordered in ED Medications  fentaNYL (SUBLIMAZE) injection 50 mcg (50 mcg Intravenous Given 03/02/19 1949)  fentaNYL (SUBLIMAZE) injection 50 mcg (50 mcg Intravenous Given 03/02/19 2050)  propofol (DIPRIVAN) 10 mg/mL bolus/IV push 80.7 mg (100 mg Intravenous Given 03/02/19 2126)     Initial Impression / Assessment and Plan / ED Course  I have reviewed the triage vital signs and the nursing notes.  Pertinent labs & imaging results that were available during my care of the patient were reviewed by me and considered in my medical decision making (see chart for details).  Clinical Course as of Mar 02 2127  Tue Mar 02, 2019  [id number] 63 year old female here after a fall in which she  injured her right arm.  She is complaining of severe pain to her right elbow.  X-ray shows a posterior elbow dislocation.  Of updated the patient and discussed conscious sedation and reduction with her.   [MB]  2101 Discussed case with Dr. Ophelia CharterYates who suggests reducing in the ED with outpatient follow up.    [MV]    Clinical Course User Index [MB] Terrilee FilesButler, Rubylee Zamarripa C, MD [MV] Tanda RockersVenter, Margaux, PA-C         Final Clinical Impressions(s) / ED Diagnoses   Final diagnoses:  Fall, initial encounter  Dislocation of right elbow, initial encounter    ED Discharge Orders         Ordered    oxyCODONE-acetaminophen (PERCOCET/ROXICET) 5-325 MG tablet  Every 4 hours PRN     03/02/19 2227           Terrilee FilesButler, Pretty Weltman C, MD 03/02/19 2337

## 2019-03-02 NOTE — ED Triage Notes (Signed)
EMS reports from home, tripped in yard, fall, reached out with right arm to cushion fall, obvious deformity to right elbow. No LOC, no head strike, takes Eloquis.  BP 100/60 HR 60 RR 16 Sp02 96 RA  20ga L hand 136mcg Fentanyl

## 2019-03-02 NOTE — Progress Notes (Signed)
Elbow post reduction satisfactory position , no fracture seen. Splint at 90 degrees office followup in one week.

## 2019-03-02 NOTE — ED Provider Notes (Signed)
[place] DEPT Provider Note   CSN: [id number] Arrival date & time: 03/02/19  1717    History   Chief Complaint Chief Complaint  Patient presents with  . Fall  . Arm Injury    HPI Anita Russell is a 64 y.o. female with PMHx HTN who presents to the ED via EMS for a right arm injury s/p ground level fall that occurred 3 hours ago. Pt reports that she tripped and fell in the yard and landed onto her outstretched arm, causing immediate pain to it. She reports that her arm was "twisted in a strange way." No head injury or LOC. Pt is anticoagulated on eliquis. Pt was given 100 mcg Fentanyl en route. No other complaints at this time.        Past Medical History:  Diagnosis Date  . Anxiety   . Asthma   . Depression   . Gout   . Hypertension   . Hypothyroidism   . Mental disorder   . Thyroid disease     Patient Active Problem List   Diagnosis Date Noted  . Atrial flutter ([place]) 07/09/2018  . SVT (supraventricular tachycardia) ([place]) 06/11/2017  . Cervical high risk HPV (human papillomavirus) test positive 04/01/2017  . Asthma 02/04/2017  . Hyperlipidemia 07/05/2016  . Loss of weight 07/13/2015  . Chronic RUQ pain 07/13/2015  . Diarrhea 07/13/2015  . Smoking 04/08/2014  . IFG (impaired fasting glucose) 04/08/2014  . Arthritis 10/29/2013  . Anxiety and depression 10/29/2013  . Periodic health assessment, general screening, adult 10/29/2013  . Tinnitus 10/29/2013  . Anxiety 06/17/2012  . Alcohol abuse 06/14/2012  . Gout 06/14/2012  . Hypertension 06/14/2012  . Hypothyroidism 06/14/2012    Past Surgical History:  Procedure Laterality Date  . ABDOMINAL HYSTERECTOMY    . APPENDECTOMY    . CARDIOVERSION N/A 06/16/2017   Procedure: CARDIOVERSION;  Surgeon: Dixie Dials, MD;  Location: [facility] END[facility]COPY;  Service: Cardiovascular;  Laterality: N/A;  . TEE WITHOUT CARDIOVERSION N/A 06/16/2017   Procedure: TRANSESOPHAGEAL ECHOCARDIOGRAM (TEE);   Surgeon: Dixie Dials, MD;  Location: [facility] ENDOSCOPY;  Service: Cardiovascular;  Laterality: N/A;     OB History    Gravida  2   Para      Term      Preterm      AB  1   Living  1     SAB      TAB      Ectopic      Multiple      Live Births  1            Home Medications    Prior to Admission medications   Medication Sig Start Date End Date Taking? Authorizing Provider  albuterol (PROVENTIL HFA;VENTOLIN HFA) 108 (90 Base) MCG/ACT inhaler Inhale 1-2 puffs into the lungs every 6 (six) hours as needed for wheezing or shortness of breath. 09/29/18  Yes Wieters, Hallie C, PA-C  amiodarone (PACERONE) 200 MG tablet Take 1 tablet (200 mg total) daily by mouth. 07/02/17  Yes McClung, Dionne Bucy, PA-C  apixaban (ELIQUIS) 5 MG TABS tablet Take 1 tablet (5 mg total) by mouth 2 (two) times daily. 10/12/18  Yes Charlott Rakes, MD  atorvastatin (LIPITOR) 20 MG tablet Take 1 tablet (20 mg total) by mouth daily. 10/12/18  Yes Newlin, Charlane Ferretti, MD  busPIRone (BUSPAR) 7.5 MG tablet Take 1 tablet (7.5 mg total) by mouth 2 (two) times daily. 10/12/18  Yes Charlott Rakes, MD  colchicine 0.6 MG  tablet Take 2 tablets at the start of a gout attack. Then 1 more tablet an hour later. Do not repeat this dosing for 2 days. Patient taking differently: Take 0.12 mg by mouth daily as needed (gout pain). Take 2 tablets at the start of a gout attack. Then 1 more tablet an hour later. Do not repeat this dosing for 2 days. 12/03/17  Yes Wallis BambergMani, Mario, PA-C  levothyroxine (SYNTHROID, LEVOTHROID) 125 MCG tablet Take 1 tablet (125 mcg total) by mouth daily before breakfast. 10/12/18  Yes Newlin, [name], MD  losartan (COZAAR) 50 MG tablet Take 1 tablet (50 mg total) by mouth daily. 10/12/18 10/12/19 Yes Hoy RegisterNewlin, [name], MD  metoprolol tartrate (LOPRESSOR) 50 MG tablet Take 1 tablet (50 mg total) by mouth 2 (two) times daily. 10/12/18  Yes Newlin, Odette HornsEnobong, MD  sertraline (ZOLOFT) 100 MG tablet TAKE 1 TABLET BY MOUTH ONCE  DAILY FOR ANXIETY AND DEPRESSION 10/12/18  Yes Hoy RegisterNewlin, [name], MD  acetaminophen (TYLENOL) 500 MG tablet Take 2 tablets (1,000 mg total) by mouth every 8 (eight) hours as needed for mild pain, moderate pain or headache. Patient not taking: Reported on 03/02/2019 02/07/18   Georgetta HaberBurky, Natalie B, NP  fluticasone (FLONASE) 50 MCG/ACT nasal spray Place 2 sprays into both nostrils daily. Patient not taking: Reported on 03/02/2019 11/14/17   Hoy RegisterNewlin, [name], MD  hydrocortisone 2.5 % cream Apply topically 2 (two) times daily. Patient taking differently: Apply 1 application topically 2 (two) times daily as needed (itching).  03/11/17   Hoy RegisterNewlin, [name], MD  nicotine (NICODERM CQ - DOSED IN MG/24 HOURS) 21 mg/24hr patch Place 1 patch (21 mg total) onto the skin daily. For 1 month then 14 mg patch daily for 1 month then 7 mg patch daily for 1 month. Patient not taking: Reported on 10/12/2018 11/14/17   Hoy RegisterNewlin, [name], MD  oxyCODONE-acetaminophen (PERCOCET/ROXICET) 5-325 MG tablet Take 1 tablet by mouth every 4 (four) hours as needed for severe pain. 03/02/19   Tanda RockersVenter, Nariyah Osias, PA-C    Family History Family History  Problem Relation Age of Onset  . Arthritis Father   . Hypertension Father   . Gout Father   . Hypertension Mother     Social History Social History   Tobacco Use  . Smoking status: Current Every Day Smoker    Packs/day: 1.00    Types: Cigarettes  . Smokeless tobacco: Never Used  Substance Use Topics  . Alcohol use: No    Alcohol/week: 4.0 - 7.0 standard drinks    Types: 1 - 3 Glasses of wine, 3 - 4 Cans of beer per week    Frequency: Never    Comment: 3 times weekly  . Drug use: No     Allergies   Hyzaar [losartan potassium-hctz] and Meloxicam   Review of Systems Review of Systems  Constitutional: Negative for chills and fever.  HENT: Negative for congestion.   Eyes: Negative for visual disturbance.  Respiratory: Negative for cough.   Cardiovascular: Negative for chest pain.   Gastrointestinal: Negative for nausea and vomiting.  Genitourinary: Negative for difficulty urinating.  Musculoskeletal: Positive for arthralgias. Negative for back pain and neck pain.  Skin: Negative for wound.  Neurological: Negative for syncope.     Physical Exam Updated Vital Signs BP (!) 133/94   Pulse 69   Temp 97.8 F (36.6 C) (Oral)   Resp 19   Ht 5\' 9"  (1.753 m)   Wt 80.7 kg   SpO2 96%   BMI 26.29 kg/m  Physical Exam Vitals signs and nursing note reviewed.  Constitutional:      Appearance: She is not ill-appearing.  HENT:     Head: Normocephalic and atraumatic.  Eyes:     Conjunctiva/sclera: Conjunctivae normal.  Neck:     Musculoskeletal: Neck supple.  Cardiovascular:     Rate and Rhythm: Normal rate and regular rhythm.  Pulmonary:     Effort: Pulmonary effort is normal.     Breath sounds: Normal breath sounds.  Abdominal:     Palpations: Abdomen is soft.     Tenderness: There is no abdominal tenderness.  Musculoskeletal:     Comments: Obvious deformity to right elbow with diffuse tenderness to palpation; pt has mild tenderness throughout right arm but more so in the elbow; able to wiggle fingers; cap refill < 2 seconds; distal pulses intact. No C, T, or L midline spinal tenderness. No tenderness to all other joints.   Skin:    General: Skin is warm and dry.  Neurological:     Mental Status: She is alert.      ED Treatments / Results  Labs (all labs ordered are listed, but only abnormal results are displayed) Labs Reviewed  CBC WITH DIFFERENTIAL/PLATELET  BASIC METABOLIC PANEL  TYPE AND SCREEN  ABO/RH    EKG EKG Interpretation  Date/Time:  Tuesday March 02 2019 19:53:07 EDT Ventricular Rate:  70 PR Interval:    QRS Duration: 86 QT Interval:  486 QTC Calculation: 525 R Axis:   85 Text Interpretation:  Atrial fibrillation Borderline right axis deviation Borderline repolarization abnormality Prolonged QT interval Baseline wander in lead(s)  V1 V2 V6 Confirmed by Meridee ScoreButler, Michael [phone]([id number]) on 03/02/2019 7:55:41 PM   Radiology Dg Elbow 2 Views Right  Result Date: 03/02/2019 CLINICAL DATA:  Status post reduction EXAM: RIGHT ELBOW - 1 VIEW COMPARISON:  Films from earlier in the same day. FINDINGS: The elbow dislocation has been reduced. No definitive fracture is seen on this limited lateral view. IMPRESSION: Status post reduction. Electronically Signed   By: Alcide CleverMark  Lukens M.D.   On: 03/02/2019 22:10   Dg Elbow Complete Right  Result Date: 03/02/2019 CLINICAL DATA:  Pt came from home by EMS after she tripped in the yard and fell, reached out with right arm to cushion fall, obvious deformity to right elbow. Pt reported she dislocated her right elbow "back in high school years ago". EXAM: RIGHT ELBOW - COMPLETE 3+ VIEW COMPARISON:  None. FINDINGS: Posterior dislocation of the ulna and distal radius. No definite fracture, although this assessment is somewhat limited by positioning. There is diffuse surrounding soft tissue swelling. IMPRESSION: 1. Posterior dislocation of the ulna and radius in relation to the distal humerus with surrounding soft tissue swelling. No defined fracture. Electronically Signed   By: Amie Portlandavid  Ormond M.D.   On: 03/02/2019 20:25   Dg Chest Port 1 View  Result Date: 03/02/2019 CLINICAL DATA:  Pain EXAM: PORTABLE CHEST 1 VIEW COMPARISON:  09/28/2018 FINDINGS: Again noted is a large hiatal hernia. The heart size is stable. There is no pneumothorax. IMPRESSION: No active disease. Electronically Signed   By: Katherine Mantlehristopher  Green M.D.   On: 03/02/2019 22:23    Procedures Procedures (including critical care time)  Medications Ordered in ED Medications  fentaNYL (SUBLIMAZE) injection 50 mcg (50 mcg Intravenous Given 03/02/19 1949)  fentaNYL (SUBLIMAZE) injection 50 mcg (50 mcg Intravenous Given 03/02/19 2050)  propofol (DIPRIVAN) 10 mg/mL bolus/IV push 80.7 mg (100 mg Intravenous Given 03/02/19 2126)  Initial Impression /  Assessment and Plan / ED Course  I have reviewed the triage vital signs and the nursing notes.  Pertinent labs & imaging results that were available during my care of the patient were reviewed by me and considered in my medical decision making (see chart for details).  63 year old female presenting to the ED today after right arm injury s/p mechanical fall. Obvious deformity to right elbow; no other complaints. No head injury or LOC. No midline spinal tenderness. Will get xray today of the elbow; suspect dislocation vs fracture; baseline bloodwork for potential pre op ordered including CBC, BMP, type and screen, as well as EKG and CXR. Another 50 mcg Fentanyl ordered for patient; she already received 100 mcg en route about 2 hours ago.   Xray with posterior dislocation. Will consult orthopedic surgery at this time to see if they would like us to do the reduction in the ED or if they want to come see the patient. Another 50 mcg Fentanyl ordered.   Elbow reduced with Dr. Charm BargesButler in the ED; 2 view xrays ordered to determine if elbow is back in place.   Xrays with successful reduction; splint placed. Will discharge patient home with narcotic pain medications and follow up with Dr. Ophelia CharterYates. She is in agreement with plan at this time and stable for discharge home.   Clinical Course as of Mar 02 2227  Tue Mar 02, 2019  [id number] 63 year old female here after a fall in which she injured her right arm.  She is complaining of severe pain to her right elbow.  X-ray shows a posterior elbow dislocation.  Of updated the patient and discussed conscious sedation and reduction with her.   [MB]  2101 Discussed case with Dr. Ophelia CharterYates who suggests reducing in the ED with outpatient follow up.    [MV]    Clinical Course User Index [MB] Terrilee FilesButler, Michael C, MD [MV] Tanda RockersVenter, Shawnae Leiva, PA-C         Final Clinical Impressions(s) / ED Diagnoses   Final diagnoses:  Fall, initial encounter  Dislocation of right elbow, initial  encounter    ED Discharge Orders         Ordered    oxyCODONE-acetaminophen (PERCOCET/ROXICET) 5-325 MG tablet  Every 4 hours PRN     03/02/19 2227           Tanda RockersVenter, Keryl Gholson, PA-C 03/03/19 0148    Terrilee FilesButler, Michael C, MD 03/03/19 [phone]

## 2019-03-02 NOTE — ED Notes (Signed)
Bed: WA20 Expected date: 03/02/19 Expected time:  Means of arrival:  Comments: 63 fall arm deformity

## 2019-03-02 NOTE — Discharge Instructions (Signed)
You were seen in the ED today after an elbow dislocation; it was successfully reduced while in the ED. Please take pain medication as needed for your pain. Please call Dr. Lorin Mercy office in the morning for follow up.

## 2019-03-02 NOTE — ED Notes (Signed)
Per Lab recollect needed for light green tube. RN advised. [employer]

## 2019-03-03 LAB — ABO/RH: ABO/RH(D): O POS

## 2019-03-05 ENCOUNTER — Other Ambulatory Visit: Payer: Self-pay

## 2019-03-05 ENCOUNTER — Encounter: Payer: Self-pay | Admitting: Orthopaedic Surgery

## 2019-03-05 ENCOUNTER — Ambulatory Visit (INDEPENDENT_AMBULATORY_CARE_PROVIDER_SITE_OTHER): Payer: Medicare Other | Admitting: Orthopaedic Surgery

## 2019-03-05 ENCOUNTER — Ambulatory Visit (INDEPENDENT_AMBULATORY_CARE_PROVIDER_SITE_OTHER): Payer: Medicare Other

## 2019-03-05 VITALS — Ht 69.5 in | Wt 178.0 lb

## 2019-03-05 DIAGNOSIS — S53104D Unspecified dislocation of right ulnohumeral joint, subsequent encounter: Secondary | ICD-10-CM

## 2019-03-05 DIAGNOSIS — S53106A Unspecified dislocation of unspecified ulnohumeral joint, initial encounter: Secondary | ICD-10-CM | POA: Insufficient documentation

## 2019-03-05 DIAGNOSIS — M25521 Pain in right elbow: Secondary | ICD-10-CM

## 2019-03-05 NOTE — Progress Notes (Signed)
Office Visit Note   Patient: Anita Russell           Date of Birth: 12-30-1955           MRN: [id number] Visit Date: 03/05/2019              Requested by: Charlott Rakes, MD [place],  [place] [id number] PCP: Charlott Rakes, MD   Assessment & Plan: Visit Diagnoses:  1. Pain in right elbow   2. Dislocation of right elbow, subsequent encounter     Plan: Posterior splint reapplied she will start working on elbow flexion today starting Tuesday she will work on gentle elbow extension I went over the exercises with her she is able to demonstrate good knowledge of what she supposed to do.  She can discontinue the posterior splint at 2 weeks post injury.  I will recheck her in 3 weeks.  Follow-Up Instructions: Return in about 3 weeks (around 03/26/2019).   Orders:  Orders Placed This Encounter  Procedures  . XR Elbow 2 Views Right   No orders of the defined types were placed in this encounter.     Procedures: No procedures performed   Clinical Data: No additional findings.   Subjective: Chief Complaint  Patient presents with  . Right Elbow - Injury    DOI 03/02/2019    HPI 63 year old female seen for follow-up after a fall and reduction of her right posterior elbow dislocation on 03/02/2019 by ER MD under conscious sedation.  No past history of elbow injury in the past.  She has been staying with a friend and is looking at getting a studio apartment on her own.  She denies numbness or tingling in her hand she does have some itching bruising and swelling as expected post dislocation.  Review of Systems positive for history of gout hypertension hypothyroidism alcohol use sometimes excessive hyperlipidemia, asthma.  History of SVT and atrial flutter otherwise noncontributory to HPI.   Objective: Vital Signs: Ht 5' 9.5" (1.765 m)   Wt 178 lb (80.7 kg)   BMI 25.91 kg/m   Physical Exam Constitutional:      Appearance: She is well-developed.  HENT:   Head: Normocephalic.     Right Ear: External ear normal.     Left Ear: External ear normal.  Eyes:     Pupils: Pupils are equal, round, and reactive to light.  Neck:     Thyroid: No thyromegaly.     Trachea: No tracheal deviation.  Cardiovascular:     Rate and Rhythm: Normal rate.  Pulmonary:     Effort: Pulmonary effort is normal.  Abdominal:     Palpations: Abdomen is soft.  Skin:    General: Skin is warm and dry.  Neurological:     Mental Status: She is alert and oriented to person, place, and time.  Psychiatric:        Behavior: Behavior normal.     Ortho Exam patient has swelling around the elbow there is medial ecchymosis.  Ulnar sensation is intact median sensation is intact normal finger flexion-extension mild finger wrist and forearm swelling post injury.  She has normal supination pronation today.  She has about 30 degrees flexion and 10 degrees to 20 degrees extension from her 90 degrees elbow position.  Specialty Comments:  No specialty comments available.  Imaging: Xr Elbow 2 Views Right  Result Date: 03/05/2019 Lateral x-ray right elbow obtained and reviewed.  This shows concentric reduction of the right elbow ulnohumeral  joint.  Radial head is in good position. Impression: Reduced right elbow in satisfactory position.    PMFS History: Patient Active Problem List   Diagnosis Date Noted  . Elbow dislocation 03/05/2019  . Atrial flutter ([facility]) 07/09/2018  . SVT (supraventricular tachycardia) ([facility]) 06/11/2017  . Cervical high risk HPV (human papillomavirus) test positive 04/01/2017  . Asthma 02/04/2017  . Hyperlipidemia 07/05/2016  . Loss of weight 07/13/2015  . Chronic RUQ pain 07/13/2015  . Diarrhea 07/13/2015  . Smoking 04/08/2014  . IFG (impaired fasting glucose) 04/08/2014  . Arthritis 10/29/2013  . Anxiety and depression 10/29/2013  . Periodic health assessment, general screening, adult 10/29/2013  . Tinnitus 10/29/2013  . Anxiety 06/17/2012  .  Alcohol abuse 06/14/2012  . Gout 06/14/2012  . Hypertension 06/14/2012  . Hypothyroidism 06/14/2012   Past Medical History:  Diagnosis Date  . Anxiety   . Asthma   . Depression   . Gout   . Hypertension   . Hypothyroidism   . Mental disorder   . Thyroid disease     Family History  Problem Relation Age of Onset  . Arthritis Father   . Hypertension Father   . Gout Father   . Hypertension Mother     Past Surgical History:  Procedure Laterality Date  . ABDOMINAL HYSTERECTOMY    . APPENDECTOMY    . CARDIOVERSION N/A 06/16/2017   Procedure: CARDIOVERSION;  Surgeon: Orpah CobbKadakia, Ajay, MD;  [facility];  Service: Cardiovascular;  Laterality: N/A;  . TEE WITHOUT CARDIOVERSION N/A 06/16/2017   Procedure: TRANSESOPHAGEAL ECHOCARDIOGRAM (TEE);  Surgeon: Orpah CobbKadakia, Ajay, MD;  [facility];  Service: Cardiovascular;  Laterality: N/A;   Social History   Occupational History  . Not on file  Tobacco Use  . Smoking status: Current Every Day Smoker    Packs/day: 1.00    Types: Cigarettes  . Smokeless tobacco: Never Used  Substance and Sexual Activity  . Alcohol use: No    Alcohol/week: 4.0 - 7.0 standard drinks    Types: 1 - 3 Glasses of wine, 3 - 4 Cans of beer per week    Frequency: Never    Comment: 3 times weekly  . Drug use: No  . Sexual activity: Not Currently    Birth control/protection: Surgical

## 2019-03-09 ENCOUNTER — Telehealth: Payer: Self-pay | Admitting: Orthopaedic Surgery

## 2019-03-09 NOTE — Telephone Encounter (Signed)
Patient called asked if she can get a Rx for Tramadol call into the [facility]   The number to contact patient is [phone]

## 2019-03-10 ENCOUNTER — Other Ambulatory Visit: Payer: Self-pay | Admitting: Radiology

## 2019-03-10 MED ORDER — TRAMADOL HCL 50 MG PO TABS: ORAL_TABLET | ORAL | 0 refills | Status: DC

## 2019-03-10 MED FILL — traMADol HCL 50 MG TABS: 50 | 15 days supply | Qty: 30 | Fill #0

## 2019-03-10 NOTE — Telephone Encounter (Signed)
Ucall. OK to do .

## 2019-03-10 NOTE — Telephone Encounter (Signed)
Called into pharmacy

## 2019-03-10 NOTE — Telephone Encounter (Signed)
LEFT MESSAGE FOR PATIENT

## 2019-03-10 NOTE — Telephone Encounter (Signed)
Please advise 

## 2019-03-29 ENCOUNTER — Other Ambulatory Visit: Payer: Self-pay | Admitting: Family Medicine

## 2019-03-29 DIAGNOSIS — F17200 Nicotine dependence, unspecified, uncomplicated: Secondary | ICD-10-CM

## 2019-03-29 MED FILL — SERTRALINE HCL 100 MG TAB: 100 | 30 days supply | Qty: 30 | Fill #5

## 2019-03-29 MED FILL — LOSARTAN POTASSIUM 50 MG TA: 50 | 30 days supply | Qty: 30 | Fill #5

## 2019-03-29 MED FILL — $ELIQUIS 5 MG TABLET: 5 | 90 days supply | Qty: 180 | Fill #1

## 2019-03-29 MED FILL — BUSPIRONE HCL 7.5 MG TABS: 7.5 | 30 days supply | Qty: 60 | Fill #3

## 2019-03-29 MED FILL — ATORVASTATIN 20 MG TABLET: 20 | 30 days supply | Qty: 30 | Fill #5

## 2019-03-29 MED FILL — LEVOTHYROXINE 125 MCG TAB: 125 | 30 days supply | Qty: 30 | Fill #2

## 2019-03-29 MED FILL — METOPROLOL TARTRATE 50 MG T: 50 | 30 days supply | Qty: 60 | Fill #4

## 2019-03-30 ENCOUNTER — Ambulatory Visit: Payer: Medicare Other | Admitting: Orthopaedic Surgery

## 2019-04-20 ENCOUNTER — Encounter: Payer: Self-pay | Admitting: Family Medicine

## 2019-04-20 ENCOUNTER — Other Ambulatory Visit: Payer: Self-pay

## 2019-04-20 ENCOUNTER — Ambulatory Visit: Payer: Medicare Other | Attending: Family Medicine | Admitting: Family Medicine

## 2019-04-20 VITALS — BP 115/74 | HR 78 | Temp 98.2°F | Ht 69.5 in | Wt 165.0 lb

## 2019-04-20 DIAGNOSIS — Z23 Encounter for immunization: Secondary | ICD-10-CM | POA: Diagnosis not present

## 2019-04-20 DIAGNOSIS — J45909 Unspecified asthma, uncomplicated: Secondary | ICD-10-CM | POA: Diagnosis not present

## 2019-04-20 DIAGNOSIS — I10 Essential (primary) hypertension: Secondary | ICD-10-CM | POA: Insufficient documentation

## 2019-04-20 DIAGNOSIS — M25521 Pain in right elbow: Secondary | ICD-10-CM | POA: Insufficient documentation

## 2019-04-20 DIAGNOSIS — M109 Gout, unspecified: Secondary | ICD-10-CM | POA: Insufficient documentation

## 2019-04-20 DIAGNOSIS — I483 Typical atrial flutter: Secondary | ICD-10-CM | POA: Diagnosis not present

## 2019-04-20 DIAGNOSIS — E7800 Pure hypercholesterolemia, unspecified: Secondary | ICD-10-CM | POA: Insufficient documentation

## 2019-04-20 DIAGNOSIS — E039 Hypothyroidism, unspecified: Secondary | ICD-10-CM | POA: Insufficient documentation

## 2019-04-20 DIAGNOSIS — Z7901 Long term (current) use of anticoagulants: Secondary | ICD-10-CM | POA: Insufficient documentation

## 2019-04-20 DIAGNOSIS — Z79890 Hormone replacement therapy (postmenopausal): Secondary | ICD-10-CM | POA: Diagnosis not present

## 2019-04-20 DIAGNOSIS — Z79899 Other long term (current) drug therapy: Secondary | ICD-10-CM | POA: Diagnosis not present

## 2019-04-20 DIAGNOSIS — F17210 Nicotine dependence, cigarettes, uncomplicated: Secondary | ICD-10-CM | POA: Diagnosis not present

## 2019-04-20 DIAGNOSIS — F419 Anxiety disorder, unspecified: Secondary | ICD-10-CM | POA: Insufficient documentation

## 2019-04-20 DIAGNOSIS — F329 Major depressive disorder, single episode, unspecified: Secondary | ICD-10-CM | POA: Diagnosis not present

## 2019-04-20 DIAGNOSIS — F17200 Nicotine dependence, unspecified, uncomplicated: Secondary | ICD-10-CM | POA: Diagnosis not present

## 2019-04-20 MED ORDER — DICLOFENAC SODIUM 1 % TD GEL: 4.0000 g | Freq: Four times a day (QID) | TRANSDERMAL | 1 refills | Status: DC

## 2019-04-20 MED ORDER — SERTRALINE HCL 100 MG PO TABS: ORAL_TABLET | ORAL | 6 refills | Status: DC

## 2019-04-20 MED ORDER — METOPROLOL TARTRATE 50 MG PO TABS: 50.0000 mg | ORAL_TABLET | Freq: Two times a day (BID) | ORAL | 6 refills | Status: DC

## 2019-04-20 MED ORDER — NICOTINE 21 MG/24HR TD PT24: MEDICATED_PATCH | TRANSDERMAL | 2 refills | Status: DC

## 2019-04-20 MED ORDER — COLCHICINE 0.6 MG PO TABS: ORAL_TABLET | ORAL | 1 refills | Status: DC

## 2019-04-20 MED ORDER — BUSPIRONE HCL 7.5 MG PO TABS: 7.5000 mg | ORAL_TABLET | Freq: Two times a day (BID) | ORAL | 6 refills | Status: DC

## 2019-04-20 MED ORDER — LOSARTAN POTASSIUM 50 MG PO TABS: 50.0000 mg | ORAL_TABLET | Freq: Every day | ORAL | 6 refills | Status: DC

## 2019-04-20 MED ORDER — BUPROPION HCL ER (SR) 150 MG PO TB12: 150.0000 mg | ORAL_TABLET | Freq: Two times a day (BID) | ORAL | 3 refills | Status: DC

## 2019-04-20 MED ORDER — ATORVASTATIN CALCIUM 20 MG PO TABS: 20.0000 mg | ORAL_TABLET | Freq: Every day | ORAL | 6 refills | Status: DC

## 2019-04-20 MED ORDER — APIXABAN 5 MG PO TABS: 5.0000 mg | ORAL_TABLET | Freq: Two times a day (BID) | ORAL | 6 refills | Status: DC

## 2019-04-20 MED FILL — DICLOFENAC SODIUM 1% GEL: 1 | 6 days supply | Qty: 100 | Fill #0

## 2019-04-20 MED FILL — NICOTINE 21 MG/24HR PATCH: 21 | 28 days supply | Qty: 28 | Fill #0

## 2019-04-20 MED FILL — BUPROPION SR 150 MG TABLET: 150 | 30 days supply | Qty: 60 | Fill #0

## 2019-04-20 MED FILL — !COLCRYS 0.6 MG TABLET: 0.6 MG | 30 days supply | Qty: 30 | Fill #0

## 2019-04-20 NOTE — Progress Notes (Signed)
Subjective:  Patient ID: Anita Russell, female    DOB: May 13, 1956  Age: 63 y.o. MRN: [id number]  CC: Hypothyroidism   HPI Anita Russell  is a 63 year old female with a history of depression and anxiety, hypothyroidism, hypertension, Hyperlipidemia, SVT, atrial flutter (status post TEE cardioversion in 05/2017) who comes into the clinic for a follow-up visit. She complains of right elbow pain after dislocating her elbow last month after a fall and was seen by orthopedics-Dr.Yates 03/05/2019.  Treated with posterior splint for 2 weeks but was not able to follow-up. Pain is described as shooting pain which occurs whenever she leans on it. She has been able to carry grocery bags but sometimes feels her elbow go cold and denies numbness or tingling.  She currently smokes anywhere from 16 to 25 cigarettes/day which was exacerbated by her previous living situation but she has moved now and is willing to work on quitting.  Her anxiety and depression has also improved since relocation. Compliant with her antihypertensive and levothyroxine which she takes for hypothyroidism. She has been to see her cardiologist Dr. Terrence Dupont recently.  Denies chest pain, palpitations, irregular heartbeats, dyspnea.  Past Medical History:  Diagnosis Date  . Anxiety   . Asthma   . Depression   . Gout   . Hypertension   . Hypothyroidism   . Mental disorder   . Thyroid disease     Past Surgical History:  Procedure Laterality Date  . ABDOMINAL HYSTERECTOMY    . APPENDECTOMY    . CARDIOVERSION N/A 06/16/2017   Procedure: CARDIOVERSION;  Surgeon: Dixie Dials, MD;  Location: [facility] ENDOSCOPY;  Service: Cardiovascular;  Laterality: N/A;  . TEE WITHOUT CARDIOVERSION N/A 06/16/2017   Procedure: TRANSESOPHAGEAL ECHOCARDIOGRAM (TEE);  Surgeon: Dixie Dials, MD;  Location: [facility] ENDOSCOPY;  Service: Cardiovascular;  Laterality: N/A;    Family History  Problem Relation Age of Onset  . Arthritis Father   .  Hypertension Father   . Gout Father   . Hypertension Mother     Allergies  Allergen Reactions  . Hyzaar [Losartan Potassium-Hctz] Other (See Comments)    Caused patient to feel light-headed and gave her vertigo  . Meloxicam Rash    A "very bad" rash    Outpatient Medications Prior to Visit  Medication Sig Dispense Refill  . albuterol (PROVENTIL HFA;VENTOLIN HFA) 108 (90 Base) MCG/ACT inhaler Inhale 1-2 puffs into the lungs every 6 (six) hours as needed for wheezing or shortness of breath. 1 Inhaler 0  . amiodarone (PACERONE) 200 MG tablet Take 1 tablet (200 mg total) daily by mouth. 30 tablet 3  . levothyroxine (SYNTHROID, LEVOTHROID) 125 MCG tablet Take 1 tablet (125 mcg total) by mouth daily before breakfast. 30 tablet 3  . apixaban (ELIQUIS) 5 MG TABS tablet Take 1 tablet (5 mg total) by mouth 2 (two) times daily. 60 tablet 6  . atorvastatin (LIPITOR) 20 MG tablet Take 1 tablet (20 mg total) by mouth daily. 30 tablet 6  . busPIRone (BUSPAR) 7.5 MG tablet Take 1 tablet (7.5 mg total) by mouth 2 (two) times daily. 60 tablet 3  . colchicine 0.6 MG tablet Take 2 tablets at the start of a gout attack. Then 1 more tablet an hour later. Do not repeat this dosing for 2 days. (Patient taking differently: Take 0.12 mg by mouth daily as needed (gout pain). Take 2 tablets at the start of a gout attack. Then 1 more tablet an hour later. Do not repeat this dosing for 2 days.)  30 tablet 1  . losartan (COZAAR) 50 MG tablet Take 1 tablet (50 mg total) by mouth daily. 30 tablet 6  . metoprolol tartrate (LOPRESSOR) 50 MG tablet Take 1 tablet (50 mg total) by mouth 2 (two) times daily. 180 tablet 6  . sertraline (ZOLOFT) 100 MG tablet TAKE 1 TABLET BY MOUTH ONCE DAILY FOR ANXIETY AND DEPRESSION 30 tablet 6  . acetaminophen (TYLENOL) 500 MG tablet Take 2 tablets (1,000 mg total) by mouth every 8 (eight) hours as needed for mild pain, moderate pain or headache. (Patient not taking: Reported on 03/02/2019) 30  tablet 0  . fluticasone (FLONASE) 50 MCG/ACT nasal spray Place 2 sprays into both nostrils daily. (Patient not taking: Reported on 03/02/2019) 16 g 6  . hydrocortisone 2.5 % cream Apply topically 2 (two) times daily. (Patient not taking: Reported on 04/20/2019) 30 g 2  . oxyCODONE-acetaminophen (PERCOCET/ROXICET) 5-325 MG tablet Take 1 tablet by mouth every 4 (four) hours as needed for severe pain. (Patient not taking: Reported on 04/20/2019) 12 tablet 0  . traMADol (ULTRAM) 50 MG tablet 1 PO BID PRN (Patient not taking: Reported on 04/20/2019) 30 tablet 0  . nicotine (NICODERM CQ - DOSED IN MG/24 HOURS) 21 mg/24hr patch PLACE 1 PATCH (21 MG TOTAL) ONTO THE SKIN DAILY FOR 1 MONTH (Patient not taking: Reported on 04/20/2019) 28 patch 2   No facility-administered medications prior to visit.      ROS Review of Systems  Constitutional: Negative for activity change, appetite change and fatigue.  HENT: Negative for congestion, sinus pressure and sore throat.   Eyes: Negative for visual disturbance.  Respiratory: Negative for cough, chest tightness, shortness of breath and wheezing.   Cardiovascular: Negative for chest pain and palpitations.  Gastrointestinal: Negative for abdominal distention, abdominal pain and constipation.  Endocrine: Negative for polydipsia.  Genitourinary: Negative for dysuria and frequency.  Musculoskeletal:       See HPI  Skin: Negative for rash.  Neurological: Negative for tremors, light-headedness and numbness.  Hematological: Does not bruise/bleed easily.  Psychiatric/Behavioral: Negative for agitation and behavioral problems.    Objective:  BP 115/74   Pulse 78   Temp 98.2 F (36.8 C) (Oral)   Ht 5' 9.5" (1.765 m)   Wt 165 lb (74.8 kg)   SpO2 98%   BMI 24.02 kg/m   BP/Weight 04/20/2019 5/88/5027 02/26/1286  Systolic BP 867 - 672  Diastolic BP 74 - 99  Wt. (Lbs) 165 178 178  BMI 24.02 25.91 26.29  Some encounter information is confidential and restricted. Go  to Review Flowsheets activity to see all data.      Physical Exam Constitutional:      Appearance: She is well-developed.  Cardiovascular:     Rate and Rhythm: Normal rate.     Heart sounds: Normal heart sounds. No murmur.  Pulmonary:     Effort: Pulmonary effort is normal.     Breath sounds: Normal breath sounds. No wheezing or rales.  Chest:     Chest wall: No tenderness.  Abdominal:     General: Bowel sounds are normal. There is no distension.     Palpations: Abdomen is soft. There is no mass.     Tenderness: There is no abdominal tenderness.  Musculoskeletal:     Comments: Slight tenderness on palpation of right elbow  Neurological:     Mental Status: She is alert and oriented to Russell, place, and time.  Psychiatric:        Mood and  Affect: Mood normal.     CMP Latest Ref Rng & Units 04/22/2018 07/02/2017 06/14/2017  Glucose 65 - 99 mg/dL 90 87 99  BUN 8 - 27 mg/dL _0 Creatinine 0.57 - 1.00 mg/dL 1.03(H) 0.78 0.89  Sodium 134 - 144 mmol/L 139 139 138  Potassium 3.5 - 5.2 mmol/L 4.5 4.7 4.2  Chloride 96 - 106 mmol/L 99 100 100(L)  CO2 20 - 29 mmol/L _1 Calcium 8.7 - 10.3 mg/dL 9.5 9.3 9.2  Total Protein 6.0 - 8.5 g/dL 7.5 - -  Total Bilirubin 0.0 - 1.2 mg/dL 0.3 - -  Alkaline Phos 39 - 117 IU/L 83 - -  AST 0 - 40 IU/L 26 - -  ALT 0 - 32 IU/L 15 - -    Lipid Panel     Component Value Date/Time   CHOL 215 (H) 04/22/2018 0922   TRIG 214 (H) 04/22/2018 0922   HDL 82 04/22/2018 0922   CHOLHDL 2.6 04/22/2018 0922   CHOLHDL 3.5 06/12/2017 0418   VLDL 42 (H) 06/12/2017 0418   LDLCALC 90 04/22/2018 0922    CBC    Component Value Date/Time   WBC 6.5 03/02/2019 1944   RBC 4.66 03/02/2019 1944   HGB 14.0 03/02/2019 1944   HGB 12.8 07/02/2017 1045   HCT 43.6 03/02/2019 1944   HCT 38.7 07/02/2017 1045   PLT 180 03/02/2019 1944   PLT 236 07/02/2017 1045   MCV 93.6 03/02/2019 1944   MCV 91 07/02/2017 1045   MCH 30.0 03/02/2019 1944   MCHC 32.1  03/02/2019 1944   RDW 13.2 03/02/2019 1944   RDW 13.8 07/02/2017 1045   LYMPHSABS 1.0 03/02/2019 1944   LYMPHSABS 1.5 07/02/2017 1045   MONOABS 0.5 03/02/2019 1944   EOSABS 0.1 03/02/2019 1944   EOSABS 0.3 07/02/2017 1045   BASOSABS 0.0 03/02/2019 1944   BASOSABS 0.0 07/02/2017 1045    Lab Results  Component Value Date   HGBA1C 5.0 01/05/2015    Assessment & Plan:   1. Smoking Spent 3 minutes counseling on cessation and she is willing to work on quitting Wellbutrin added to regimen - nicotine (NICODERM CQ - DOSED IN MG/24 HOURS) 21 mg/24hr patch; PLACE 1 PATCH (21 MG TOTAL) ONTO THE SKIN DAILY  Dispense: 28 patch; Refill: 2  2. Anxiety and depression Controlled - sertraline (ZOLOFT) 100 MG tablet; TAKE 1 TABLET BY MOUTH ONCE DAILY FOR ANXIETY AND DEPRESSION  Dispense: 30 tablet; Refill: 6 - busPIRone (BUSPAR) 7.5 MG tablet; Take 1 tablet (7.5 mg total) by mouth 2 (two) times daily.  Dispense: 60 tablet; Refill: 6  3. Pure hypercholesterolemia Controlled Low-cholesterol diet - atorvastatin (LIPITOR) 20 MG tablet; Take 1 tablet (20 mg total) by mouth daily.  Dispense: 30 tablet; Refill: 6  4. Essential hypertension Controlled Counseled on blood pressure goal of less than 130/80, low-sodium, DASH diet, medication compliance, 150 minutes of moderate intensity exercise per week. Discussed medication compliance, adverse effects. - CMP14+EGFR - metoprolol tartrate (LOPRESSOR) 50 MG tablet; Take 1 tablet (50 mg total) by mouth 2 (two) times daily.  Dispense: 180 tablet; Refill: 6 - losartan (COZAAR) 50 MG tablet; Take 1 tablet (50 mg total) by mouth daily.  Dispense: 30 tablet; Refill: 6  5. Acquired hypothyroidism We will send of thyroid panel and refill levothyroxine accordingly - T4, free - TSH  6. Right elbow pain Secondary to right elbow dislocation Topical NSAID as she is on an anticoagulant If symptoms  persist, she will need to follow-up with her orthopedics-Dr. Lorin Mercy  - diclofenac sodium (VOLTAREN) 1 % GEL; Apply 4 g topically 4 (four) times daily.  Dispense: 100 g; Refill: 1  7. Typical atrial flutter (HCC) Currently in sinus rhythm - apixaban (ELIQUIS) 5 MG TABS tablet; Take 1 tablet (5 mg total) by mouth 2 (two) times daily.  Dispense: 60 tablet; Refill: 6   Health care maintenance-she is due for colonoscopy and I had referred her in 09/2018 and notes in her chart reveals she was to call back to reschedule her appointment.  Provided with the number to [facility] to reschedule. Meds ordered this encounter  Medications  . diclofenac sodium (VOLTAREN) 1 % GEL    Sig: Apply 4 g topically 4 (four) times daily.    Dispense:  100 g    Refill:  1  . colchicine 0.6 MG tablet    Sig: Take 2 tablets at the start of a gout attack. Then 1 more tablet an hour later. Do not repeat this dosing for 2 days.    Dispense:  30 tablet    Refill:  1  . nicotine (NICODERM CQ - DOSED IN MG/24 HOURS) 21 mg/24hr patch    Sig: PLACE 1 PATCH (21 MG TOTAL) ONTO THE SKIN DAILY    Dispense:  28 patch    Refill:  2  . buPROPion (WELLBUTRIN SR) 150 MG 12 hr tablet    Sig: Take 1 tablet (150 mg total) by mouth 2 (two) times daily. For smoking cessation    Dispense:  60 tablet    Refill:  3  . sertraline (ZOLOFT) 100 MG tablet    Sig: TAKE 1 TABLET BY MOUTH ONCE DAILY FOR ANXIETY AND DEPRESSION    Dispense:  30 tablet    Refill:  6  . atorvastatin (LIPITOR) 20 MG tablet    Sig: Take 1 tablet (20 mg total) by mouth daily.    Dispense:  30 tablet    Refill:  6  . apixaban (ELIQUIS) 5 MG TABS tablet    Sig: Take 1 tablet (5 mg total) by mouth 2 (two) times daily.    Dispense:  60 tablet    Refill:  6  . metoprolol tartrate (LOPRESSOR) 50 MG tablet    Sig: Take 1 tablet (50 mg total) by mouth 2 (two) times daily.    Dispense:  180 tablet    Refill:  6  . losartan (COZAAR) 50 MG tablet    Sig: Take 1 tablet (50 mg total) by mouth daily.    Dispense:  30 tablet    Refill:   6  . busPIRone (BUSPAR) 7.5 MG tablet    Sig: Take 1 tablet (7.5 mg total) by mouth 2 (two) times daily.    Dispense:  60 tablet    Refill:  6    Follow-up: Return in about 6 months (around 10/21/2019) for medical conditions.       Charlott Rakes, MD, FAAFP. [facility], [place]   04/20/2019, 4:27 PM

## 2019-04-20 NOTE — Progress Notes (Signed)
Patient is having pain in her right elbow.

## 2019-04-21 LAB — CMP14+EGFR
ALT: 26 IU/L (ref 0–32)
AST: 35 IU/L (ref 0–40)
Albumin/Globulin Ratio: 1.4 (ref 1.2–2.2)
Albumin: 4 g/dL (ref 3.8–4.8)
Alkaline Phosphatase: 97 IU/L (ref 39–117)
BUN/Creatinine Ratio: 15 (ref 12–28)
BUN: 11 mg/dL (ref 8–27)
Bilirubin Total: 0.4 mg/dL (ref 0.0–1.2)
CO2: 22 mmol/L (ref 20–29)
Calcium: 9.1 mg/dL (ref 8.7–10.3)
Chloride: 99 mmol/L (ref 96–106)
Creatinine, Ser: 0.75 mg/dL (ref 0.57–1.00)
GFR calc Af Amer: 98 mL/min/{1.73_m2} (ref >59)
GFR calc non Af Amer: 85 mL/min/{1.73_m2} (ref >59)
Globulin, Total: 2.9 g/dL (ref 1.5–4.5)
Glucose: 104 mg/dL — ABNORMAL HIGH (ref 65–99)
Potassium: 4.7 mmol/L (ref 3.5–5.2)
Sodium: 139 mmol/L (ref 134–144)
Total Protein: 6.9 g/dL (ref 6.0–8.5)

## 2019-04-27 MED FILL — METOPROLOL TARTRATE 50 MG T: 50 | 30 days supply | Qty: 60 | Fill #5

## 2019-04-27 MED FILL — SERTRALINE HCL 100 MG TAB: 100 | 30 days supply | Qty: 30 | Fill #6

## 2019-04-27 MED FILL — BUSPIRONE HCL 7.5 MG TABS: 7.5 | 30 days supply | Qty: 60 | Fill #0

## 2019-04-27 MED FILL — LEVOTHYROXINE 125 MCG TAB: 125 | 30 days supply | Qty: 30 | Fill #3

## 2019-04-27 MED FILL — LOSARTAN POTASSIUM 50 MG TA: 50 | 30 days supply | Qty: 30 | Fill #6

## 2019-04-27 MED FILL — ATORVASTATIN 20 MG TABLET: 20 | 30 days supply | Qty: 30 | Fill #6

## 2019-04-28 ENCOUNTER — Telehealth: Payer: Self-pay

## 2019-04-28 NOTE — Telephone Encounter (Signed)
-----   Message from Charlott Rakes, MD sent at 04/22/2019  2:05 PM EDT ----- Please inform the patient that labs are normal. Thank you.

## 2019-04-28 NOTE — Telephone Encounter (Signed)
Patient name and DOB has been verified Patient was informed of lab results. Patient had no questions.  

## 2019-05-28 ENCOUNTER — Other Ambulatory Visit: Payer: Self-pay | Admitting: Family Medicine

## 2019-05-28 DIAGNOSIS — E039 Hypothyroidism, unspecified: Secondary | ICD-10-CM

## 2019-05-28 MED FILL — LOSARTAN POTASSIUM 50 MG TA: 50 | 30 days supply | Qty: 30 | Fill #0

## 2019-05-28 MED FILL — METOPROLOL TARTRATE 50 MG T: 50 | 30 days supply | Qty: 60 | Fill #6

## 2019-05-28 MED FILL — LEVOTHYROXINE 125 MCG TAB: 125 | 30 days supply | Qty: 30 | Fill #0

## 2019-05-28 MED FILL — ATORVASTATIN CALCIUM 20 MG: 20 | 30 days supply | Qty: 30 | Fill #0

## 2019-05-28 MED FILL — SERTRALINE HCL 100 MG TAB: 100 | 30 days supply | Qty: 30 | Fill #0

## 2019-06-02 MED FILL — NICOTINE 21 MG/24HR PATCH: 21 | 28 days supply | Qty: 28 | Fill #0

## 2019-06-08 MED FILL — BUSPIRONE HCL 7.5 MG TABS: 7.5 | 30 days supply | Qty: 60 | Fill #1

## 2019-06-29 MED FILL — ATORVASTATIN CALCIUM 20 MG: 20 | 30 days supply | Qty: 30 | Fill #1

## 2019-06-29 MED FILL — LEVOTHYROXINE 125 MCG TAB: 125 | 30 days supply | Qty: 30 | Fill #1

## 2019-06-29 MED FILL — SERTRALINE HCL 100 MG TAB: 100 | 30 days supply | Qty: 30 | Fill #1

## 2019-06-29 MED FILL — LOSARTAN POTASSIUM 50 MG TA: 50 | 30 days supply | Qty: 30 | Fill #1

## 2019-06-29 MED FILL — !ELIQUIS 5MG TABLET: 5 | 30 days supply | Qty: 60 | Fill #2

## 2019-06-29 MED FILL — METOPROLOL TARTRATE 50 MG T: 50 | 30 days supply | Qty: 60 | Fill #7

## 2019-07-26 MED FILL — LEVOTHYROXINE 125 MCG TAB: 125 | 30 days supply | Qty: 30 | Fill #2

## 2019-07-26 MED FILL — METOPROLOL TARTRATE 50 MG T: 50 | 30 days supply | Qty: 60 | Fill #8

## 2019-07-26 MED FILL — NICOTINE 21 MG/24HR PATCH: 21 | 28 days supply | Qty: 28 | Fill #1

## 2019-07-26 MED FILL — ATORVASTATIN CALCIUM 20 MG: 20 | 30 days supply | Qty: 30 | Fill #2

## 2019-07-26 MED FILL — !ELIQUIS 5MG TABLET: 5 | 30 days supply | Qty: 60 | Fill #0

## 2019-07-26 MED FILL — LOSARTAN POTASSIUM 50 MG TA: 50 | 30 days supply | Qty: 30 | Fill #2

## 2019-07-26 MED FILL — busPIRone HCL 7.5 MG TABS: 7.5 | 30 days supply | Qty: 60 | Fill #2

## 2019-07-26 MED FILL — SERTRALINE HCL 100 MG TAB: 100 | 30 days supply | Qty: 30 | Fill #2

## 2019-08-18 IMAGING — NM NM MYOCAR MULTI W/SPECT W/WALL MOTION & EF
1 series · 6 of 6 positions shown · non-contrast
Comparison: Chest radiographs 06/11/2017.

CLINICAL DATA: Chest pain.  History of hypertension and asthma.

EXAM:
MYOCARDIAL IMAGING WITH SPECT (PHARMACOLOGIC-STRESS)
GATED LEFT VENTRICULAR WALL MOTION STUDY
LEFT VENTRICULAR EJECTION FRACTION
TECHNIQUE: Intravenous infusion of Lexiscan was performed under the supervision
of the Cardiology staff. At peak effect of the drug, 10 mCi Ic-YYm
tetrofosmin was injected intravenously and standard myocardial SPECT
imaging was performed. Quantitative gated imaging was also performed
to evaluate left ventricular wall motion, and estimate left
ventricular ejection fraction.

[Series 1: rest · 6.51mm/px · 6 of 64 frames shown]
[frame 6/64]
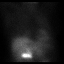
[frame 16/64]
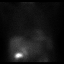
[frame 27/64]
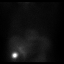
[frame 38/64]
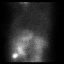
[frame 48/64]
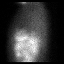
[frame 59/64]
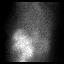

[6 of 6 positions shown; findings below may reference images not displayed]

FINDINGS: Perfusion: No decreased activity in the left ventricle on stress
imaging to suggest reversible ischemia or infarction.

Wall Motion: Normal left ventricular wall motion. No left
ventricular dilation.

Left Ventricular Ejection Fraction: 73 %

End diastolic volume 108 ml

End systolic volume 30 ml
IMPRESSION: 1. No reversible ischemia or infarction.

2. Normal left ventricular wall motion.

3. Left ventricular ejection fraction 73%

4. Non invasive risk stratification*: Low

*6336 Appropriate Use Criteria for Coronary Revascularization
Focused Update: J Am Coll Cardiol. 6336;59(9):857-881.
[URL]

## 2019-08-30 MED FILL — busPIRone HCL 7.5 MG TABS: 7.5 | 30 days supply | Qty: 60 | Fill #3

## 2019-08-30 MED FILL — SERTRALINE HCL 100 MG TAB: 100 | 30 days supply | Qty: 30 | Fill #3

## 2019-08-30 MED FILL — LOSARTAN POTASSIUM 50 MG TA: 50 | 30 days supply | Qty: 30 | Fill #3

## 2019-08-30 MED FILL — ATORVASTATIN CALCIUM 20 MG: 20 | 30 days supply | Qty: 30 | Fill #3

## 2019-08-30 MED FILL — LEVOTHYROXINE 125 MCG TAB: 125 | 30 days supply | Qty: 30 | Fill #3

## 2019-08-30 MED FILL — METOPROLOL TARTRATE 50 MG T: 50 | 30 days supply | Qty: 60 | Fill #9

## 2019-09-07 MED FILL — AMIODARONE HCL 200 MG TAB: 200 | 30 days supply | Qty: 60 | Fill #0

## 2019-09-14 ENCOUNTER — Other Ambulatory Visit: Payer: Self-pay | Admitting: Pharmacist

## 2019-09-14 MED ORDER — ALBUTEROL SULFATE HFA 108 (90 BASE) MCG/ACT IN AERS: 1.0000 | INHALATION_SPRAY | Freq: Four times a day (QID) | RESPIRATORY_TRACT | 0 refills | Status: DC | PRN

## 2019-09-14 MED FILL — ALBUTEROL SULFATE HFA 108 (: 108 (90 BAS | 25 days supply | Qty: 18 | Fill #0

## 2019-09-15 MED FILL — NICOTINE 21 MG/24HR PATCH: 21 | 28 days supply | Qty: 28 | Fill #2

## 2019-10-01 ENCOUNTER — Other Ambulatory Visit: Payer: Self-pay | Admitting: Family Medicine

## 2019-10-01 DIAGNOSIS — E039 Hypothyroidism, unspecified: Secondary | ICD-10-CM

## 2019-10-01 MED FILL — METOPROLOL TARTRATE 50 MG T: 50 | 30 days supply | Qty: 60 | Fill #10

## 2019-10-01 MED FILL — AMIODARONE HCL 200 MG TAB: 200 | 30 days supply | Qty: 60 | Fill #1

## 2019-10-01 MED FILL — ELIQUIS 5 MG TABLET: 5 | 30 days supply | Qty: 60 | Fill #1

## 2019-10-01 MED FILL — SERTRALINE HCL 100 MG TAB: 100 | 30 days supply | Qty: 30 | Fill #4

## 2019-10-01 MED FILL — ATORVASTATIN CALCIUM 20 MG: 20 | 30 days supply | Qty: 30 | Fill #4

## 2019-10-01 MED FILL — busPIRone HCL 7.5 MG TABS: 7.5 | 30 days supply | Qty: 60 | Fill #4

## 2019-10-06 ENCOUNTER — Ambulatory Visit: Payer: Medicare Other

## 2019-10-13 ENCOUNTER — Ambulatory Visit: Payer: Medicare (Managed Care) | Attending: Family Medicine | Admitting: Physician Assistant

## 2019-10-13 DIAGNOSIS — E039 Hypothyroidism, unspecified: Secondary | ICD-10-CM

## 2019-10-13 DIAGNOSIS — E7800 Pure hypercholesterolemia, unspecified: Secondary | ICD-10-CM

## 2019-10-13 DIAGNOSIS — I10 Essential (primary) hypertension: Secondary | ICD-10-CM

## 2019-10-13 DIAGNOSIS — F419 Anxiety disorder, unspecified: Secondary | ICD-10-CM | POA: Diagnosis not present

## 2019-10-13 DIAGNOSIS — F32A Depression, unspecified: Secondary | ICD-10-CM

## 2019-10-13 DIAGNOSIS — F329 Major depressive disorder, single episode, unspecified: Secondary | ICD-10-CM

## 2019-10-13 MED ORDER — LEVOTHYROXINE SODIUM 125 MCG PO TABS: 125.0000 ug | ORAL_TABLET | Freq: Every day | ORAL | 3 refills | Status: DC

## 2019-10-13 MED FILL — LEVOTHYROXINE 125 MCG TAB: 125 | 30 days supply | Qty: 30 | Fill #0

## 2019-10-13 NOTE — Progress Notes (Signed)
Patient ID: Anita Russell, female   DOB: 1955/09/01, 64 y.o.   MRN: [id number] Virtual Visit via Telephone Note  I connected with [name] on 10/13/19 at  2:30 PM EST by telephone and verified that I am speaking with the correct person using two identifiers.   I discussed the limitations, risks, security and privacy concerns of performing an evaluation and management service by telephone and the availability of in person appointments. I also discussed with the patient that there may be a patient responsible charge related to this service. The patient expressed understanding and agreed to proceed.  PATIENT visit by telephone virtually in the context of Covid-19 pandemic. Patient location:  home My Location:  [facility] office Persons on the call:  Me and the patient   History of Present Illness:  Patient needs RF of synthroid.   Out of meds X 2 weeks.  Her cardiologist stopped the losartan.  She has been having some hair loss.  No other symptoms.     Observations/Objective:  NAD   Assessment and Plan: 1. Essential hypertension Follow regimen as prescribed by cardiology- Lipid panel; Future - Comprehensive metabolic panel; Future  2. Acquired hypothyroidism Resume meds - Thyroid Panel With TSH; Future - levothyroxine (SYNTHROID) 125 MCG tablet; Take 1 tablet (125 mcg total) by mouth daily before breakfast.  Dispense: 30 tablet; Refill: 3 - Comprehensive metabolic panel; Future  3. Anxiety and depression Continue current regimen  4. Pure hypercholesterolemia - Lipid panel; Future    Follow Up Instructions: 3 months with PCP   I discussed the assessment and treatment plan with the patient. The patient was provided an opportunity to ask questions and all were answered. The patient agreed with the plan and demonstrated an understanding of the instructions.   The patient was advised to call back or seek an in-person evaluation if the symptoms worsen or if the condition fails  to improve as anticipated.  I provided 7 minutes of non-face-to-face time during this encounter.   Georgian Co, PA-C

## 2019-10-25 ENCOUNTER — Ambulatory Visit: Payer: Medicare (Managed Care) | Attending: Family Medicine

## 2019-10-25 ENCOUNTER — Other Ambulatory Visit: Payer: Self-pay

## 2019-10-25 DIAGNOSIS — E7800 Pure hypercholesterolemia, unspecified: Secondary | ICD-10-CM

## 2019-10-25 DIAGNOSIS — E039 Hypothyroidism, unspecified: Secondary | ICD-10-CM

## 2019-10-25 DIAGNOSIS — I10 Essential (primary) hypertension: Secondary | ICD-10-CM

## 2019-10-26 LAB — COMPREHENSIVE METABOLIC PANEL WITH GFR
ALT: 18 IU/L (ref 0–32)
AST: 35 IU/L (ref 0–40)
Albumin/Globulin Ratio: 1.4 (ref 1.2–2.2)
Albumin: 4.2 g/dL (ref 3.8–4.8)
Alkaline Phosphatase: 104 IU/L (ref 39–117)
BUN/Creatinine Ratio: 6 — ABNORMAL LOW (ref 12–28)
BUN: 5 mg/dL — ABNORMAL LOW (ref 8–27)
Bilirubin Total: 0.4 mg/dL (ref 0.0–1.2)
CO2: 26 mmol/L (ref 20–29)
Calcium: 8.9 mg/dL (ref 8.7–10.3)
Chloride: 97 mmol/L (ref 96–106)
Creatinine, Ser: 0.87 mg/dL (ref 0.57–1.00)
GFR calc Af Amer: 82 mL/min/1.73
GFR calc non Af Amer: 71 mL/min/1.73
Globulin, Total: 2.9 g/dL (ref 1.5–4.5)
Glucose: 92 mg/dL (ref 65–99)
Potassium: 4.4 mmol/L (ref 3.5–5.2)
Sodium: 139 mmol/L (ref 134–144)
Total Protein: 7.1 g/dL (ref 6.0–8.5)

## 2019-10-26 LAB — LIPID PANEL
Chol/HDL Ratio: 1.6 ratio (ref 0.0–4.4)
Cholesterol, Total: 187 mg/dL (ref 100–199)
HDL: 120 mg/dL
LDL Chol Calc (NIH): 40 mg/dL (ref 0–99)
Triglycerides: 176 mg/dL — ABNORMAL HIGH (ref 0–149)
VLDL Cholesterol Cal: 27 mg/dL (ref 5–40)

## 2019-10-26 LAB — THYROID PANEL WITH TSH
Free Thyroxine Index: 3.4 (ref 1.2–4.9)
T3 Uptake Ratio: 33 % (ref 24–39)
T4, Total: 10.2 ug/dL (ref 4.5–12.0)
TSH: 8.53 u[IU]/mL — ABNORMAL HIGH (ref 0.450–4.500)

## 2019-11-02 MED FILL — AMIODARONE HCL 200 MG TAB: 200 | 30 days supply | Qty: 60 | Fill #2

## 2019-11-02 MED FILL — METOPROLOL TARTRATE 50 MG T: 50 | 30 days supply | Qty: 60 | Fill #0

## 2019-11-02 MED FILL — ATORVASTATIN CALCIUM 20 MG: 20 | 30 days supply | Qty: 30 | Fill #5

## 2019-11-02 MED FILL — SERTRALINE HCL 100 MG TAB: 100 | 30 days supply | Qty: 30 | Fill #5

## 2019-11-02 MED FILL — busPIRone HCL 7.5 MG TABS: 7.5 | 30 days supply | Qty: 60 | Fill #5

## 2019-11-02 MED FILL — ELIQUIS 5 MG TABLET: 5 | 30 days supply | Qty: 60 | Fill #2

## 2019-11-04 ENCOUNTER — Ambulatory Visit: Payer: Medicare (Managed Care) | Attending: Family Medicine | Admitting: Licensed Clinical Social Worker

## 2019-11-04 ENCOUNTER — Other Ambulatory Visit: Payer: Self-pay

## 2019-11-04 DIAGNOSIS — F329 Major depressive disorder, single episode, unspecified: Secondary | ICD-10-CM

## 2019-11-04 DIAGNOSIS — F32A Depression, unspecified: Secondary | ICD-10-CM

## 2019-11-05 ENCOUNTER — Telehealth: Payer: Self-pay | Admitting: Licensed Clinical Social Worker

## 2019-11-05 NOTE — BH Specialist Note (Signed)
Integrated Behavioral Health Initial Visit  MRN: [id number] Name: Anita Russell  Number of Integrated Behavioral Health Clinician visits:: 1/6 Session Start time: 2:30pm  Session End time: 3:20pm Total time: 50   Type of Service: Integrated Behavioral Health- Individual Interpretor:No. Interpretor Name and Language: n/a  SUBJECTIVE:  Anita Russell is a 64 y.o. female accompanied by self Patient was referred by Dr. Alvis Lemmings for symptoms of anxiety/depression. Patient reports the following symptoms/concerns: Symptoms of anxiety and depression triggered by atrial fibrillation. Duration of problem: ongoing; Severity of problem: moderate  OBJECTIVE: Mood: NA and Affect: Appropriate Risk of harm to self or others: No plan to harm self or others  LIFE CONTEXT: Family and Social: Patient resides alone. Patient has a daughter who lives in [place] who she is close with. School/Work: Patient is not employed. Self-Care: Patient enjoys hiking and animals. Patient socializes with former roommate. Patient has journaled in the past.  Life Changes: Patient reports low mood triggered by diagnosis of atrial fibrillation in 2019.   GOALS ADDRESSED: Patient will: 1. Reduce symptoms of: anxiety and depression 2. Increase knowledge and/or ability of: coping skills, healthy habits and self-management skills  3. Demonstrate ability to: Increase healthy adjustment to current life circumstances and Increase adequate support systems for patient/family  INTERVENTIONS: Interventions utilized: Solution-Focused Strategies, Behavioral Activation and Supportive Counseling  Standardized Assessments completed: GAD-7 and PHQ 2&9  ASSESSMENT: Patient currently experiencing symptoms of anxiety and depression marked by low motivation and concern about health conditions. Patient identified coping skills utilized in the past to help her overcome similar feelings. Patient agreed to participate in behavioral  activation over the next 3 weeks to improve mood.   Patient may benefit from practicing behavioral activation activities discussed in session and attending behavioral health follow up session at [facility] to monitor progress.   PLAN: 1. Follow up with behavioral health clinician on : 11/25/19 2. Behavioral recommendations: Practice behavioral activation strategies discussed in session (walk 3x / week, journal, socialization) 3. Referral(s): Integrated Behavioral Health Services (In Clinic) 4. "From scale of 1-10, how likely are you to follow plan?":   [name]  MSW Intern  11/05/19 11:04am

## 2019-11-05 NOTE — Telephone Encounter (Signed)
This MSW Intern placed call to patient regarding patient's confusion about Medicare coverage discussed during 11/04/19 behavioral health consult encounter. Patient did not answer, left voicemail requesting return call. This MSW Intern left detailed voicemail asking for patient's consent to provide patient's contact information to Darlyne Russian with [employer].   Albertine Patricia MSW Intern

## 2019-11-17 MED FILL — LEVOTHYROXINE 125 MCG TAB: 125 | 30 days supply | Qty: 30 | Fill #1

## 2019-11-25 ENCOUNTER — Ambulatory Visit: Payer: Medicare (Managed Care) | Admitting: Licensed Clinical Social Worker

## 2019-11-29 MED FILL — AMIODARONE HCL 200 MG TAB: 200 | 30 days supply | Qty: 60 | Fill #0

## 2019-12-03 MED FILL — busPIRone HCL 7.5 MG TABS: 7.5 | 30 days supply | Qty: 60 | Fill #6

## 2019-12-03 MED FILL — SERTRALINE HCL 100 MG TAB: 100 | 30 days supply | Qty: 30 | Fill #6

## 2019-12-03 MED FILL — ELIQUIS 5 MG TABLET: 5 | 30 days supply | Qty: 60 | Fill #3

## 2019-12-03 MED FILL — ATORVASTATIN CALCIUM 20 MG: 20 | 30 days supply | Qty: 30 | Fill #6

## 2019-12-03 MED FILL — METOPROLOL TARTRATE 50 MG T: 50 | 30 days supply | Qty: 60 | Fill #1

## 2019-12-06 ENCOUNTER — Other Ambulatory Visit: Payer: Self-pay | Admitting: Family Medicine

## 2019-12-06 DIAGNOSIS — F17200 Nicotine dependence, unspecified, uncomplicated: Secondary | ICD-10-CM

## 2019-12-06 MED FILL — NICOTINE 21 MG/24HR PATCH: 21 | 28 days supply | Qty: 28 | Fill #1

## 2019-12-07 ENCOUNTER — Ambulatory Visit: Payer: Medicare (Managed Care) | Admitting: Family Medicine

## 2019-12-15 MED FILL — LEVOTHYROXINE 125 MCG TAB: 125 | 30 days supply | Qty: 30 | Fill #2

## 2020-01-05 ENCOUNTER — Other Ambulatory Visit: Payer: Self-pay | Admitting: Family Medicine

## 2020-01-05 DIAGNOSIS — E7800 Pure hypercholesterolemia, unspecified: Secondary | ICD-10-CM

## 2020-01-05 DIAGNOSIS — F32A Depression, unspecified: Secondary | ICD-10-CM

## 2020-01-14 ENCOUNTER — Other Ambulatory Visit: Payer: Self-pay

## 2020-01-14 ENCOUNTER — Encounter: Payer: Self-pay | Admitting: Family

## 2020-01-14 ENCOUNTER — Ambulatory Visit: Payer: Medicare (Managed Care) | Attending: Family | Admitting: Family

## 2020-01-14 VITALS — BP 133/83 | HR 48 | Temp 97.2°F | Resp 16 | Wt 161.2 lb

## 2020-01-14 DIAGNOSIS — Z1211 Encounter for screening for malignant neoplasm of colon: Secondary | ICD-10-CM | POA: Diagnosis not present

## 2020-01-14 DIAGNOSIS — I10 Essential (primary) hypertension: Secondary | ICD-10-CM

## 2020-01-14 DIAGNOSIS — E039 Hypothyroidism, unspecified: Secondary | ICD-10-CM | POA: Diagnosis not present

## 2020-01-14 MED ORDER — LEVOTHYROXINE SODIUM 125 MCG PO TABS: 125.0000 ug | ORAL_TABLET | Freq: Every day | ORAL | 3 refills | Status: DC

## 2020-01-14 NOTE — Progress Notes (Signed)
Patient ID: Anita Russell, female    DOB: [birth year]  MRN: [id number]  CC: Hypertension and thyroid follow-up  Subjective: Anita Russell is a 64 y.o. female with history of hypertension, SVT, atrial flutter, asthma, hypothyroidism, impaired fasting glucose, arthritis, elbow dislocation, gout, alcohol abuse, aniety and depression, tinnitus, loss of weight, chronic RUQ pain, diarrhea, hyperlipidemia, and cervical high risk HPV who presents for hypertension and thyroid follow-up.  1. HYPOTHYROIDISM FOLLOW-UP:  Patient presents for evaluation of thyroid function. Symptoms consist of fatigue, palpitations and diarrhea. Symptoms have present for 8 years. The symptoms are mild. The problem has been stable.  Previous thyroid studies include TSH, thyroxine T4, free thyroxine index, and T3 uptake ratio. Last visit 10/13/2019 with physician assistant Sharon Seller. During that encounter thyroid panel and CMP scheduled for future. Levothyroxine was refilled.  2. HYPERTENSION FOLLOW-UP: Currently taking: see medication list Med Adherence: [x]  Yes    []  No Medication side effects: []  Yes    [x]  No Reports last appointment with cardiologist was 2 weeks ago. Reports she does not need medication refills today because medications were refilled at her cardiology appointment. Last visit 10/13/2019 with physician assistant . During that encounter patient continued on medication regimen as prescribed by cardiology with lipid panel and CMP scheduled for future.  3. COLONOSCOPY REFERRAL REQUEST:  Requesting referral and states it has been 15 years since she last had one.  Patient Active Problem List   Diagnosis Date Noted  . Elbow dislocation 03/05/2019  . Atrial flutter (HCC) 07/09/2018  . SVT (supraventricular tachycardia) (HCC) 06/11/2017  . Cervical high risk HPV (human papillomavirus) test positive 04/01/2017  . Asthma 02/04/2017  . Hyperlipidemia 07/05/2016  . Loss of weight 07/13/2015  .  Chronic RUQ pain 07/13/2015  . Diarrhea 07/13/2015  . Smoking 04/08/2014  . IFG (impaired fasting glucose) 04/08/2014  . Arthritis 10/29/2013  . Anxiety and depression 10/29/2013  . Periodic health assessment, general screening, adult 10/29/2013  . Tinnitus 10/29/2013  . Anxiety 06/17/2012  . Alcohol abuse 06/14/2012  . Gout 06/14/2012  . Hypertension 06/14/2012  . Hypothyroidism 06/14/2012     Current Outpatient Medications on File Prior to Visit  Medication Sig Dispense Refill  . nicotine (NICODERM CQ - DOSED IN MG/24 HOURS) 21 mg/24hr patch PLACE 1 PATCH (21 MG TOTAL) ONTO THE SKIN DAILY FOR 1 MONTH 28 patch 2  . acetaminophen (TYLENOL) 500 MG tablet Take 2 tablets (1,000 mg total) by mouth every 8 (eight) hours as needed for mild pain, moderate pain or headache. (Patient not taking: Reported on 03/02/2019) 30 tablet 0  . albuterol (VENTOLIN HFA) 108 (90 Base) MCG/ACT inhaler Inhale 1-2 puffs into the lungs every 6 (six) hours as needed for wheezing or shortness of breath. 18 g 0  . amiodarone (PACERONE) 200 MG tablet Take 1 tablet (200 mg total) daily by mouth. 30 tablet 3  . apixaban (ELIQUIS) 5 MG TABS tablet Take 1 tablet (5 mg total) by mouth 2 (two) times daily. 60 tablet 6  . atorvastatin (LIPITOR) 20 MG tablet Take 1 tablet (20 mg total) by mouth daily. Please make and keep PCP appointment. 30 tablet 0  . buPROPion (WELLBUTRIN SR) 150 MG 12 hr tablet Take 1 tablet (150 mg total) by mouth 2 (two) times daily. For smoking cessation 60 tablet 3  . busPIRone (BUSPAR) 7.5 MG tablet TAKE 1 TABLET (7.5 MG TOTAL) BY MOUTH 2 (TWO) TIMES DAILY. 60 tablet 0  . colchicine 0.6 MG tablet Take 2 tablets  at the start of a gout attack. Then 1 more tablet an hour later. Do not repeat this dosing for 2 days. 30 tablet 1  . diclofenac sodium (VOLTAREN) 1 % GEL Apply 4 g topically 4 (four) times daily. (Patient not taking: Reported on 01/14/2020) 100 g 1  . fluticasone (FLONASE) 50 MCG/ACT nasal spray  Place 2 sprays into both nostrils daily. (Patient not taking: Reported on 03/02/2019) 16 g 6  . hydrocortisone 2.5 % cream Apply topically 2 (two) times daily. (Patient not taking: Reported on 04/20/2019) 30 g 2  . levothyroxine (SYNTHROID) 125 MCG tablet Take 1 tablet (125 mcg total) by mouth daily before breakfast. 30 tablet 3  . metoprolol tartrate (LOPRESSOR) 50 MG tablet Take 1 tablet (50 mg total) by mouth 2 (two) times daily. 180 tablet 6  . oxyCODONE-acetaminophen (PERCOCET/ROXICET) 5-325 MG tablet Take 1 tablet by mouth every 4 (four) hours as needed for severe pain. (Patient not taking: Reported on 04/20/2019) 12 tablet 0  . sertraline (ZOLOFT) 100 MG tablet TAKE 1 TABLET BY MOUTH ONCE DAILY FOR ANXIETY AND DEPRESSION. Must have office visit for refills 30 tablet 0  . traMADol (ULTRAM) 50 MG tablet 1 PO BID PRN (Patient not taking: Reported on 04/20/2019) 30 tablet 0   No current facility-administered medications on file prior to visit.    Allergies  Allergen Reactions  . Hyzaar [Losartan Potassium-Hctz] Other (See Comments)    Caused patient to feel light-headed and gave her vertigo  . Meloxicam Rash    A "very bad" rash    Social History   Socioeconomic History  . Marital status: Single    Spouse name: Not on file  . Number of children: Not on file  . Years of education: Not on file  . Highest education level: Not on file  Occupational History  . Not on file  Tobacco Use  . Smoking status: Current Every Day Smoker    Packs/day: 1.00    Types: Cigarettes  . Smokeless tobacco: Never Used  Substance and Sexual Activity  . Alcohol use: No    Alcohol/week: 4.0 - 7.0 standard drinks    Types: 1 - 3 Glasses of wine, 3 - 4 Cans of beer per week    Comment: 3 times weekly  . Drug use: No  . Sexual activity: Not Currently    Birth control/protection: Surgical  Other Topics Concern  . Not on file  Social History Narrative  . Not on file   Social Determinants of Health    Financial Resource Strain:   . Difficulty of Paying Living Expenses:   Food Insecurity:   . Worried About Charity fundraiser in the Last Year:   . Arboriculturist in the Last Year:   Transportation Needs:   . Film/video editor (Medical):   Marland Kitchen Lack of Transportation (Non-Medical):   Physical Activity:   . Days of Exercise per Week:   . Minutes of Exercise per Session:   Stress:   . Feeling of Stress :   Social Connections:   . Frequency of Communication with Friends and Family:   . Frequency of Social Gatherings with Friends and Family:   . Attends Religious Services:   . Active Member of Clubs or Organizations:   . Attends Archivist Meetings:   Marland Kitchen Marital Status:   Intimate Partner Violence:   . Fear of Current or Ex-Partner:   . Emotionally Abused:   Marland Kitchen Physically Abused:   .  Sexually Abused:     Family History  Problem Relation Age of Onset  . Arthritis Father   . Hypertension Father   . Gout Father   . Hypertension Mother     Past Surgical History:  Procedure Laterality Date  . ABDOMINAL HYSTERECTOMY    . APPENDECTOMY    . CARDIOVERSION N/A 06/16/2017   Procedure: CARDIOVERSION;  Surgeon: Orpah Cobb, MD;  Location: [facility] ENDOSCOPY;  Service: Cardiovascular;  Laterality: N/A;  . TEE WITHOUT CARDIOVERSION N/A 06/16/2017   Procedure: TRANSESOPHAGEAL ECHOCARDIOGRAM (TEE);  Surgeon: Orpah Cobb, MD;  Location: [facility] ENDOSCOPY;  Service: Cardiovascular;  Laterality: N/A;    ROS: Review of Systems Negative except as stated above  PHYSICAL EXAM: BP 133/83   Pulse (!) 48   Temp (!) 97.2 F (36.2 C)   Resp 16   Wt 161 lb 3.2 oz (73.1 kg)   SpO2 97%   BMI 23.46 kg/m   Physical Exam General appearance - alert, well appearing, and in no distress and oriented to person, place, and time Mental status - alert, oriented to person, place, and time, normal mood, behavior, speech, dress, motor activity, and thought processes Neck - supple, no significant  adenopathy Lymphatics - no palpable lymphadenopathy, no hepatosplenomegaly Chest - clear to auscultation, no wheezes, rales or rhonchi, symmetric air entry, no tachypnea, retractions or cyanosis Heart - normal rate, regular rhythm, normal S1, S2, no murmurs, rubs, clicks or gallops Neurological - alert, oriented, normal speech, no focal findings or movement disorder noted, neck supple without rigidity, cranial nerves II through XII intact, funduscopic exam normal, discs flat and sharp, motor and sensory grossly normal bilaterally, normal muscle tone, no tremors, strength 5/5, Romberg sign negative, normal gait and station  ASSESSMENT AND PLAN: 1. Acquired hypothyroidism: -Continue Levothyroxine as prescribed.  -TSH, T4, free thyroxine index, and T3 uptake ratio obtained 10/25/2019. -Follow-up with primary physician in 3 months or sooner if needed. - levothyroxine (SYNTHROID) 125 MCG tablet; Take 1 tablet (125 mcg total) by mouth daily before breakfast.  Dispense: 30 tablet; Refill: 3  2. Essential Hypertension:  -Continue medications as prescribed by cardiology.  -Keep appointments with cardiology. -Follow-up with primary physician as needed. -Counseled on blood pressure goal of less than 130/80, low-sodium, DASH diet, medication compliance, 150 minutes of moderate intensity exercise per week as tolerated. Discussed medication compliance, adverse effects.  3. Screening for colon cancer: -Referral to Gastroenterology for a colonoscopy as patient reports it has been almost 15 years since she last had one. - Ambulatory referral to Gastroenterology  Patient was given the opportunity to ask questions.  Patient verbalized understanding of the plan and was able to repeat key elements of the plan. Patient was given clear instructions to go to Emergency Department or return to medical center if symptoms don't improve, worsen, or new problems develop.The patient verbalized understanding.  Rema Fendt, NP

## 2020-01-14 NOTE — Patient Instructions (Addendum)
Continue Levothyroxine. Referral for colonoscopy. Hypothyroidism  Hypothyroidism is when the thyroid gland does not make enough of certain hormones (it is underactive). The thyroid gland is a small gland located in the lower front part of the neck, just in front of the windpipe (trachea). This gland makes hormones that help control how the body uses food for energy (metabolism) as well as how the heart and brain function. These hormones also play a role in keeping your bones strong. When the thyroid is underactive, it produces too little of the hormones thyroxine (T4) and triiodothyronine (T3). What are the causes? This condition may be caused by:  Hashimoto's disease. This is a disease in which the body's disease-fighting system (immune system) attacks the thyroid gland. This is the most common cause.  Viral infections.  Pregnancy.  Certain medicines.  Birth defects.  Past radiation treatments to the head or neck for cancer.  Past treatment with radioactive iodine.  Past exposure to radiation in the environment.  Past surgical removal of part or all of the thyroid.  Problems with a gland in the center of the brain (pituitary gland).  Lack of enough iodine in the diet. What increases the risk? You are more likely to develop this condition if:  You are female.  You have a family history of thyroid conditions.  You use a medicine called lithium.  You take medicines that affect the immune system (immunosuppressants). What are the signs or symptoms? Symptoms of this condition include:  Feeling as though you have no energy (lethargy).  Not being able to tolerate cold.  Weight gain that is not explained by a change in diet or exercise habits.  Lack of appetite.  Dry skin.  Coarse hair.  Menstrual irregularity.  Slowing of thought processes.  Constipation.  Sadness or depression. How is this diagnosed? This condition may be diagnosed based on:  Your symptoms,  your medical history, and a physical exam.  Blood tests. You may also have imaging tests, such as an ultrasound or MRI. How is this treated? This condition is treated with medicine that replaces the thyroid hormones that your body does not make. After you begin treatment, it may take several weeks for symptoms to go away. Follow these instructions at home:  Take over-the-counter and prescription medicines only as told by your health care provider.  If you start taking any new medicines, tell your health care provider.  Keep all follow-up visits as told by your health care provider. This is important. ? As your condition improves, your dosage of thyroid hormone medicine may change. ? You will need to have blood tests regularly so that your health care provider can monitor your condition. Contact a health care provider if:  Your symptoms do not get better with treatment.  You are taking thyroid replacement medicine and you: ? Sweat a lot. ? Have tremors. ? Feel anxious. ? Lose weight rapidly. ? Cannot tolerate heat. ? Have emotional swings. ? Have diarrhea. ? Feel weak. Get help right away if you have:  Chest pain.  An irregular heartbeat.  A rapid heartbeat.  Difficulty breathing. Summary  Hypothyroidism is when the thyroid gland does not make enough of certain hormones (it is underactive).  When the thyroid is underactive, it produces too little of the hormones thyroxine (T4) and triiodothyronine (T3).  The most common cause is Hashimoto's disease, a disease in which the body's disease-fighting system (immune system) attacks the thyroid gland. The condition can also be caused by viral  infections, medicine, pregnancy, or past radiation treatment to the head or neck.  Symptoms may include weight gain, dry skin, constipation, feeling as though you do not have energy, and not being able to tolerate cold.  This condition is treated with medicine to replace the thyroid  hormones that your body does not make. This information is not intended to replace advice given to you by your health care provider. Make sure you discuss any questions you have with your health care provider. Document Revised: 07/25/2017 Document Reviewed: 07/23/2017 Elsevier Patient Education  2020 [employer].

## 2020-01-14 NOTE — Progress Notes (Signed)
Pt states she is needing a referral for a colonoscopy  Pt states it has been 20 years since she had one  Pt states she thinks she need to be in a nursing home   Pt is needing all medications refilled

## 2020-01-17 MED FILL — LEVOTHYROXINE 125 MCG TAB: 125 | 30 days supply | Qty: 30 | Fill #0

## 2020-01-18 MED FILL — AMIODARONE HCL 200 MG TAB: 200 | 30 days supply | Qty: 60 | Fill #1

## 2020-02-04 ENCOUNTER — Other Ambulatory Visit: Payer: Self-pay | Admitting: Family Medicine

## 2020-02-04 DIAGNOSIS — E7800 Pure hypercholesterolemia, unspecified: Secondary | ICD-10-CM

## 2020-02-04 DIAGNOSIS — F419 Anxiety disorder, unspecified: Secondary | ICD-10-CM

## 2020-02-07 MED FILL — SERTRALINE HCL 100 MG TAB: 100 | 30 days supply | Qty: 30 | Fill #0

## 2020-02-07 MED FILL — busPIRone HCL 7.5 MG TABS: 7.5 | 30 days supply | Qty: 60 | Fill #0

## 2020-02-07 MED FILL — ATORVASTATIN CALCIUM 20 MG: 20 | 30 days supply | Qty: 30 | Fill #0

## 2020-02-11 ENCOUNTER — Encounter: Payer: Self-pay | Admitting: Family

## 2020-02-21 MED FILL — LEVOTHYROXINE SODIUM 125 MC: 125 | 30 days supply | Qty: 30 | Fill #1

## 2020-03-06 ENCOUNTER — Other Ambulatory Visit: Payer: Self-pay | Admitting: Pharmacist

## 2020-03-06 DIAGNOSIS — E7800 Pure hypercholesterolemia, unspecified: Secondary | ICD-10-CM

## 2020-03-06 MED ORDER — ATORVASTATIN CALCIUM 20 MG PO TABS: ORAL_TABLET | ORAL | 1 refills | Status: DC

## 2020-03-06 MED FILL — ATORVASTATIN CALCIUM 20 MG: 20 | 30 days supply | Qty: 30 | Fill #1

## 2020-03-06 MED FILL — SERTRALINE HCL 100 MG TAB: 100 | 30 days supply | Qty: 30 | Fill #1

## 2020-03-06 MED FILL — busPIRone HCL 7.5 MG TABS: 7.5 | 30 days supply | Qty: 60 | Fill #1

## 2020-03-06 MED FILL — METOPROLOL TARTRATE 50 MG T: 50 | 30 days supply | Qty: 60 | Fill #4

## 2020-03-06 MED FILL — ELIQUIS 5 MG TABLET: 5 | 30 days supply | Qty: 60 | Fill #6

## 2020-03-17 ENCOUNTER — Encounter (HOSPITAL_COMMUNITY): Payer: Self-pay | Admitting: Emergency Medicine

## 2020-03-17 ENCOUNTER — Ambulatory Visit (INDEPENDENT_AMBULATORY_CARE_PROVIDER_SITE_OTHER): Payer: Medicare (Managed Care)

## 2020-03-17 ENCOUNTER — Ambulatory Visit (HOSPITAL_COMMUNITY)
Admission: EM | Admit: 2020-03-17 | Discharge: 2020-03-17 | Disposition: A | Discharge status: Home / Self Care | Payer: Medicare (Managed Care) | Attending: Family Medicine | Admitting: Family Medicine

## 2020-03-17 ENCOUNTER — Other Ambulatory Visit: Payer: Self-pay

## 2020-03-17 ENCOUNTER — Telehealth (HOSPITAL_COMMUNITY): Payer: Self-pay | Admitting: Emergency Medicine

## 2020-03-17 DIAGNOSIS — J441 Chronic obstructive pulmonary disease with (acute) exacerbation: Secondary | ICD-10-CM

## 2020-03-17 DIAGNOSIS — R05 Cough: Secondary | ICD-10-CM

## 2020-03-17 DIAGNOSIS — R062 Wheezing: Secondary | ICD-10-CM

## 2020-03-17 DIAGNOSIS — Z7901 Long term (current) use of anticoagulants: Secondary | ICD-10-CM | POA: Insufficient documentation

## 2020-03-17 DIAGNOSIS — R1032 Left lower quadrant pain: Secondary | ICD-10-CM

## 2020-03-17 DIAGNOSIS — R0602 Shortness of breath: Secondary | ICD-10-CM

## 2020-03-17 LAB — CBC
HCT: 36.8 % (ref 36.0–46.0)
Hemoglobin: 11.8 g/dL — ABNORMAL LOW (ref 12.0–15.0)
MCH: 29 pg (ref 26.0–34.0)
MCHC: 32.1 g/dL (ref 30.0–36.0)
MCV: 90.4 fL (ref 80.0–100.0)
Platelets: 270 10*3/uL (ref 150–400)
RBC: 4.07 MIL/uL (ref 3.87–5.11)
RDW: 15.4 % (ref 11.5–15.5)
WBC: 8.2 10*3/uL (ref 4.0–10.5)
nRBC: 0 % (ref 0.0–0.2)

## 2020-03-17 MED ORDER — BENZONATATE 100 MG PO CAPS: 100.0000 mg | ORAL_CAPSULE | Freq: Three times a day (TID) | ORAL | 0 refills | Status: DC

## 2020-03-17 MED ORDER — DOXYCYCLINE HYCLATE 100 MG PO CAPS: 100.0000 mg | ORAL_CAPSULE | Freq: Two times a day (BID) | ORAL | 0 refills | Status: DC

## 2020-03-17 MED ORDER — PREDNISONE 20 MG PO TABS: 40.0000 mg | ORAL_TABLET | Freq: Every day | ORAL | 0 refills | Status: AC

## 2020-03-17 MED ORDER — PREDNISONE 20 MG PO TABS: 40.0000 mg | ORAL_TABLET | Freq: Every day | ORAL | 0 refills | Status: DC

## 2020-03-17 NOTE — Discharge Instructions (Signed)
I am starting medications to treat your cough.  I feel the pain and bruising is related to muscle injury from coughing.  However, your abdomen may need further imaging if there is no improvement with improvement of your cough.  If you develop worsening of pain to the area, fevers, or continued bruising please go to the ER.

## 2020-03-17 NOTE — Telephone Encounter (Signed)
Pt requested Rx be sent to different pharmacy.

## 2020-03-17 NOTE — ED Triage Notes (Signed)
Pt c/o productive cough x 1.5 weeks. She states she tried several OTC cough remedies with no relief. She states she coughed so much she believes she has pulled a muscle in her groin. She states she has some bruising on the left side near her groin.

## 2020-03-18 NOTE — ED Provider Notes (Signed)
[facility]    CSN: [id number] Arrival date & time: 03/17/20  1418      History   Chief Complaint Chief Complaint  Patient presents with  . Cough  . Groin Pain    HPI Anita Russell is a 64 y.o. female.   Anita Russell presents with complaints of cough with shortness of breath which started approximately 1.5 weeks ago. Cough is occasionally productive. Endorses nasal drainage. No sore throat. No fevers, headache, or body aches. Over the past 4 days it has felt worse, now with pulling sensation to LLQ from coughing. She feels like she pulled a muscle, pain with coughing. She is on a blood thinner related to afib. She smokes. History of asthma, copd? Has an inhaler which has not been helping. No known ill contacts. No other gi symptoms.    ROS per HPI, negative if not otherwise mentioned.      Past Medical History:  Diagnosis Date  . Anxiety   . Asthma   . Depression   . Gout   . Hypertension   . Hypothyroidism   . Mental disorder   . Thyroid disease     Patient Active Problem List   Diagnosis Date Noted  . Elbow dislocation 03/05/2019  . Atrial flutter (HCC) 07/09/2018  . SVT (supraventricular tachycardia) (HCC) 06/11/2017  . Cervical high risk HPV (human papillomavirus) test positive 04/01/2017  . Asthma 02/04/2017  . Hyperlipidemia 07/05/2016  . Loss of weight 07/13/2015  . Chronic RUQ pain 07/13/2015  . Diarrhea 07/13/2015  . Smoking 04/08/2014  . IFG (impaired fasting glucose) 04/08/2014  . Arthritis 10/29/2013  . Anxiety and depression 10/29/2013  . Periodic health assessment, general screening, adult 10/29/2013  . Tinnitus 10/29/2013  . Anxiety 06/17/2012  . Alcohol abuse 06/14/2012  . Gout 06/14/2012  . Hypertension 06/14/2012  . Hypothyroidism 06/14/2012    Past Surgical History:  Procedure Laterality Date  . ABDOMINAL HYSTERECTOMY    . APPENDECTOMY    . CARDIOVERSION N/A 06/16/2017   Procedure: CARDIOVERSION;  Surgeon:  Orpah Cobb, MD;  Location: [facility] ENDOSCOPY;  Service: Cardiovascular;  Laterality: N/A;  . TEE WITHOUT CARDIOVERSION N/A 06/16/2017   Procedure: TRANSESOPHAGEAL ECHOCARDIOGRAM (TEE);  Surgeon: Orpah Cobb, MD;  Location: [facility] ENDOSCOPY;  Service: Cardiovascular;  Laterality: N/A;    OB History    Gravida  2   Para      Term      Preterm      AB  1   Living  1     SAB      TAB      Ectopic      Multiple      Live Births  1            Home Medications    Prior to Admission medications   Medication Sig Start Date End Date Taking? Authorizing Provider  nicotine (NICODERM CQ - DOSED IN MG/24 HOURS) 21 mg/24hr patch PLACE 1 PATCH (21 MG TOTAL) ONTO THE SKIN DAILY FOR 1 MONTH 12/06/19   Hoy Register, MD  acetaminophen (TYLENOL) 500 MG tablet Take 2 tablets (1,000 mg total) by mouth every 8 (eight) hours as needed for mild pain, moderate pain or headache. Patient not taking: Reported on 03/02/2019 02/07/18   Georgetta Haber, NP  albuterol (VENTOLIN HFA) 108 (90 Base) MCG/ACT inhaler Inhale 1-2 puffs into the lungs every 6 (six) hours as needed for wheezing or shortness of breath. 09/14/19   Hoy Register, MD  amiodarone (  PACERONE) 200 MG tablet Take 1 tablet (200 mg total) daily by mouth. 07/02/17   Anders SimmondsMcClung, Angela M, PA-C  apixaban (ELIQUIS) 5 MG TABS tablet Take 1 tablet (5 mg total) by mouth 2 (two) times daily. 04/20/19   Hoy RegisterNewlin, [name], MD  atorvastatin (LIPITOR) 20 MG tablet TAKE 1 TABLET (20 MG TOTAL) BY MOUTH DAILY. 03/06/20   Hoy RegisterNewlin, [name], MD  benzonatate (TESSALON) 100 MG capsule Take 1 capsule (100 mg total) by mouth every 8 (eight) hours. 03/17/20   Georgetta HaberBurky, Blandon Offerdahl B, NP  buPROPion (WELLBUTRIN SR) 150 MG 12 hr tablet Take 1 tablet (150 mg total) by mouth 2 (two) times daily. For smoking cessation 04/20/19   Hoy RegisterNewlin, [name], MD  busPIRone (BUSPAR) 7.5 MG tablet Take 1 tablet (7.5 mg total) by mouth 2 (two) times daily. 02/04/20   Hoy RegisterNewlin, [name], MD  colchicine 0.6  MG tablet Take 2 tablets at the start of a gout attack. Then 1 more tablet an hour later. Do not repeat this dosing for 2 days. 04/20/19   Hoy RegisterNewlin, [name], MD  diclofenac sodium (VOLTAREN) 1 % GEL Apply 4 g topically 4 (four) times daily. Patient not taking: Reported on 01/14/2020 04/20/19   Hoy RegisterNewlin, [name], MD  doxycycline (VIBRAMYCIN) 100 MG capsule Take 1 capsule (100 mg total) by mouth 2 (two) times daily. 03/17/20   Georgetta HaberBurky, Medhansh Brinkmeier B, NP  fluticasone (FLONASE) 50 MCG/ACT nasal spray Place 2 sprays into both nostrils daily. Patient not taking: Reported on 03/02/2019 11/14/17   Hoy RegisterNewlin, [name], MD  hydrocortisone 2.5 % cream Apply topically 2 (two) times daily. Patient not taking: Reported on 04/20/2019 03/11/17   Hoy RegisterNewlin, [name], MD  levothyroxine (SYNTHROID) 125 MCG tablet Take 1 tablet (125 mcg total) by mouth daily before breakfast. 01/14/20   Rema FendtStephens, Amy J, NP  metoprolol tartrate (LOPRESSOR) 50 MG tablet Take 1 tablet (50 mg total) by mouth 2 (two) times daily. 04/20/19   Hoy RegisterNewlin, [name], MD  oxyCODONE-acetaminophen (PERCOCET/ROXICET) 5-325 MG tablet Take 1 tablet by mouth every 4 (four) hours as needed for severe pain. Patient not taking: Reported on 04/20/2019 03/02/19   Tanda RockersVenter, Margaux, PA-C  predniSONE (DELTASONE) 20 MG tablet Take 2 tablets (40 mg total) by mouth daily with breakfast for 5 days. 03/17/20 03/22/20  Linus MakoBurky, Maiana Hennigan B, NP  sertraline (ZOLOFT) 100 MG tablet TAKE 1 TABLET BY MOUTH ONCE DAILY FOR ANXIETY AND DEPRESSION. 02/04/20   Hoy RegisterNewlin, [name], MD  traMADol Janean Sark(ULTRAM) 50 MG tablet 1 PO BID PRN Patient not taking: Reported on 04/20/2019 03/10/19   Eldred MangesYates, Mark C, MD    Family History Family History  Problem Relation Age of Onset  . Arthritis Father   . Hypertension Father   . Gout Father   . Hypertension Mother     Social History Social History   Tobacco Use  . Smoking status: Current Every Day Smoker    Packs/day: 1.00    Types: Cigarettes  . Smokeless tobacco: Never Used   Vaping Use  . Vaping Use: Never used  Substance Use Topics  . Alcohol use: No    Alcohol/week: 4.0 - 7.0 standard drinks    Types: 1 - 3 Glasses of wine, 3 - 4 Cans of beer per week    Comment: 3 times weekly  . Drug use: No     Allergies   Hyzaar [losartan potassium-hctz] and Meloxicam   Review of Systems Review of Systems   Physical Exam Triage Vital Signs ED Triage Vitals  Enc Vitals Group  BP 03/17/20 1555 (!) 142/102     Pulse Rate 03/17/20 1555 57     Resp 03/17/20 1555 15     Temp 03/17/20 1555 98.8 F (37.1 C)     Temp Source 03/17/20 1555 Oral     SpO2 03/17/20 1555 100 %     Weight --      Height --      Head Circumference --      Peak Flow --      Pain Score 03/17/20 1552 4     Pain Loc --      Pain Edu? --      Excl. in GC? --    No data found.  Updated Vital Signs BP (!) 142/102 (BP Location: Left Arm)   Pulse 57   Temp 98.8 F (37.1 C) (Oral)   Resp 15   SpO2 100%    Physical Exam Constitutional:      General: She is not in acute distress.    Appearance: She is well-developed.  Cardiovascular:     Rate and Rhythm: Normal rate.  Pulmonary:     Effort: Pulmonary effort is normal. No tachypnea, accessory muscle usage or respiratory distress.     Breath sounds: Wheezing and rhonchi present.  Abdominal:     Tenderness: There is abdominal tenderness in the left lower quadrant. There is no guarding or rebound.    Skin:    General: Skin is warm and dry.  Neurological:     Mental Status: She is alert and oriented to person, place, and time.      UC Treatments / Results  Labs (all labs ordered are listed, but only abnormal results are displayed) Labs Reviewed  CBC - Abnormal; Notable for the following components:      Result Value   Hemoglobin 11.8 (*)    All other components within normal limits    EKG   Radiology DG Chest 2 View  Result Date: 03/17/2020 CLINICAL DATA:  64 year old female with history of productive  cough, shortness of breath and wheezing for 1 week. EXAM: CHEST - 2 VIEW COMPARISON:  Chest x-ray 03/02/2019. FINDINGS: Lung volumes are normal. No consolidative airspace disease. No pleural effusions. No pneumothorax. No pulmonary nodule or mass noted. Large hiatal hernia. Pulmonary vasculature and the cardiomediastinal silhouette are otherwise within normal limits. IMPRESSION: 1. No radiographic evidence of acute cardiopulmonary disease. 2. Large hiatal hernia. Electronically Signed   By: Trudie Reed M.D.   On: 03/17/2020 16:39    Procedures Procedures (including critical care time)  Medications Ordered in UC Medications - No data to display  Initial Impression / Assessment and Plan / UC Course  I have reviewed the triage vital signs and the nursing notes.  Pertinent labs & imaging results that were available during my care of the patient were reviewed by me and considered in my medical decision making (see chart for details).     Significant cough, chest xray without significant findings. LLQ abdominal pain as well as bruising- denies any known injury or trauma to the area with bruising. She is on a blood thinner. Hernia vs muscle tear vs other abdominal/ peritoneal injury considered. Hemoglobin looks well today at 11.8, although most recent hemoglobin in 2020 was 14. May need imaging of abdomen if no improvement or if worsening of pain to determine source of pain/ bruising. Strict er precautions discussed. Patient verbalized understanding and agreeable to plan.   Final Clinical Impressions(s) / UC Diagnoses   Final  diagnoses:  COPD exacerbation (HCC)  Left lower quadrant abdominal pain  Blood thinned due to long-term anticoagulant use     Discharge Instructions     I am starting medications to treat your cough.  I feel the pain and bruising is related to muscle injury from coughing.  However, your abdomen may need further imaging if there is no improvement with improvement of  your cough.  If you develop worsening of pain to the area, fevers, or continued bruising please go to the ER.    ED Prescriptions    Medication Sig Dispense Auth. Provider   predniSONE (DELTASONE) 20 MG tablet Take 2 tablets (40 mg total) by mouth daily with breakfast for 5 days. 10 tablet Linus Mako B, NP   doxycycline (VIBRAMYCIN) 100 MG capsule Take 1 capsule (100 mg total) by mouth 2 (two) times daily. 20 capsule Linus Mako B, NP   benzonatate (TESSALON) 100 MG capsule Take 1 capsule (100 mg total) by mouth every 8 (eight) hours. 21 capsule Georgetta Haber, NP     PDMP not reviewed this encounter.   Georgetta Haber, NP 03/18/20 1006

## 2020-03-20 MED FILL — AMIODARONE HCL 200 MG TAB: 200 | 30 days supply | Qty: 60 | Fill #3

## 2020-03-20 MED FILL — LEVOTHYROXINE SODIUM 125 MC: 125 | 30 days supply | Qty: 30 | Fill #2

## 2020-03-21 ENCOUNTER — Telehealth (HOSPITAL_COMMUNITY): Payer: Self-pay | Admitting: Emergency Medicine

## 2020-03-21 NOTE — Telephone Encounter (Signed)
Attempted to call patient, no answer, to review information from [place], APP.  LVM

## 2020-03-21 NOTE — Telephone Encounter (Signed)
-----   Message from Georgetta Haber, NP sent at 03/18/2020  9:49 AM EDT ----- Her labs didn't come to my inbox, mainly checking her H&H- she's on a blood thinner with a large bruise to her abdomen with no known trauma except coughing a lot. Her Hemoglobin is just slightly below normal, but compared to her most recent it has had a decent drop (although it was in 2020 last). Considering muscle tear with bleeding vs abdominal hernia related to cough vs unrelated etiology which would need imaging. If you wouldn't mind calling this week to check in with her to see if since starting medication for her cough is her abdomen getting better? If its worsening she needs to be seen for imaging- PCP or ER. Thanks!   Georgetta Haber, NP @ED @ 9:53 AM

## 2020-04-07 ENCOUNTER — Other Ambulatory Visit: Payer: Self-pay | Admitting: Family Medicine

## 2020-04-07 ENCOUNTER — Telehealth: Payer: Self-pay

## 2020-04-07 DIAGNOSIS — I483 Typical atrial flutter: Secondary | ICD-10-CM

## 2020-04-07 MED FILL — busPIRone HCL 7.5 MG TABS: 7.5 | 30 days supply | Qty: 60 | Fill #2

## 2020-04-07 MED FILL — SERTRALINE HCL 100 MG TAB: 100 | 30 days supply | Qty: 30 | Fill #2

## 2020-04-07 MED FILL — METOPROLOL TARTRATE 50 MG T: 50 | 30 days supply | Qty: 60 | Fill #5

## 2020-04-07 MED FILL — ATORVASTATIN CALCIUM 20 MG: 20 | 30 days supply | Qty: 30 | Fill #2

## 2020-04-07 MED FILL — ELIQUIS 5 MG TABLET: 5 | 30 days supply | Qty: 60 | Fill #0

## 2020-04-07 NOTE — Telephone Encounter (Signed)
Pt request cardiologist name be added in her chart: Mohan N. Sharyn Lull, MD at [facility] [phone]

## 2020-04-10 MED FILL — LEVOTHYROXINE SODIUM 100 MC: 100 | 90 days supply | Qty: 90 | Fill #0

## 2020-04-26 MED FILL — AMIODARONE HCL 200 MG TAB: 200 | 30 days supply | Qty: 60 | Fill #0

## 2020-05-08 ENCOUNTER — Other Ambulatory Visit: Payer: Self-pay | Admitting: Family Medicine

## 2020-05-08 DIAGNOSIS — I10 Essential (primary) hypertension: Secondary | ICD-10-CM

## 2020-05-08 MED FILL — ELIQUIS 5 MG TABLET: 5 | 30 days supply | Qty: 60 | Fill #1

## 2020-05-08 MED FILL — ATORVASTATIN CALCIUM 20 MG: 20 | 30 days supply | Qty: 30 | Fill #3

## 2020-05-08 MED FILL — busPIRone HCL 7.5 MG TABS: 7.5 | 30 days supply | Qty: 60 | Fill #3

## 2020-05-08 MED FILL — SERTRALINE HCL 100 MG TAB: 100 | 30 days supply | Qty: 30 | Fill #3

## 2020-05-09 NOTE — Telephone Encounter (Signed)
Looks like patient is establishing care with a Hydrologist per Care Everywhere. Will forward to PCP.

## 2020-10-11 ENCOUNTER — Other Ambulatory Visit: Payer: Self-pay | Admitting: Family Medicine

## 2020-10-11 DIAGNOSIS — E7800 Pure hypercholesterolemia, unspecified: Secondary | ICD-10-CM

## 2020-10-31 ENCOUNTER — Other Ambulatory Visit: Payer: Self-pay | Admitting: Family Medicine

## 2020-10-31 DIAGNOSIS — F32A Depression, unspecified: Secondary | ICD-10-CM

## 2020-10-31 NOTE — Telephone Encounter (Signed)
Called pt and LM on VM to call back and schedule appt for OV 30 day courtesy RF Requested Prescriptions  Pending Prescriptions Disp Refills  . sertraline (ZOLOFT) 100 MG tablet [Pharmacy Med Name: SERTRALINE HCL 100 MG TABLET] 30 tablet 0    Sig: TAKE 1 TABLET BY MOUTH EVERY DAY FOR ANXIETY AND DEPRESSION     Psychiatry:  Antidepressants - SSRI Failed - 10/31/2020 12:30 PM      Failed - Completed PHQ-2 or PHQ-9 in the last 360 days      Failed - Valid encounter within last 6 months    Recent Outpatient Visits          9 months ago Acquired hypothyroidism   [facility] [place], [place], [place]   1 year ago Pure hypercholesterolemia   [facility] [place], [place], [place]   1 year ago Acquired hypothyroidism   [facility] Hoy Register, MD   2 years ago Screening for colon cancer   [facility] Hoy Register, MD   2 years ago Anxiety   [facility] [place], [place], [place]             '

## 2020-11-05 ENCOUNTER — Other Ambulatory Visit: Payer: Self-pay | Admitting: Family Medicine

## 2020-11-05 DIAGNOSIS — E7800 Pure hypercholesterolemia, unspecified: Secondary | ICD-10-CM

## 2020-11-05 NOTE — Telephone Encounter (Signed)
Requested medication (s) are due for refill today: yes  Requested medication (s) are on the active medication list: yes  Last refill:  03/06/20  Future visit scheduled: no  Notes to clinic:  overdue lab work   Requested Prescriptions  Pending Prescriptions Disp Refills   atorvastatin (LIPITOR) 20 MG tablet [Pharmacy Med Name: ATORVASTATIN 20 MG TABLET] 90 tablet 1    Sig: TAKE 1 TABLET BY MOUTH EVERY DAY      Cardiovascular:  Antilipid - Statins Failed - 11/05/2020  4:53 PM      Failed - Total Cholesterol in normal range and within 360 days    Cholesterol, Total  Date Value Ref Range Status  10/25/2019 187 100 - 199 mg/dL Final          Failed - LDL in normal range and within 360 days    LDL Chol Calc (NIH)  Date Value Ref Range Status  10/25/2019 40 0 - 99 mg/dL Final          Failed - HDL in normal range and within 360 days    HDL  Date Value Ref Range Status  10/25/2019 120 >39 mg/dL Final          Failed - Triglycerides in normal range and within 360 days    Triglycerides  Date Value Ref Range Status  10/25/2019 176 (H) 0 - 149 mg/dL Final          Passed - Patient is not pregnant      Passed - Valid encounter within last 12 months    Recent Outpatient Visits           9 months ago Acquired hypothyroidism   [facility] [place], [place], [place]   1 year ago Pure hypercholesterolemia   [place] [address] [facility] [place], [place], [place]   1 year ago Acquired hypothyroidism   [facility] [place], Odette Horns, MD   2 years ago Screening for colon cancer   [facility] [place], Odette Horns, MD   2 years ago Anxiety   [facility] [place], [place], [place]

## 2020-11-27 ENCOUNTER — Other Ambulatory Visit: Payer: Self-pay | Admitting: Family Medicine

## 2020-11-27 DIAGNOSIS — F32A Depression, unspecified: Secondary | ICD-10-CM

## 2020-12-01 ENCOUNTER — Other Ambulatory Visit: Payer: Self-pay | Admitting: Family Medicine

## 2020-12-01 DIAGNOSIS — E7800 Pure hypercholesterolemia, unspecified: Secondary | ICD-10-CM

## 2020-12-01 NOTE — Telephone Encounter (Signed)
Requested medication (s) are due for refill today: no  Requested medication (s) are on the active medication list: yes  Last refill:  11/07/2020  Future visit scheduled: no  Notes to clinic:  vm left for patient to callback  Overdue for appointment    Requested Prescriptions  Pending Prescriptions Disp Refills   atorvastatin (LIPITOR) 20 MG tablet [Pharmacy Med Name: ATORVASTATIN 20 MG TABLET] 30 tablet 0    Sig: TAKE 1 TABLET BY MOUTH EVERY DAY. Must have office visit for refills      Cardiovascular:  Antilipid - Statins Failed - 12/01/2020 11:30 AM      Failed - Total Cholesterol in normal range and within 360 days    Cholesterol, Total  Date Value Ref Range Status  10/25/2019 187 100 - 199 mg/dL Final          Failed - LDL in normal range and within 360 days    LDL Chol Calc (NIH)  Date Value Ref Range Status  10/25/2019 40 0 - 99 mg/dL Final          Failed - HDL in normal range and within 360 days    HDL  Date Value Ref Range Status  10/25/2019 120 >39 mg/dL Final          Failed - Triglycerides in normal range and within 360 days    Triglycerides  Date Value Ref Range Status  10/25/2019 176 (H) 0 - 149 mg/dL Final          Passed - Patient is not pregnant      Passed - Valid encounter within last 12 months    Recent Outpatient Visits           10 months ago Acquired hypothyroidism   [facility] [place], [place], [place]   1 year ago Pure hypercholesterolemia   [facility] [address] [facility] [place], [place], [place]   1 year ago Acquired hypothyroidism   [facility] [place], Odette Horns, MD   2 years ago Screening for colon cancer   [facility] [place], Odette Horns, MD   2 years ago Anxiety   [facility] [place], [place], [place]

## 2020-12-04 ENCOUNTER — Other Ambulatory Visit: Payer: Self-pay | Admitting: Family Medicine

## 2020-12-04 DIAGNOSIS — F419 Anxiety disorder, unspecified: Secondary | ICD-10-CM

## 2020-12-04 DIAGNOSIS — E7800 Pure hypercholesterolemia, unspecified: Secondary | ICD-10-CM

## 2020-12-04 NOTE — Telephone Encounter (Signed)
Patient established with a new pcp on 05/09/20 therefore this request is refused.

## 2020-12-28 ENCOUNTER — Emergency Department (HOSPITAL_COMMUNITY): Payer: Medicare (Managed Care)

## 2020-12-28 ENCOUNTER — Encounter (HOSPITAL_COMMUNITY): Payer: Self-pay

## 2020-12-28 ENCOUNTER — Other Ambulatory Visit: Payer: Self-pay

## 2020-12-28 ENCOUNTER — Inpatient Hospital Stay (HOSPITAL_COMMUNITY)
Admission: EM | Admit: 2020-12-28 | Discharge: 2020-12-30 | DRG: 308 | Disposition: A | Discharge status: Home / Self Care | Payer: Medicare (Managed Care) | Source: Ambulatory Visit | Attending: Internal Medicine | Admitting: Internal Medicine

## 2020-12-28 DIAGNOSIS — E7800 Pure hypercholesterolemia, unspecified: Secondary | ICD-10-CM | POA: Diagnosis not present

## 2020-12-28 DIAGNOSIS — F17210 Nicotine dependence, cigarettes, uncomplicated: Secondary | ICD-10-CM | POA: Diagnosis present

## 2020-12-28 DIAGNOSIS — J45901 Unspecified asthma with (acute) exacerbation: Secondary | ICD-10-CM | POA: Diagnosis present

## 2020-12-28 DIAGNOSIS — Z79899 Other long term (current) drug therapy: Secondary | ICD-10-CM | POA: Diagnosis not present

## 2020-12-28 DIAGNOSIS — I482 Chronic atrial fibrillation, unspecified: Secondary | ICD-10-CM | POA: Diagnosis present

## 2020-12-28 DIAGNOSIS — Z7901 Long term (current) use of anticoagulants: Secondary | ICD-10-CM | POA: Diagnosis not present

## 2020-12-28 DIAGNOSIS — J441 Chronic obstructive pulmonary disease with (acute) exacerbation: Secondary | ICD-10-CM | POA: Diagnosis present

## 2020-12-28 DIAGNOSIS — R296 Repeated falls: Secondary | ICD-10-CM | POA: Diagnosis present

## 2020-12-28 DIAGNOSIS — E039 Hypothyroidism, unspecified: Secondary | ICD-10-CM | POA: Diagnosis present

## 2020-12-28 DIAGNOSIS — Z79890 Hormone replacement therapy (postmenopausal): Secondary | ICD-10-CM | POA: Diagnosis not present

## 2020-12-28 DIAGNOSIS — Z8249 Family history of ischemic heart disease and other diseases of the circulatory system: Secondary | ICD-10-CM | POA: Diagnosis not present

## 2020-12-28 DIAGNOSIS — J9601 Acute respiratory failure with hypoxia: Secondary | ICD-10-CM | POA: Diagnosis present

## 2020-12-28 DIAGNOSIS — I10 Essential (primary) hypertension: Secondary | ICD-10-CM | POA: Diagnosis present

## 2020-12-28 DIAGNOSIS — I4430 Unspecified atrioventricular block: Secondary | ICD-10-CM | POA: Diagnosis present

## 2020-12-28 DIAGNOSIS — Z8261 Family history of arthritis: Secondary | ICD-10-CM | POA: Diagnosis not present

## 2020-12-28 DIAGNOSIS — F419 Anxiety disorder, unspecified: Secondary | ICD-10-CM | POA: Diagnosis present

## 2020-12-28 DIAGNOSIS — J302 Other seasonal allergic rhinitis: Secondary | ICD-10-CM | POA: Diagnosis present

## 2020-12-28 DIAGNOSIS — I4892 Unspecified atrial flutter: Secondary | ICD-10-CM | POA: Diagnosis present

## 2020-12-28 DIAGNOSIS — Z20822 Contact with and (suspected) exposure to covid-19: Secondary | ICD-10-CM | POA: Diagnosis present

## 2020-12-28 DIAGNOSIS — M109 Gout, unspecified: Secondary | ICD-10-CM | POA: Diagnosis present

## 2020-12-28 DIAGNOSIS — F32A Depression, unspecified: Secondary | ICD-10-CM | POA: Diagnosis present

## 2020-12-28 DIAGNOSIS — W19XXXD Unspecified fall, subsequent encounter: Secondary | ICD-10-CM | POA: Diagnosis not present

## 2020-12-28 DIAGNOSIS — E785 Hyperlipidemia, unspecified: Secondary | ICD-10-CM | POA: Diagnosis present

## 2020-12-28 DIAGNOSIS — R9431 Abnormal electrocardiogram [ECG] [EKG]: Secondary | ICD-10-CM | POA: Diagnosis not present

## 2020-12-28 DIAGNOSIS — W19XXXA Unspecified fall, initial encounter: Secondary | ICD-10-CM

## 2020-12-28 LAB — CBC WITH DIFFERENTIAL/PLATELET
Abs Immature Granulocytes: 0.02 10*3/uL (ref 0.00–0.07)
Basophils Absolute: 0 10*3/uL (ref 0.0–0.1)
Basophils Relative: 1 %
Eosinophils Absolute: 0.1 10*3/uL (ref 0.0–0.5)
Eosinophils Relative: 3 %
HCT: 39.1 % (ref 36.0–46.0)
Hemoglobin: 12.4 g/dL (ref 12.0–15.0)
Immature Granulocytes: 1 %
Lymphocytes Relative: 39 %
Lymphs Abs: 1.7 10*3/uL (ref 0.7–4.0)
MCH: 30 pg (ref 26.0–34.0)
MCHC: 31.7 g/dL (ref 30.0–36.0)
MCV: 94.4 fL (ref 80.0–100.0)
Monocytes Absolute: 0.3 10*3/uL (ref 0.1–1.0)
Monocytes Relative: 6 %
Neutro Abs: 2.2 10*3/uL (ref 1.7–7.7)
Neutrophils Relative %: 50 %
Platelets: 210 10*3/uL (ref 150–400)
RBC: 4.14 MIL/uL (ref 3.87–5.11)
RDW: 16.6 % — ABNORMAL HIGH (ref 11.5–15.5)
WBC: 4.4 10*3/uL (ref 4.0–10.5)
nRBC: 0 % (ref 0.0–0.2)

## 2020-12-28 LAB — BASIC METABOLIC PANEL
Anion gap: 13 (ref 5–15)
BUN: 11 mg/dL (ref 8–23)
CO2: 23 mmol/L (ref 22–32)
Calcium: 9 mg/dL (ref 8.9–10.3)
Chloride: 102 mmol/L (ref 98–111)
Creatinine, Ser: 0.91 mg/dL (ref 0.44–1.00)
GFR, Estimated: >60 mL/min (ref >60)
Glucose, Bld: 110 mg/dL — ABNORMAL HIGH (ref 70–99)
Potassium: 4.3 mmol/L (ref 3.5–5.1)
Sodium: 138 mmol/L (ref 135–145)

## 2020-12-28 LAB — RESP PANEL BY RT-PCR (FLU A&B, COVID) ARPGX2
Influenza A by PCR: NEGATIVE
Influenza B by PCR: NEGATIVE
SARS Coronavirus 2 by RT PCR: NEGATIVE

## 2020-12-28 LAB — MAGNESIUM: Magnesium: 1.8 mg/dL (ref 1.7–2.4)

## 2020-12-28 MED ORDER — LEVALBUTEROL HCL 1.25 MG/0.5ML IN NEBU
1.2500 mg | INHALATION_SOLUTION | Freq: Three times a day (TID) | RESPIRATORY_TRACT | Status: DC
  Administered 2020-12-29 – 2020-12-30 (×4): 1.25 mg via RESPIRATORY_TRACT
  Filled 2020-12-28 (×4): qty 0.5

## 2020-12-28 MED ORDER — IPRATROPIUM BROMIDE 0.02 % IN SOLN
0.5000 mg | RESPIRATORY_TRACT | Status: DC
  Administered 2020-12-28: 0.5 mg via RESPIRATORY_TRACT
  Filled 2020-12-28: qty 2.5

## 2020-12-28 MED ORDER — MIRTAZAPINE 30 MG PO TABS: 30.0000 mg | ORAL_TABLET | Freq: Every day | ORAL | Status: DC

## 2020-12-28 MED ORDER — CLONAZEPAM 0.5 MG PO TABS
0.5000 mg | ORAL_TABLET | Freq: Two times a day (BID) | ORAL | Status: DC | PRN
  Administered 2020-12-29 (×2): 0.5 mg via ORAL
  Filled 2020-12-28 (×2): qty 1

## 2020-12-28 MED ORDER — METOPROLOL TARTRATE 25 MG PO TABS
50.0000 mg | ORAL_TABLET | Freq: Once | ORAL | Status: DC
  Filled 2020-12-28: qty 2

## 2020-12-28 MED ORDER — ATORVASTATIN CALCIUM 20 MG PO TABS
20.0000 mg | ORAL_TABLET | Freq: Every day | ORAL | Status: DC
  Administered 2020-12-28 – 2020-12-30 (×3): 20 mg via ORAL
  Filled 2020-12-28 (×3): qty 1

## 2020-12-28 MED ORDER — DILTIAZEM HCL 25 MG/5ML IV SOLN
10.0000 mg | Freq: Once | INTRAVENOUS | Status: AC
  Administered 2020-12-28: 10 mg via INTRAVENOUS
  Filled 2020-12-28: qty 5

## 2020-12-28 MED ORDER — MAGNESIUM OXIDE -MG SUPPLEMENT 400 (240 MG) MG PO TABS
400.0000 mg | ORAL_TABLET | Freq: Once | ORAL | Status: AC
  Administered 2020-12-28: 400 mg via ORAL
  Filled 2020-12-28: qty 1

## 2020-12-28 MED ORDER — METOPROLOL TARTRATE 25 MG PO TABS: 50.0000 mg | ORAL_TABLET | Freq: Once | ORAL | Status: DC

## 2020-12-28 MED ORDER — PREDNISONE 20 MG PO TABS
40.0000 mg | ORAL_TABLET | Freq: Every day | ORAL | Status: DC
  Administered 2020-12-29 – 2020-12-30 (×2): 40 mg via ORAL
  Filled 2020-12-28 (×2): qty 2

## 2020-12-28 MED ORDER — PREDNISONE 20 MG PO TABS
60.0000 mg | ORAL_TABLET | Freq: Once | ORAL | Status: AC
  Administered 2020-12-28: 60 mg via ORAL
  Filled 2020-12-28: qty 3

## 2020-12-28 MED ORDER — APIXABAN 5 MG PO TABS
5.0000 mg | ORAL_TABLET | Freq: Two times a day (BID) | ORAL | Status: DC
  Administered 2020-12-28 – 2020-12-30 (×4): 5 mg via ORAL
  Filled 2020-12-28 (×4): qty 1

## 2020-12-28 MED ORDER — DM-GUAIFENESIN ER 30-600 MG PO TB12
1.0000 | ORAL_TABLET | Freq: Two times a day (BID) | ORAL | Status: DC
  Administered 2020-12-28 – 2020-12-30 (×4): 1 via ORAL
  Filled 2020-12-28 (×5): qty 1

## 2020-12-28 MED ORDER — ACETAMINOPHEN 500 MG PO TABS: 500.0000 mg | ORAL_TABLET | Freq: Four times a day (QID) | ORAL | Status: DC | PRN

## 2020-12-28 MED ORDER — LEVOTHYROXINE SODIUM 100 MCG PO TABS
100.0000 ug | ORAL_TABLET | Freq: Every day | ORAL | Status: DC
  Administered 2020-12-29 – 2020-12-30 (×2): 100 ug via ORAL
  Filled 2020-12-28 (×2): qty 1

## 2020-12-28 MED ORDER — IPRATROPIUM BROMIDE 0.02 % IN SOLN
0.5000 mg | Freq: Three times a day (TID) | RESPIRATORY_TRACT | Status: DC
  Administered 2020-12-29 – 2020-12-30 (×4): 0.5 mg via RESPIRATORY_TRACT
  Filled 2020-12-28 (×4): qty 2.5

## 2020-12-28 MED ORDER — DILTIAZEM HCL-DEXTROSE 125-5 MG/125ML-% IV SOLN (PREMIX)
5.0000 mg/h | INTRAVENOUS | Status: DC
  Administered 2020-12-28: 5 mg/h via INTRAVENOUS
  Administered 2020-12-29: 10 mg/h via INTRAVENOUS
  Administered 2020-12-29: 15 mg/h via INTRAVENOUS
  Filled 2020-12-28 (×6): qty 125

## 2020-12-28 MED ORDER — ALBUTEROL SULFATE HFA 108 (90 BASE) MCG/ACT IN AERS
2.0000 | INHALATION_SPRAY | Freq: Once | RESPIRATORY_TRACT | Status: AC
  Administered 2020-12-28: 2 via RESPIRATORY_TRACT
  Filled 2020-12-28: qty 6.7

## 2020-12-28 MED ORDER — LEVALBUTEROL HCL 1.25 MG/0.5ML IN NEBU
1.2500 mg | INHALATION_SOLUTION | RESPIRATORY_TRACT | Status: DC
  Administered 2020-12-28: 1.25 mg via RESPIRATORY_TRACT
  Filled 2020-12-28: qty 0.5

## 2020-12-28 MED ORDER — IPRATROPIUM BROMIDE HFA 17 MCG/ACT IN AERS
2.0000 | INHALATION_SPRAY | Freq: Once | RESPIRATORY_TRACT | Status: AC
  Administered 2020-12-28: 2 via RESPIRATORY_TRACT
  Filled 2020-12-28: qty 12.9

## 2020-12-28 NOTE — H&P (Signed)
History and Physical    Anita BruinsMaryanne Russell ZOX:[id number] DOB: 01/25/1956 DOA: 12/28/2020  PCP: Hoy RegisterNewlin, [name], MD  Patient coming from: Cardiology office  I have personally briefly reviewed [facility]  Chief Complaint: shortness of breath, chest tightness   HPI: Anita BruinsMaryanne Russell is a 65 y.o. female with medical history significant for atrial flutter/chronic atrial fibrillation on Eliquis, metoprolol and previously amiodarone, asthma/COPD, hypertension, hypothyroid, anxiety, depression hyperlipidemia who presents at the advice of her cardiologist for atrial flutter with RVR.  Patient began to note increasing shortness of breath last week that was triggered by allergies and pollen.  She was having increased cough and sputum production.  Also feeling chest tightness that was associated with her cough and shortness of breath.  She used her inhaler with some relief.  Also noted one day last week that her blood pressure was more elevated in the 160/110 and took extra half dose of her metoprolol.  She also noted palpitation on and off but states that is chronic for her.  Denies any fever.  She presented with the symptoms to her cardiologist today and was found on EKG to have 2-1 atrial flutter with AV block and was advised to present to the ED.  Patient continues to use tobacco but has cut back to about 4 cigarettes a day and has gone some days without smoking any.  She hopes to quit completely soon.  ED Course: She was in atrial flutter with rates up to 120 and was also noted to be hypoxic with ambulation down to 88% and placed on 2 L.  CBC was unremarkable.  Potassium normal at 4.3.  Magnesium mildly low at 1.8. Chest x-ray was negative.  She was given DuoNeb inhaler, 60 mg prednisone and placed on diltiazem drip in the ED.  Hospitalist was called for admission.  Review of Systems: Constitutional: No Weight Change, No Fever ENT/Mouth: No sore throat, No  Rhinorrhea Eyes: No Eye Pain, No Vision Changes Cardiovascular: + Chest Pain, + SOB, No PND, + Dyspnea on Exertion, No Orthopnea,No Edema, +Palpitations Respiratory: + Cough, + Sputum, No Wheezing,+Dyspnea  Gastrointestinal: + Nausea, No Vomiting, No Diarrhea, No Constipation, No Pain Genitourinary: no Urinary Incontinence, No Urgency, No Flank Pain Musculoskeletal: No Arthralgias, No Myalgias Skin: No Skin Lesions, No Pruritus, Neuro: no Weakness, No Numbness Psych: No Anxiety/Panic, No Depression, no decrease appetite Heme/Lymph: No Bruising, No Bleeding  Past Medical History:  Diagnosis Date  . Anxiety   . Asthma   . Depression   . Gout   . Hypertension   . Hypothyroidism   . Mental disorder   . Thyroid disease     Past Surgical History:  Procedure Laterality Date  . ABDOMINAL HYSTERECTOMY    . APPENDECTOMY    . CARDIOVERSION N/A 06/16/2017   Procedure: CARDIOVERSION;  Surgeon: Orpah CobbKadakia, Ajay, MD;  [facility];  Service: Cardiovascular;  Laterality: N/A;  . TEE WITHOUT CARDIOVERSION N/A 06/16/2017   Procedure: TRANSESOPHAGEAL ECHOCARDIOGRAM (TEE);  Surgeon: Orpah CobbKadakia, Ajay, MD;  [facility];  Service: Cardiovascular;  Laterality: N/A;     reports that she has been smoking cigarettes. She has been smoking about 1.00 pack per day. She has never used smokeless tobacco. She reports that she does not drink alcohol and does not use drugs. Social History  Allergies  Allergen Reactions  . Hyzaar [Losartan Potassium-Hctz] Other (See Comments)    Caused patient to feel light-headed and gave her vertigo  . Meloxicam Rash  A "very bad" rash    Family History  Problem Relation Age of Onset  . Arthritis Father   . Hypertension Father   . Gout Father   . Hypertension Mother      Prior to Admission medications   Medication Sig Start Date End Date Taking? Authorizing Provider  acetaminophen (TYLENOL) 500 MG tablet Take 500 mg by mouth every 6 (six) hours  as needed for moderate pain.   Yes [provider]  albuterol (VENTOLIN HFA) 108 (90 Base) MCG/ACT inhaler Inhale 1-2 puffs into the lungs every 6 (six) hours as needed for wheezing or shortness of breath. 09/14/19  Yes Newlin, Odette Horns, MD  atorvastatin (LIPITOR) 20 MG tablet TAKE 1 TABLET BY MOUTH EVERY DAY. Must have office visit for refills 11/07/20  Yes Newlin, Odette Horns, MD  carbamide peroxide (DEBROX) 6.5 % OTIC solution Place 5 drops into both ears 2 (two) times daily.   Yes [provider]  clonazePAM (KLONOPIN) 0.5 MG tablet Take 0.5 mg by mouth 2 (two) times daily as needed for anxiety. 11/06/20  Yes [provider]  ELIQUIS 5 MG TABS tablet TAKE 1 TABLET (5 MG TOTAL) BY MOUTH 2 (TWO) TIMES DAILY. 04/07/20  Yes Hoy Register, MD  levothyroxine (SYNTHROID) 100 MCG tablet Take 100 mcg by mouth daily. 09/30/20  Yes [provider]  metoprolol tartrate (LOPRESSOR) 50 MG tablet Take 1 tablet (50 mg total) by mouth 2 (two) times daily. 04/20/19  Yes Hoy Register, MD  mirtazapine (REMERON) 30 MG tablet Take 30 mg by mouth at bedtime. 10/31/20  Yes [provider]  sertraline (ZOLOFT) 100 MG tablet TAKE 1 TABLET BY MOUTH EVERY DAY FOR ANXIETY AND DEPRESSION 10/31/20  Yes Hoy Register, MD  acetaminophen (TYLENOL) 500 MG tablet Take 2 tablets (1,000 mg total) by mouth every 8 (eight) hours as needed for mild pain, moderate pain or headache. Patient not taking: No sig reported 02/07/18   Linus Mako B, NP  amiodarone (PACERONE) 200 MG tablet Take 1 tablet (200 mg total) daily by mouth. Patient not taking: Reported on 12/28/2020 07/02/17   Anders Simmonds, PA-C  benzonatate (TESSALON) 100 MG capsule Take 1 capsule (100 mg total) by mouth every 8 (eight) hours. Patient not taking: No sig reported 03/17/20   Linus Mako B, NP  buPROPion (WELLBUTRIN SR) 150 MG 12 hr tablet Take 1 tablet (150 mg total) by mouth 2 (two) times daily. For smoking cessation Patient  not taking: No sig reported 04/20/19   Hoy Register, MD  busPIRone (BUSPAR) 7.5 MG tablet Take 1 tablet (7.5 mg total) by mouth 2 (two) times daily. Patient not taking: No sig reported 02/04/20   Hoy Register, MD  colchicine 0.6 MG tablet Take 2 tablets at the start of a gout attack. Then 1 more tablet an hour later. Do not repeat this dosing for 2 days. Patient not taking: No sig reported 04/20/19   Hoy Register, MD  diclofenac sodium (VOLTAREN) 1 % GEL Apply 4 g topically 4 (four) times daily. Patient not taking: No sig reported 04/20/19   Hoy Register, MD  doxycycline (VIBRAMYCIN) 100 MG capsule Take 1 capsule (100 mg total) by mouth 2 (two) times daily. Patient not taking: No sig reported 03/17/20   Linus Mako B, NP  fluticasone (FLONASE) 50 MCG/ACT nasal spray Place 2 sprays into both nostrils daily. Patient not taking: No sig reported 11/14/17   Hoy Register, MD  hydrocortisone 2.5 % cream Apply topically 2 (two) times daily.  Patient not taking: No sig reported 03/11/17   Hoy Register, MD  levothyroxine (SYNTHROID) 125 MCG tablet Take 1 tablet (125 mcg total) by mouth daily before breakfast. Patient not taking: No sig reported 01/14/20   Rema Fendt, NP  nicotine (NICODERM CQ - DOSED IN MG/24 HOURS) 21 mg/24hr patch PLACE 1 PATCH (21 MG TOTAL) ONTO THE SKIN DAILY FOR 1 MONTH Patient not taking: Reported on 12/28/2020 12/06/19   Hoy Register, MD  oxyCODONE-acetaminophen (PERCOCET/ROXICET) 5-325 MG tablet Take 1 tablet by mouth every 4 (four) hours as needed for severe pain. Patient not taking: No sig reported 03/02/19   Tanda Rockers, PA-C  traMADol (ULTRAM) 50 MG tablet 1 PO BID PRN Patient not taking: No sig reported 03/10/19   Eldred Manges, MD    Physical Exam: Vitals:   12/28/20 1816 12/28/20 1825 12/28/20 1845 12/28/20 1948  BP:   (!) 130/99 (!) 140/110  Pulse: (!) 114 (!) 28 (!) 115 (!) 126  Resp: (!) 24 16 16  (!) 23  Temp:      TempSrc:      SpO2: (!) 88%  95% 99% 96%  Weight:      Height:        Constitutional: NAD, calm, comfortable, pleasant elderly female laying at 30 degree incline in bed Vitals:   12/28/20 1816 12/28/20 1825 12/28/20 1845 12/28/20 1948  BP:   (!) 130/99 (!) 140/110  Pulse: (!) 114 (!) 28 (!) 115 (!) 126  Resp: (!) 24 16 16  (!) 23  Temp:      TempSrc:      SpO2: (!) 88% 95% 99% 96%  Weight:      Height:       Eyes: PERRL, lids and conjunctivae normal ENMT: Mucous membranes are moist.  Neck: normal, supple,  Respiratory: Diffuse expiratory wheezing throughout on 2 L via nasal cannula normal respiratory effort. No accessory muscle use.  Cardiovascular: Irregular rate and rhythm, no murmurs / rubs / gallops. No extremity edema.  Abdomen: no tenderness, no masses palpated.  Bowel sounds positive.  Musculoskeletal: no clubbing / cyanosis. No joint deformity upper and lower extremities. Good ROM, no contractures. Normal muscle tone.  Skin: no rashes, lesions, ulcers. No induration Neurologic: CN 2-12 grossly intact. Sensation intact,  Strength 5/5 in all 4.  Psychiatric: Normal judgment and insight. Alert and oriented x 3. Normal mood.     Labs on Admission: I have personally reviewed following labs and imaging studies  CBC: Recent Labs  Lab 12/28/20 1727  WBC 4.4  NEUTROABS 2.2  HGB 12.4  HCT 39.1  MCV 94.4  PLT 210   Basic Metabolic Panel: Recent Labs  Lab 12/28/20 1727 12/28/20 1749  NA 138  --   K 4.3  --   CL 102  --   CO2 23  --   GLUCOSE 110*  --   BUN 11  --   CREATININE 0.91  --   CALCIUM 9.0  --   MG  --  1.8   GFR: Estimated Creatinine Clearance: 64.4 mL/min (by C-G formula based on SCr of 0.91 mg/dL). Liver Function Tests: No results for input(s): AST, ALT, ALKPHOS, BILITOT, PROT, ALBUMIN in the last 168 hours. No results for input(s): LIPASE, AMYLASE in the last 168 hours. No results for input(s): AMMONIA in the last 168 hours. Coagulation Profile: No results for input(s):  INR, PROTIME in the last 168 hours. Cardiac Enzymes: No results for input(s): CKTOTAL, CKMB, CKMBINDEX, TROPONINI in the  last 168 hours. BNP (last 3 results) No results for input(s): PROBNP in the last 8760 hours. HbA1C: No results for input(s): HGBA1C in the last 72 hours. CBG: No results for input(s): GLUCAP in the last 168 hours. Lipid Profile: No results for input(s): CHOL, HDL, LDLCALC, TRIG, CHOLHDL, LDLDIRECT in the last 72 hours. Thyroid Function Tests: No results for input(s): TSH, T4TOTAL, FREET4, T3FREE, THYROIDAB in the last 72 hours. Anemia Panel: No results for input(s): VITAMINB12, FOLATE, FERRITIN, TIBC, IRON, RETICCTPCT in the last 72 hours. Urine analysis:    Component Value Date/Time   COLORURINE YELLOW 06/12/2012 1705   APPEARANCEUR CLEAR 06/12/2012 1705   LABSPEC 1.024 06/12/2012 1705   PHURINE 5.0 06/12/2012 1705   GLUCOSEU NEGATIVE 06/12/2012 1705   HGBUR NEGATIVE 06/12/2012 1705   BILIRUBINUR NEGATIVE 06/12/2012 1705   KETONESUR NEGATIVE 06/12/2012 1705   PROTEINUR 30 (A) 06/12/2012 1705   UROBILINOGEN 0.2 06/12/2012 1705   NITRITE NEGATIVE 06/12/2012 1705   LEUKOCYTESUR NEGATIVE 06/12/2012 1705    Radiological Exams on Admission: DG Chest Portable 1 View  Result Date: 12/28/2020 CLINICAL DATA:  Cough EXAM: PORTABLE CHEST 1 VIEW COMPARISON:  03/17/2020 FINDINGS: The heart size and mediastinal contours are within normal limits. Large hiatal hernia. Both lungs are clear. The visualized skeletal structures are unremarkable. IMPRESSION: No active disease.  Large hiatal hernia Electronically Signed   By: Jasmine Pang M.D.   On: 12/28/2020 18:06      Assessment/Plan  Acute Hypoxic respiratory failure secondary to COPD exacerbation - scheduled q4hr ipratropium and Xopenex -Continue oral prednisone 40 mg -Maintain O2 between 88 to 90% -continue tobacco cessation  Atrial flutter with 2-1 conduction block - possibly precipitated by COPD  exacerbation -Has history of chronic atrial fibrillation/atrial flutter s/p TEE guided cardioversion in 2018 - previously on amiodarone but d/c in December 2021 since she says it affected her mental status  - per chart review- last Echo in 07/2020 with normal LV function with EF 60 to 65%.  Nuclear stress test from the same month showed no evidence of ischemia. - Follows with Dr. Houston Siren at [facility] cardiology- he mentioned starting IV diltiazem infusion and potential switching to PO diltiazem  - continue IV diltiazem infusion - hold home metoprolol while on infusion - Continue Eliquis - check TSH in the morning  - correct electrolyte abnormalities as below - could benefit from cardiology consult in the morning  Hypomagnesium Mg of 1.8 -replete with oral Mg   Prolonged QT - avoid QT prolongation meds   Hyperlipidemia Continue statin  Hypothyroidism Continue levothyroxine - Check TSH as above  Anxiety/depression Hold Rameron and Zoloft due to prolonged QT-appears patient does not take SSRI on a regular basis anyways  Hx of recurrent falls Pt mentions about 6 falls back in the fall/winter of 2021 states some are mechanical from tripping on things but others due to LE weakness. Pt lives alone and could use PT assessment. - PT eval ordered   DVT prophylaxis:.Eliquis Code Status: Full Family Communication: Plan discussed with patient at bedside  disposition Plan: Home with at least 2 midnight stays  Consults called:  Admission status: inpatient  Level of care: Progressive  Status is: Inpatient  Remains inpatient appropriate because:Inpatient level of care appropriate due to severity of illness   Dispo: The patient is from: Home              Anticipated d/c is to: Home  Patient currently is not medically stable to d/c.   Difficult to place patient No         Anselm Jungling DO [facility]   If 7PM-7AM, please contact  night-coverage [email or web address]   12/28/2020, 7:52 PM

## 2020-12-28 NOTE — ED Notes (Signed)
88% RA while ambulating in room. 2lpm [place] applied. MD aware

## 2020-12-28 NOTE — ED Notes (Signed)
Cardizem drip titrated to 10mg /hr per [facility]

## 2020-12-28 NOTE — ED Provider Notes (Addendum)
[facility]-EMERGENCY DEPT Provider Note   CSN: [id number] Arrival date & time: 12/28/20  1704     History Chief Complaint  Patient presents with  . Tachycardia  . Wheezing    Anita Russell is a 65 y.o. female.  The history is provided by the patient.  Wheezing Severity:  Mild Severity compared to prior episodes:  Similar Onset quality:  Gradual Timing:  Constant Progression:  Unchanged Chronicity:  New Context: pollens (allergy type symptoms but using inhaler with some relief. Tachycardia at cardiology today, atrial flutter. )   Relieved by:  Nothing Worsened by:  Allergens Associated symptoms: cough   Associated symptoms: no chest pain, no chest tightness, no ear pain, no fatigue, no fever, no foot swelling, no headaches, no orthopnea, no PND, no rash, no rhinorrhea, no shortness of breath, no sore throat, no sputum production, no stridor and no swollen glands        Past Medical History:  Diagnosis Date  . Anxiety   . Asthma   . Depression   . Gout   . Hypertension   . Hypothyroidism   . Mental disorder   . Thyroid disease     Patient Active Problem List   Diagnosis Date Noted  . Elbow dislocation 03/05/2019  . Atrial flutter (HCC) 07/09/2018  . SVT (supraventricular tachycardia) (HCC) 06/11/2017  . Cervical high risk HPV (human papillomavirus) test positive 04/01/2017  . Asthma 02/04/2017  . Hyperlipidemia 07/05/2016  . Loss of weight 07/13/2015  . Chronic RUQ pain 07/13/2015  . Diarrhea 07/13/2015  . Smoking 04/08/2014  . IFG (impaired fasting glucose) 04/08/2014  . Arthritis 10/29/2013  . Anxiety and depression 10/29/2013  . Periodic health assessment, general screening, adult 10/29/2013  . Tinnitus 10/29/2013  . Anxiety 06/17/2012  . Alcohol abuse 06/14/2012  . Gout 06/14/2012  . Hypertension 06/14/2012  . Hypothyroidism 06/14/2012    Past Surgical History:  Procedure Laterality Date  . ABDOMINAL HYSTERECTOMY    .  APPENDECTOMY    . CARDIOVERSION N/A 06/16/2017   Procedure: CARDIOVERSION;  Surgeon: Orpah Cobb, MD;  Location: [facility] ENDOSCOPY;  Service: Cardiovascular;  Laterality: N/A;  . TEE WITHOUT CARDIOVERSION N/A 06/16/2017   Procedure: TRANSESOPHAGEAL ECHOCARDIOGRAM (TEE);  Surgeon: Orpah Cobb, MD;  Location: [facility] ENDOSCOPY;  Service: Cardiovascular;  Laterality: N/A;     OB History    Gravida  2   Para      Term      Preterm      AB  1   Living  1     SAB      IAB      Ectopic      Multiple      Live Births  1           Family History  Problem Relation Age of Onset  . Arthritis Father   . Hypertension Father   . Gout Father   . Hypertension Mother     Social History   Tobacco Use  . Smoking status: Current Every Day Smoker    Packs/day: 1.00    Types: Cigarettes  . Smokeless tobacco: Never Used  Vaping Use  . Vaping Use: Never used  Substance Use Topics  . Alcohol use: No    Alcohol/week: 4.0 - 7.0 standard drinks    Types: 1 - 3 Glasses of wine, 3 - 4 Cans of beer per week    Comment: 3 times weekly  . Drug use: No    Home  Medications Prior to Admission medications   Medication Sig Start Date End Date Taking? Authorizing Provider  acetaminophen (TYLENOL) 500 MG tablet Take 2 tablets (1,000 mg total) by mouth every 8 (eight) hours as needed for mild pain, moderate pain or headache. Patient not taking: Reported on 03/02/2019 02/07/18   Georgetta Haber, NP  albuterol (VENTOLIN HFA) 108 (90 Base) MCG/ACT inhaler Inhale 1-2 puffs into the lungs every 6 (six) hours as needed for wheezing or shortness of breath. 09/14/19   Hoy Register, MD  amiodarone (PACERONE) 200 MG tablet Take 1 tablet (200 mg total) daily by mouth. 07/02/17   Anders Simmonds, PA-C  atorvastatin (LIPITOR) 20 MG tablet TAKE 1 TABLET BY MOUTH EVERY DAY. Must have office visit for refills 11/07/20   Hoy Register, MD  benzonatate (TESSALON) 100 MG capsule Take 1 capsule (100 mg total) by  mouth every 8 (eight) hours. 03/17/20   Georgetta Haber, NP  buPROPion (WELLBUTRIN SR) 150 MG 12 hr tablet Take 1 tablet (150 mg total) by mouth 2 (two) times daily. For smoking cessation 04/20/19   Hoy Register, MD  busPIRone (BUSPAR) 7.5 MG tablet Take 1 tablet (7.5 mg total) by mouth 2 (two) times daily. 02/04/20   Hoy Register, MD  colchicine 0.6 MG tablet Take 2 tablets at the start of a gout attack. Then 1 more tablet an hour later. Do not repeat this dosing for 2 days. 04/20/19   Hoy Register, MD  diclofenac sodium (VOLTAREN) 1 % GEL Apply 4 g topically 4 (four) times daily. Patient not taking: Reported on 01/14/2020 04/20/19   Hoy Register, MD  doxycycline (VIBRAMYCIN) 100 MG capsule Take 1 capsule (100 mg total) by mouth 2 (two) times daily. 03/17/20   Burky, Barron Alvine, NP  ELIQUIS 5 MG TABS tablet TAKE 1 TABLET (5 MG TOTAL) BY MOUTH 2 (TWO) TIMES DAILY. 04/07/20   Hoy Register, MD  fluticasone (FLONASE) 50 MCG/ACT nasal spray Place 2 sprays into both nostrils daily. Patient not taking: Reported on 03/02/2019 11/14/17   Hoy Register, MD  hydrocortisone 2.5 % cream Apply topically 2 (two) times daily. Patient not taking: Reported on 04/20/2019 03/11/17   Hoy Register, MD  levothyroxine (SYNTHROID) 125 MCG tablet Take 1 tablet (125 mcg total) by mouth daily before breakfast. 01/14/20   Rema Fendt, NP  metoprolol tartrate (LOPRESSOR) 50 MG tablet Take 1 tablet (50 mg total) by mouth 2 (two) times daily. 04/20/19   Hoy Register, MD  nicotine (NICODERM CQ - DOSED IN MG/24 HOURS) 21 mg/24hr patch PLACE 1 PATCH (21 MG TOTAL) ONTO THE SKIN DAILY FOR 1 MONTH 12/06/19   Hoy Register, MD  oxyCODONE-acetaminophen (PERCOCET/ROXICET) 5-325 MG tablet Take 1 tablet by mouth every 4 (four) hours as needed for severe pain. Patient not taking: Reported on 04/20/2019 03/02/19   Tanda Rockers, PA-C  sertraline (ZOLOFT) 100 MG tablet TAKE 1 TABLET BY MOUTH EVERY DAY FOR ANXIETY AND DEPRESSION  10/31/20   Hoy Register, MD  traMADol Janean Sark) 50 MG tablet 1 PO BID PRN Patient not taking: Reported on 04/20/2019 03/10/19   Eldred Manges, MD    Allergies    Hyzaar [losartan potassium-hctz] and Meloxicam  Review of Systems   Review of Systems  Constitutional: Negative for chills, fatigue and fever.  HENT: Negative for ear pain, rhinorrhea and sore throat.   Eyes: Negative for pain and visual disturbance.  Respiratory: Positive for cough and wheezing. Negative for sputum production, chest tightness, shortness of breath  and stridor.   Cardiovascular: Negative for chest pain, palpitations, orthopnea and PND.  Gastrointestinal: Negative for abdominal pain and vomiting.  Genitourinary: Negative for dysuria and hematuria.  Musculoskeletal: Negative for arthralgias and back pain.  Skin: Negative for color change and rash.  Neurological: Negative for seizures, syncope and headaches.  All other systems reviewed and are negative.   Physical Exam Updated Vital Signs  ED Triage Vitals  Enc Vitals Group     BP 12/28/20 1712 (!) 154/109     Pulse Rate 12/28/20 1712 (!) 129     Resp 12/28/20 1712 (!) 22     Temp 12/28/20 1712 98.1 F (36.7 C)     Temp Source 12/28/20 1712 Oral     SpO2 12/28/20 1712 95 %     Weight 12/28/20 1721 166 lb (75.3 kg)     Height 12/28/20 1721 5\' 9"  (1.753 m)     Head Circumference --      Peak Flow --      Pain Score 12/28/20 1721 0     Pain Loc --      Pain Edu? --      Excl. in GC? --     Physical Exam Vitals and nursing note reviewed.  Constitutional:      General: She is not in acute distress.    Appearance: She is well-developed. She is not ill-appearing.  HENT:     Head: Normocephalic and atraumatic.     Nose: Nose normal.     Mouth/Throat:     Mouth: Mucous membranes are moist.  Eyes:     Extraocular Movements: Extraocular movements intact.     Conjunctiva/sclera: Conjunctivae normal.     Pupils: Pupils are equal, round, and reactive to  light.  Cardiovascular:     Rate and Rhythm: Regular rhythm. Tachycardia present.     Pulses: Normal pulses.     Heart sounds: Normal heart sounds. No murmur heard.   Pulmonary:     Effort: Pulmonary effort is normal. No respiratory distress.     Breath sounds: Wheezing present.  Abdominal:     Palpations: Abdomen is soft.     Tenderness: There is no abdominal tenderness.  Musculoskeletal:     Cervical back: Normal range of motion and neck supple.  Skin:    General: Skin is warm and dry.     Capillary Refill: Capillary refill takes less than 2 seconds.  Neurological:     General: No focal deficit present.     Mental Status: She is alert and oriented to person, place, and time.     Cranial Nerves: No cranial nerve deficit.     Sensory: No sensory deficit.     Motor: No weakness.     ED Results / Procedures / Treatments   Labs (all labs ordered are listed, but only abnormal results are displayed) Labs Reviewed  CBC WITH DIFFERENTIAL/PLATELET - Abnormal; Notable for the following components:      Result Value   RDW 16.6 (*)    All other components within normal limits  BASIC METABOLIC PANEL - Abnormal; Notable for the following components:   Glucose, Bld 110 (*)    All other components within normal limits  RESP PANEL BY RT-PCR (FLU A&B, COVID) [id number]  MAGNESIUM    EKG EKG Interpretation  Date/Time:  Thursday Dec 28 2020 17:44:18 EDT Ventricular Rate:  134 PR Interval:    QRS Duration: 101 QT Interval:  339 QTC Calculation: 507 R Axis:  90 Text Interpretation: Atrial flutter with predominant 2:1 AV block Borderline right axis deviation Minimal ST depression, lateral leads Prolonged QT interval 12 Lead; Mason-Likar Confirmed by Virgina Norfolkuratolo, Keano Guggenheim ([id number]) on 12/28/2020 5:47:54 PM   Radiology DG Chest Portable 1 View  Result Date: 12/28/2020 CLINICAL DATA:  Cough EXAM: PORTABLE CHEST 1 VIEW COMPARISON:  03/17/2020 FINDINGS: The heart size and mediastinal contours are  within normal limits. Large hiatal hernia. Both lungs are clear. The visualized skeletal structures are unremarkable. IMPRESSION: No active disease.  Large hiatal hernia Electronically Signed   By: Jasmine PangKim  Fujinaga M.D.   On: 12/28/2020 18:06    Procedures .Critical Care Performed by: Virgina Norfolkuratolo, Rukaya Kleinschmidt, DO Authorized by: Virgina Norfolkuratolo, Shontae Rosiles, DO   Critical care provider statement:    Critical care time (minutes):  35   Critical care was necessary to treat or prevent imminent or life-threatening deterioration of the following conditions:  Respiratory failure and circulatory failure   Critical care was time spent personally by me on the following activities:  Blood draw for specimens, development of treatment plan with patient or surrogate, discussions with primary provider, evaluation of patient's response to treatment, examination of patient, obtaining history from patient or surrogate, ordering and performing treatments and interventions, ordering and review of laboratory studies, ordering and review of radiographic studies, pulse oximetry, re-evaluation of patient's condition and review of old charts   I assumed direction of critical care for this patient from another provider in my specialty: no       Medications Ordered in ED Medications  diltiazem (CARDIZEM) 125 mg in dextrose 5% 125 mL (1 mg/mL) infusion (5 mg/hr Intravenous New Bag/Given 12/28/20 1847)  predniSONE (DELTASONE) tablet 60 mg (60 mg Oral Given 12/28/20 1758)  albuterol (VENTOLIN HFA) 108 (90 Base) MCG/ACT inhaler 2 puff (2 puffs Inhalation Given 12/28/20 1758)  diltiazem (CARDIZEM) injection 10 mg (10 mg Intravenous Given 12/28/20 1752)  ipratropium (ATROVENT HFA) inhaler 2 puff (2 puffs Inhalation Given 12/28/20 1843)    ED Course  I have reviewed the triage vital signs and the nursing notes.  Pertinent labs & imaging results that were available during my care of the patient were reviewed by me and considered in my medical decision making  (see chart for details).    MDM Rules/Calculators/A&P                          Kristin BruinsMaryanne Russell is here with tachycardia and wheezing/shortness of breath.  Patient with smoking history.  Uses inhaler.  Patient is tachycardic to the 130s.  EKG shows atrial flutter with RVR.  She was instructed by her cardiologist to come for evaluation.  She has had wheezing and shortness of breath for the last several days.  Has been using her inhaler more with not much improvement.  Patient overall appears to have wheezing on exam.  When she ambulates in the room she gets hypoxic.  Overall suspect COPD exacerbation.  Will give prednisone.  Will get chest x-ray to check for infection.  We will start patient on diltiazem infusion after bolus.  Chest x-ray shows no pneumonia.  No significant anemia, electrolyte abnormality.  COVID test is negative.  Overall COPD exacerbation with hypoxia improved on 2 L of oxygen.  A. fib atrial flutter with RVR improving on diltiazem infusion.  Will admit to medicine for further care.  This chart was dictated using voice recognition software.  Despite best efforts to proofread,  errors can occur which can  change the documentation meaning.    Final Clinical Impression(s) / ED Diagnoses Final diagnoses:  Acute respiratory failure with hypoxia (HCC)  COPD exacerbation (HCC)  Atrial flutter with rapid ventricular response [facility])    Rx / DC Orders ED Discharge Orders    None       Virgina Norfolk, DO 12/28/20 1929    Virgina Norfolk, DO 12/28/20 1932

## 2020-12-28 NOTE — ED Triage Notes (Signed)
Per patient went to cardiology check up for her A-fib. Patient states cardiologist sent her to ER for wheezing and tachycardia.

## 2020-12-29 DIAGNOSIS — W19XXXD Unspecified fall, subsequent encounter: Secondary | ICD-10-CM

## 2020-12-29 DIAGNOSIS — F419 Anxiety disorder, unspecified: Secondary | ICD-10-CM

## 2020-12-29 DIAGNOSIS — I4892 Unspecified atrial flutter: Principal | ICD-10-CM

## 2020-12-29 DIAGNOSIS — J9601 Acute respiratory failure with hypoxia: Secondary | ICD-10-CM

## 2020-12-29 DIAGNOSIS — E039 Hypothyroidism, unspecified: Secondary | ICD-10-CM

## 2020-12-29 DIAGNOSIS — E7800 Pure hypercholesterolemia, unspecified: Secondary | ICD-10-CM

## 2020-12-29 DIAGNOSIS — R9431 Abnormal electrocardiogram [ECG] [EKG]: Secondary | ICD-10-CM

## 2020-12-29 LAB — BASIC METABOLIC PANEL
Anion gap: 13 (ref 5–15)
BUN: 10 mg/dL (ref 8–23)
CO2: 21 mmol/L — ABNORMAL LOW (ref 22–32)
Calcium: 8.8 mg/dL — ABNORMAL LOW (ref 8.9–10.3)
Chloride: 101 mmol/L (ref 98–111)
Creatinine, Ser: 0.97 mg/dL (ref 0.44–1.00)
GFR, Estimated: >60 mL/min (ref >60)
Glucose, Bld: 193 mg/dL — ABNORMAL HIGH (ref 70–99)
Potassium: 4.2 mmol/L (ref 3.5–5.1)
Sodium: 135 mmol/L (ref 135–145)

## 2020-12-29 LAB — CBC
HCT: 37.8 % (ref 36.0–46.0)
Hemoglobin: 12 g/dL (ref 12.0–15.0)
MCH: 30.5 pg (ref 26.0–34.0)
MCHC: 31.7 g/dL (ref 30.0–36.0)
MCV: 95.9 fL (ref 80.0–100.0)
Platelets: 170 10*3/uL (ref 150–400)
RBC: 3.94 MIL/uL (ref 3.87–5.11)
RDW: 16.3 % — ABNORMAL HIGH (ref 11.5–15.5)
WBC: 2.4 10*3/uL — ABNORMAL LOW (ref 4.0–10.5)
nRBC: 0 % (ref 0.0–0.2)

## 2020-12-29 LAB — TSH: TSH: 0.566 u[IU]/mL (ref 0.350–4.500)

## 2020-12-29 MED ORDER — METOPROLOL TARTRATE 50 MG PO TABS
50.0000 mg | ORAL_TABLET | Freq: Two times a day (BID) | ORAL | Status: DC
  Administered 2020-12-29 – 2020-12-30 (×3): 50 mg via ORAL
  Filled 2020-12-29 (×3): qty 1

## 2020-12-29 NOTE — Progress Notes (Signed)
PROGRESS NOTE    Anita Russell  WEX:[id number] DOB: 1956-02-24 DOA: 12/28/2020 PCP: Hoy Register, MD   Brief Narrative:  Anita Russell is a 65 y.o. female with medical history significant for atrial flutter/chronic atrial fibrillation on Eliquis, metoprolol and previously amiodarone, asthma/COPD, hypertension, hypothyroid, anxiety, depression hyperlipidemia who presents at the behest of her cardiologist for rapid A. Fib/flutter.  Patient has seasonal allergies with most notable symptoms in the spring with worsening shortness of breath and dyspnea with exertion over the past few days to week with nonproductive cough questionable chest tightness most notably with exertion.  Patient presented to cardiologist office for further evaluation work-up and was found to be in rapid A. fib heart rate greater than 140 with a 2-1 atrial flutter and AV block.  Patient ultimately presented to the ED this morning with heart rate above 120 even at rest and noted to be transiently hypoxic with exertion in the range of 88 to 89% and was subsequently placed on 2 L nasal cannula.  Assessment & Plan:   Principal Problem:   Atrial flutter (HCC) Active Problems:   Hypothyroidism   Anxiety   Hyperlipidemia   Acute respiratory failure with hypoxia (HCC)   Hypomagnesemia   Prolonged QT interval   Fall   Acute Hypoxic respiratory failure secondary to COPD/asthma exacerbation - scheduled q4hr ipratropium and Xopenex - Continue oral prednisone 40 mg - Wean oxygen aggressively, baseline on room air around-the-clock - Patient would benefit from outpatient pulmonology evaluation given she is not clear of her actual pulmonary diagnosis, she reports asthma as a lifelong diagnosis but given her known prolific tobacco use and abuse she likely has some overlap and would benefit from PFTs. - continue tobacco cessation  Atrial flutter with 2-1 conduction block -Provoked in the setting of above with acute  hypoxia -Continue dill drip, resume home metoprolol -Previously on amiodarone with mental status changes reported as a side effect and was ultimately stopped 2021. -Previously on amiodarone but d/c in December 2021 since she says it affected her mental status  -Per chart review- last Echo in 07/2020 with normal LV function with EF 60 to 65%.  Nuclear stress test from the same month showed no evidence of ischemia. -Primary cardiology at [facility], outpatient follow-up there as scheduled - Continue Eliquis   Hypomagnesium Mg of 1.8 -replete with oral Mg   Prolonged QT - avoid QT prolongation meds   Hyperlipidemia Continue statin  Hypothyroidism Continue levothyroxine TSH within normal limits  Anxiety/depression Hold Rameron and Zoloft due to prolonged QT-appears patient does not take SSRI on a regular basis  -we discussed these are not as needed medications and needs to be followed by PCP for further discussion about anxiolytics and antidepressants.  DVT prophylaxis: Eliquis Code Status: Full Family Communication: None present  Status is: Inpatient  Dispo: The patient is from: Home              Anticipated d/c is to: Home              Anticipated d/c date is: 24 to 48 hours              Patient currently not medically stable for discharge  Consultants:   None  Procedures:   None  Antimicrobials:  None indicated  Subjective: No acute issues or events overnight denies chest pain shortness of breath nausea vomiting diarrhea constipation any fevers or chills.  Objective: Vitals:   12/29/20 0451 12/29/20 9381 12/29/20 0640 12/29/20 0643  BP:  108/77 93/75 (!) 83/64   Pulse: 97 76  85  Resp: 19 20  16   Temp:  97.8 F (36.6 C)    TempSrc:  Oral    SpO2:  94%    Weight:      Height:        Intake/Output Summary (Last 24 hours) at 12/29/2020 0743 Last data filed at 12/29/2020 0600 Gross per 24 hour  Intake 135.72 ml  Output --  Net 135.72 ml   Filed Weights    12/28/20 1721 12/28/20 2230  Weight: 75.3 kg 75.2 kg    Examination:  General:  Pleasantly resting in bed, No acute distress. HEENT:  Normocephalic atraumatic.  Sclerae nonicteric, noninjected.  Extraocular movements intact bilaterally. Neck:  Without mass or deformity.  Trachea is midline. Lungs:  Clear to auscultate bilaterally without rhonchi, wheeze, or rales. Heart: Irregularly irregular rate and rhythm.  Without murmurs, rubs, or gallops. Abdomen:  Soft, nontender, nondistended.  Without guarding or rebound. Extremities: Without cyanosis, clubbing, edema, or obvious deformity. Vascular:  Dorsalis pedis and posterior tibial pulses palpable bilaterally. Skin:  Warm and dry, no erythema, no ulcerations.  Data Reviewed: I have personally reviewed following labs and imaging studies  CBC: Recent Labs  Lab 12/28/20 1727 12/29/20 0308  WBC 4.4 2.4*  NEUTROABS 2.2  --   HGB 12.4 12.0  HCT 39.1 37.8  MCV 94.4 95.9  PLT 210 170   Basic Metabolic Panel: Recent Labs  Lab 12/28/20 1727 12/28/20 1749 12/29/20 0308  NA 138  --  135  K 4.3  --  4.2  CL 102  --  101  CO2 23  --  21*  GLUCOSE 110*  --  193*  BUN 11  --  10  CREATININE 0.91  --  0.97  CALCIUM 9.0  --  8.8*  MG  --  1.8  --    GFR: Estimated Creatinine Clearance: 60.4 mL/min (by C-G formula based on SCr of 0.97 mg/dL). Liver Function Tests: No results for input(s): AST, ALT, ALKPHOS, BILITOT, PROT, ALBUMIN in the last 168 hours. No results for input(s): LIPASE, AMYLASE in the last 168 hours. No results for input(s): AMMONIA in the last 168 hours. Coagulation Profile: No results for input(s): INR, PROTIME in the last 168 hours. Cardiac Enzymes: No results for input(s): CKTOTAL, CKMB, CKMBINDEX, TROPONINI in the last 168 hours. BNP (last 3 results) No results for input(s): PROBNP in the last 8760 hours. HbA1C: No results for input(s): HGBA1C in the last 72 hours. CBG: No results for input(s): GLUCAP in  the last 168 hours. Lipid Profile: No results for input(s): CHOL, HDL, LDLCALC, TRIG, CHOLHDL, LDLDIRECT in the last 72 hours. Thyroid Function Tests: Recent Labs    12/29/20 0308  TSH 0.566   Anemia Panel: No results for input(s): VITAMINB12, FOLATE, FERRITIN, TIBC, IRON, RETICCTPCT in the last 72 hours. Sepsis Labs: No results for input(s): PROCALCITON, LATICACIDVEN in the last 168 hours.  Recent Results (from the past 240 hour(s))  Resp Panel by RT-PCR (Flu A&B, Covid) Nasopharyngeal Swab     Status: None   Collection Time: 12/28/20  5:38 PM   Specimen: Nasopharyngeal Swab; Nasopharyngeal(NP) swabs in vial transport medium  Result Value Ref Range Status   SARS Coronavirus 2 by RT PCR NEGATIVE NEGATIVE Final    Comment: (NOTE) SARS-CoV-2 target nucleic acids are NOT DETECTED.  The SARS-CoV-2 RNA is generally detectable in upper respiratory specimens during the acute phase of infection. The lowest concentration of  SARS-CoV-2 viral copies this assay can detect is 138 copies/mL. A negative result does not preclude SARS-Cov-2 infection and should not be used as the sole basis for treatment or other patient management decisions. A negative result may occur with  improper specimen collection/handling, submission of specimen other than nasopharyngeal swab, presence of viral mutation(s) within the areas targeted by this assay, and inadequate number of viral copies(<138 copies/mL). A negative result must be combined with clinical observations, patient history, and epidemiological information. The expected result is Negative.  Fact Sheet for Patients:  BloggerCourse.com  Fact Sheet for Healthcare Providers:  SeriousBroker.it  This test is no t yet approved or cleared by the [place] FDA and  has been authorized for detection and/or diagnosis of SARS-CoV-2 by FDA under an Emergency Use Authorization (EUA). This EUA will remain   in effect (meaning this test can be used) for the duration of the COVID-19 declaration under Section 564(b)(1) of the Act, 21 U.S.C.section 360bbb-3(b)(1), unless the authorization is terminated  or revoked sooner.       Influenza A by PCR NEGATIVE NEGATIVE Final   Influenza B by PCR NEGATIVE NEGATIVE Final    Comment: (NOTE) The Xpert Xpress SARS-CoV-2/FLU/RSV plus assay is intended as an aid in the diagnosis of influenza from Nasopharyngeal swab specimens and should not be used as a sole basis for treatment. Nasal washings and aspirates are unacceptable for Xpert Xpress SARS-CoV-2/FLU/RSV testing.  Fact Sheet for Patients: BloggerCourse.com  Fact Sheet for Healthcare Providers: SeriousBroker.it  This test is not yet approved or cleared by the [place] FDA and has been authorized for detection and/or diagnosis of SARS-CoV-2 by FDA under an Emergency Use Authorization (EUA). This EUA will remain in effect (meaning this test can be used) for the duration of the COVID-19 declaration under Section 564(b)(1) of the Act, 21 U.S.C. section 360bbb-3(b)(1), unless the authorization is terminated or revoked.  Performed at [facility], [address], [place], [place] [address]          Radiology Studies: DG Chest Portable 1 View  Result Date: 12/28/2020 CLINICAL DATA:  Cough EXAM: PORTABLE CHEST 1 VIEW COMPARISON:  03/17/2020 FINDINGS: The heart size and mediastinal contours are within normal limits. Large hiatal hernia. Both lungs are clear. The visualized skeletal structures are unremarkable. IMPRESSION: No active disease.  Large hiatal hernia Electronically Signed   By: Jasmine Pang M.D.   On: 12/28/2020 18:06    Scheduled Meds: . apixaban  5 mg Oral BID  . atorvastatin  20 mg Oral Daily  . dextromethorphan-guaiFENesin  1 tablet Oral BID  . ipratropium  0.5 mg Nebulization TID  . levalbuterol  1.25  mg Nebulization TID  . levothyroxine  100 mcg Oral Daily  . predniSONE  40 mg Oral Q breakfast   Continuous Infusions: . diltiazem (CARDIZEM) infusion Stopped (12/29/20 9562)     LOS: 1 day   Time spent:  Azucena Fallen, DO [facility]  If 7PM-7AM, please contact night-coverage [email or web address]  12/29/2020, 7:43 AM

## 2020-12-29 NOTE — Progress Notes (Signed)
   12/28/20 2230  Assess: MEWS Score  Temp 98.2 F (36.8 C)  BP (!) 129/109  Pulse Rate (!) 120  Resp 20  Level of Consciousness Alert  Assess: MEWS Score  MEWS Temp 0  MEWS Systolic 0  MEWS Pulse 2  MEWS RR 0  MEWS LOC 0  MEWS Score 2  MEWS Score Color Yellow  Assess: if the MEWS score is Yellow or Red  Were vital signs taken at a resting state? Yes  Focused Assessment No change from prior assessment  Early Detection of Sepsis Score *See Row Information* Low  MEWS guidelines implemented *See Row Information* Yes  Treat  MEWS Interventions Administered scheduled meds/treatments  Pain Scale 0-10  Pain Score 0  Take Vital Signs  Increase Vital Sign Frequency  Yellow: Q 2hr X 2 then Q 4hr X 2, if remains yellow, continue Q 4hrs  Escalate  MEWS: Escalate Yellow: discuss with charge nurse/RN and consider discussing with provider and RRT  Notify: Charge Nurse/RN  Name of Charge Nurse/RN Notified Atlee Abide, RN  Date Charge Nurse/RN Notified 12/28/20  Time Charge Nurse/RN Notified 2230

## 2020-12-29 NOTE — Evaluation (Signed)
Physical Therapy Evaluation Patient Details Name: Anita Russell MRN: [id number] DOB: Jul 02, 1956 Today's Date: 12/29/2020   History of Present Illness  Pt is 65 yo female who has been admitted with atrial flutter with RVR.  She has medical hx of atrial flutter/chronic atrial fibrillation on Eliquis, metoprolol and previously amiodarone, asthma/COPD, hypertension, hypothyroid, anxiety, depression hyperlipidemia  Clinical Impression  Pt admitted with above diagnosis.  Evaluation was limited to short distance ambulation due to elevated BP and HR -of which pt was asymptomatic.  She demonstrated independent transfers and safe gait with no major deficits.  Demonstrated safe balance and able to navigate around objects in room.  She had hx of episodes of falls ~6 months ago but fixed the cause of her falls (see below) and has had no falls since.  Pt does not require skilled PT services.  Recommend ambulation with nursing as HR / BP are controlled.  If pt develops changes please reconsult PT.     Follow Up Recommendations No PT follow up    Equipment Recommendations  None recommended by PT    Recommendations for Other Services       Precautions / Restrictions Precautions Precautions: Fall Precaution Comments: elevated HR and BP      Mobility  Bed Mobility Overal bed mobility: Independent                  Transfers Overall transfer level: Needs assistance Equipment used: None Transfers: Sit to/from Stand Sit to Stand: Supervision            Ambulation/Gait Ambulation/Gait assistance: Supervision Gait Distance (Feet): 25 Feet Assistive device: None Gait Pattern/deviations: WFL(Within Functional Limits) Gait velocity: normal   General Gait Details: normal gait but limited by elevated HR and BP  Stairs            Wheelchair Mobility    Modified Rankin (Stroke Patients Only)       Balance Overall balance assessment: Independent   Sitting balance-Leahy  Scale: Normal       Standing balance-Leahy Scale: Normal                               Pertinent Vitals/Pain Pain Assessment: No/denies pain    Home Living Family/patient expects to be discharged to:: Private residence Living Arrangements: Alone Available Help at Discharge: Family;Available PRN/intermittently;Friend(s) Type of Home: Apartment Home Access: Stairs to enter Entrance Stairs-Rails: Doctor, general practice of Steps: 3 Home Layout: One level Home Equipment: Cane - single point      Prior Function Level of Independence: Needs assistance   Gait / Transfers Assistance Needed: Pt reports varied abilities based on breathing/anxious but can typically ambulate in community without AD; has a cane for when gout flairs  ADL's / Homemaking Assistance Needed: Pt can do IADLs and ADLs  Comments: Pt had episode of falling about 6 months ago - she attributed it to moving to quick and poor shoe choice.  She reports has changed shoes and takes her time - no fall since     Hand Dominance        Extremity/Trunk Assessment   Upper Extremity Assessment Upper Extremity Assessment: Overall WFL for tasks assessed    Lower Extremity Assessment Lower Extremity Assessment: Overall WFL for tasks assessed    Cervical / Trunk Assessment Cervical / Trunk Assessment: Normal  Communication   Communication: No difficulties  Cognition Arousal/Alertness: Awake/alert Behavior During Therapy: WFL for tasks assessed/performed Overall  Cognitive Status: Within Functional Limits for tasks assessed                                        General Comments General comments (skin integrity, edema, etc.): Pt's HR was 107 bpm rest, increased to 115-135 bpm with activity.  BP was 135/100 pre and 130/109 after short walk in room. Pt is completely asymptomatic.  Discussed only limited by elevated HR and BP.  Balance, mobility, strength are good.  No further need  for acute PT - recommend ambulation with nursing when able.    Exercises     Assessment/Plan    PT Assessment Patent does not need any further PT services  PT Problem List         PT Treatment Interventions      PT Goals (Current goals can be found in the Care Plan section)  Acute Rehab PT Goals Patient Stated Goal: return home PT Goal Formulation: All assessment and education complete, DC therapy    Frequency     Barriers to discharge        Co-evaluation               AM-PAC PT "6 Clicks" Mobility  Outcome Measure Help needed turning from your back to your side while in a flat bed without using bedrails?: None Help needed moving from lying on your back to sitting on the side of a flat bed without using bedrails?: None Help needed moving to and from a bed to a chair (including a wheelchair)?: A Little Help needed standing up from a chair using your arms (e.g., wheelchair or bedside chair)?: A Little Help needed to walk in hospital room?: A Little Help needed climbing 3-5 steps with a railing? : A Little 6 Click Score: 20    End of Session   Activity Tolerance: Other (comment) (limited by elevated HR and BP) Patient left: in bed;with call bell/phone within reach;with bed alarm set Nurse Communication: Mobility status      Time: 7106-2694 PT Time Calculation (min) (ACUTE ONLY): 20 min   Charges:   PT Evaluation $PT Eval Moderate Complexity: 1 Mod          Jaise Moser, PT Acute Rehab Services Pager [phone] [facility] [phone]    Rayetta Humphrey 12/29/2020, 3:48 PM

## 2020-12-29 NOTE — Progress Notes (Signed)
   12/29/20 8588  Provider Notification  Provider Name/Title Linton Flemings  Date Provider Notified 12/29/20  Time Provider Notified [phone]  Notification Type Page  Notification Reason Other (Comment) (Pt. Bp 83/64. Cardizem stopped and provider notified per order.)  Provider response No new orders   Information passed on to day shift nurse Micah Noel. S, RN

## 2020-12-30 LAB — BASIC METABOLIC PANEL
Anion gap: 9 (ref 5–15)
BUN: 16 mg/dL (ref 8–23)
CO2: 27 mmol/L (ref 22–32)
Calcium: 9.4 mg/dL (ref 8.9–10.3)
Chloride: 107 mmol/L (ref 98–111)
Creatinine, Ser: 0.82 mg/dL (ref 0.44–1.00)
GFR, Estimated: >60 mL/min (ref >60)
Glucose, Bld: 103 mg/dL — ABNORMAL HIGH (ref 70–99)
Potassium: 4.3 mmol/L (ref 3.5–5.1)
Sodium: 143 mmol/L (ref 135–145)

## 2020-12-30 LAB — CBC
HCT: 35.5 % — ABNORMAL LOW (ref 36.0–46.0)
Hemoglobin: 11.1 g/dL — ABNORMAL LOW (ref 12.0–15.0)
MCH: 30.5 pg (ref 26.0–34.0)
MCHC: 31.3 g/dL (ref 30.0–36.0)
MCV: 97.5 fL (ref 80.0–100.0)
Platelets: 180 10*3/uL (ref 150–400)
RBC: 3.64 MIL/uL — ABNORMAL LOW (ref 3.87–5.11)
RDW: 16.6 % — ABNORMAL HIGH (ref 11.5–15.5)
WBC: 5.6 10*3/uL (ref 4.0–10.5)
nRBC: 0 % (ref 0.0–0.2)

## 2020-12-30 MED ORDER — PREDNISONE 10 MG PO TABS: ORAL_TABLET | ORAL | 0 refills | Status: AC

## 2020-12-30 MED ORDER — LEVALBUTEROL HCL 0.31 MG/3ML IN NEBU: 0.3100 mg | INHALATION_SOLUTION | RESPIRATORY_TRACT | 2 refills | Status: DC | PRN

## 2020-12-30 MED ORDER — ALBUTEROL SULFATE HFA 108 (90 BASE) MCG/ACT IN AERS: 2.0000 | INHALATION_SPRAY | Freq: Four times a day (QID) | RESPIRATORY_TRACT | 2 refills | Status: AC | PRN

## 2020-12-30 NOTE — Progress Notes (Signed)
AVS given to patient and explained at the bedside. Medications and follow up appointments have been explained with pt verbalizing understanding.  

## 2020-12-30 NOTE — Discharge Summary (Signed)
Physician Discharge Summary  Anita Russell:[id number] DOB: [birth year] DOA: 12/28/2020  PCP: Hoy Register, MD  Admit date: 12/28/2020 Discharge date: 12/30/2020  Admitted From: Home Disposition: Home  Recommendations for Outpatient Follow-up:  1. Follow up with PCP in 1-2 weeks 2. Please obtain BMP/CBC in one week 3. Please follow up with cardiology and possibly pulmonology as scheduled as we discussed  Home Health: None Equipment/Devices: Nebulizer  Discharge Condition: Stable CODE STATUS: Full Diet recommendation: As tolerated  Brief/Interim Summary: Anita Weisbrodtis a 65 y.o.femalewith medical history significant foratrial flutter/chronic atrial fibrillation on Eliquis, metoprolol and previously amiodarone, asthma/COPD, hypertension, hypothyroid, anxiety, depression hyperlipidemia who presents at the behest of her cardiologist for rapid A. Fib/flutter.  Patient has seasonal allergies with most notable symptoms in the spring with worsening shortness of breath and dyspnea with exertion over the past few days to week with nonproductive cough questionable chest tightness most notably with exertion.  Patient presented to cardiologist office for further evaluation work-up and was found to be in rapid A. fib heart rate greater than 140 with a 2-1 atrial flutter and AV block.  Patient ultimately presented to the ED this morning with heart rate above 120 even at rest and noted to be transiently hypoxic with exertion in the range of 88 to 89% and was subsequently placed on 2 L nasal cannula.  Patient admitted as above with acute a fib provoked in the setting of COPD or asthma exacerbation with acute hypoxia.  Patient has not had formal diagnosis of COPD but does have decades of smoking history.  Patient's asthma diagnosis was made as a child, unclear at this point if patient's acute hypoxic event was in the setting of combination or asthma versus COPD.  Regardless patient was weaned off  of oxygen with steroids and nebs.  She was initially placed on diltiazem drip with appropriate heart rate response after reinitiating patient's home metoprolol.  Diltiazem was discontinued.  Given patient's rapid recovery and likely provoked A. fib now rate controlled even with exertion.  Patient otherwise stable and agreeable for discharge home, close follow-up with PCP next week as scheduled, follow-up with cardiology and currently recommending pulmonology follow-up as well but patient wishes to discuss with her primary care physician first which is reasonable.  Patient will be discharged home on nebulizer machine and levalbuterol in hopes to prevent any further respiratory symptoms and avoid tachyarrhythmias until she can follow-up with PCP and/or pulmonology for further formal diagnosis of respiratory symptoms.  Discharge Diagnoses:  Principal Problem:   Atrial flutter (HCC) Active Problems:   Hypothyroidism   Anxiety   Hyperlipidemia   Acute respiratory failure with hypoxia (HCC)   Hypomagnesemia   Prolonged QT interval   Fall    Discharge Instructions  Discharge Instructions    Call MD for:  difficulty breathing, headache or visual disturbances   Complete by: As directed    Call MD for:  extreme fatigue   Complete by: As directed    Diet - low sodium heart healthy   Complete by: As directed    Discharge instructions   Complete by: As directed    Follow-up with PCP as scheduled next week to discuss respiratory status and possible need for pulmonology referral   For home use only DME Nebulizer machine   Complete by: As directed    Patient needs a nebulizer to treat with the following condition: COPD (chronic obstructive pulmonary disease) (HCC)   Length of Need: Lifetime   Increase activity slowly  Complete by: As directed      Allergies as of 12/30/2020      Reactions   Hyzaar [losartan Potassium-hctz] Other (See Comments)   Caused patient to feel light-headed and gave her  vertigo   Meloxicam Rash   A "very bad" rash      Medication List    STOP taking these medications   amiodarone 200 MG tablet Commonly known as: PACERONE   benzonatate 100 MG capsule Commonly known as: TESSALON   buPROPion 150 MG 12 hr tablet Commonly known as: Wellbutrin SR   busPIRone 7.5 MG tablet Commonly known as: BUSPAR   colchicine 0.6 MG tablet   diclofenac sodium 1 % Gel Commonly known as: Voltaren   doxycycline 100 MG capsule Commonly known as: VIBRAMYCIN   fluticasone 50 MCG/ACT nasal spray Commonly known as: FLONASE   hydrocortisone 2.5 % cream   nicotine 21 mg/24hr patch Commonly known as: NICODERM CQ - dosed in mg/24 hours   oxyCODONE-acetaminophen 5-325 MG tablet Commonly known as: PERCOCET/ROXICET   traMADol 50 MG tablet Commonly known as: ULTRAM     TAKE these medications   acetaminophen 500 MG tablet Commonly known as: TYLENOL Take 500 mg by mouth every 6 (six) hours as needed for moderate pain. What changed: Another medication with the same name was removed. Continue taking this medication, and follow the directions you see here.   albuterol 108 (90 Base) MCG/ACT inhaler Commonly known as: VENTOLIN HFA Inhale 2 puffs into the lungs every 6 (six) hours as needed for wheezing or shortness of breath. What changed: how much to take   atorvastatin 20 MG tablet Commonly known as: LIPITOR TAKE 1 TABLET BY MOUTH EVERY DAY. Must have office visit for refills   carbamide peroxide 6.5 % OTIC solution Commonly known as: DEBROX Place 5 drops into both ears 2 (two) times daily.   clonazePAM 0.5 MG tablet Commonly known as: KLONOPIN Take 0.5 mg by mouth 2 (two) times daily as needed for anxiety.   Eliquis 5 MG Tabs tablet Generic drug: apixaban TAKE 1 TABLET (5 MG TOTAL) BY MOUTH 2 (TWO) TIMES DAILY.   levalbuterol 0.31 MG/3ML nebulizer solution Commonly known as: XOPENEX Take 3 mLs (0.31 mg total) by nebulization every 4 (four) hours as  needed for wheezing.   levothyroxine 100 MCG tablet Commonly known as: SYNTHROID Take 100 mcg by mouth daily. What changed: Another medication with the same name was removed. Continue taking this medication, and follow the directions you see here.   metoprolol tartrate 50 MG tablet Commonly known as: LOPRESSOR Take 1 tablet (50 mg total) by mouth 2 (two) times daily.   mirtazapine 30 MG tablet Commonly known as: REMERON Take 30 mg by mouth at bedtime.   predniSONE 10 MG tablet Commonly known as: DELTASONE Take 4 tablets (40 mg total) by mouth daily for 3 days, THEN 3 tablets (30 mg total) daily for 3 days, THEN 2 tablets (20 mg total) daily for 3 days, THEN 1 tablet (10 mg total) daily for 3 days. Start taking on: Dec 30, 2020   sertraline 100 MG tablet Commonly known as: ZOLOFT TAKE 1 TABLET BY MOUTH EVERY DAY FOR ANXIETY AND DEPRESSION            Durable Medical Equipment  (From admission, onward)         Start     Ordered   12/30/20 0000  For home use only DME Nebulizer machine       Question  Answer Comment  Patient needs a nebulizer to treat with the following condition COPD (chronic obstructive pulmonary disease) (HCC)   Length of Need Lifetime      12/30/20 1307          Allergies  Allergen Reactions  . Hyzaar [Losartan Potassium-Hctz] Other (See Comments)    Caused patient to feel light-headed and gave her vertigo  . Meloxicam Rash    A "very bad" rash    Consultations: None   Procedures/Studies: DG Chest Portable 1 View  Result Date: 12/28/2020 CLINICAL DATA:  Cough EXAM: PORTABLE CHEST 1 VIEW COMPARISON:  03/17/2020 FINDINGS: The heart size and mediastinal contours are within normal limits. Large hiatal hernia. Both lungs are clear. The visualized skeletal structures are unremarkable. IMPRESSION: No active disease.  Large hiatal hernia Electronically Signed   By: Jasmine Pang M.D.   On: 12/28/2020 18:06      Subjective: No acute issues or  events overnight, heart rate well controlled denies nausea vomiting diarrhea constipation headache fevers or chills.   Discharge Exam: Vitals:   12/30/20 0630 12/30/20 1344  BP:  107/82  Pulse: 95 (!) 119  Resp: 18 17  Temp:  98.4 F (36.9 C)  SpO2: 96%    Vitals:   12/30/20 0357 12/30/20 0600 12/30/20 0630 12/30/20 1344  BP: 100/87 107/71  107/82  Pulse: 76 73 95 (!) 119  Resp: Temp:    98.4 F (36.9 C)  TempSrc:      SpO2: 94% 91% 96%   Weight:      Height:        General: Pt is alert, awake, not in acute distress Cardiovascular: Irregularly irregular rate around 60 at rest, no rubs, no gallops Respiratory: CTA bilaterally, no wheezing, no rhonchi Abdominal: Soft, NT, ND, bowel sounds + Extremities: no edema, no cyanosis    The results of significant diagnostics from this hospitalization (including imaging, microbiology, ancillary and laboratory) are listed below for reference.     Microbiology: Recent Results (from the past 240 hour(s))  Resp Panel by RT-PCR (Flu A&B, Covid) Nasopharyngeal Swab     Status: None   Collection Time: 12/28/20  5:38 PM   Specimen: Nasopharyngeal Swab; Nasopharyngeal(NP) swabs in vial transport medium  Result Value Ref Range Status   SARS Coronavirus 2 by RT PCR NEGATIVE NEGATIVE Final    Comment: (NOTE) SARS-CoV-2 target nucleic acids are NOT DETECTED.  The SARS-CoV-2 RNA is generally detectable in upper respiratory specimens during the acute phase of infection. The lowest concentration of SARS-CoV-2 viral copies this assay can detect is 138 copies/mL. A negative result does not preclude SARS-Cov-2 infection and should not be used as the sole basis for treatment or other patient management decisions. A negative result may occur with  improper specimen collection/handling, submission of specimen other than nasopharyngeal swab, presence of viral mutation(s) within the areas targeted by this assay, and inadequate number  of viral copies(<138 copies/mL). A negative result must be combined with clinical observations, patient history, and epidemiological information. The expected result is Negative.  Fact Sheet for Patients:  BloggerCourse.com  Fact Sheet for Healthcare Providers:  SeriousBroker.it  This test is no t yet approved or cleared by the [place] FDA and  has been authorized for detection and/or diagnosis of SARS-CoV-2 by FDA under an Emergency Use Authorization (EUA). This EUA will remain  in effect (meaning this test can be used) for the duration of the COVID-19 declaration under Section 564(b)(1)  of the Act, 21 U.S.C.section 360bbb-3(b)(1), unless the authorization is terminated  or revoked sooner.       Influenza A by PCR NEGATIVE NEGATIVE Final   Influenza B by PCR NEGATIVE NEGATIVE Final    Comment: (NOTE) The Xpert Xpress SARS-CoV-2/FLU/RSV plus assay is intended as an aid in the diagnosis of influenza from Nasopharyngeal swab specimens and should not be used as a sole basis for treatment. Nasal washings and aspirates are unacceptable for Xpert Xpress SARS-CoV-2/FLU/RSV testing.  Fact Sheet for Patients: BloggerCourse.com  Fact Sheet for Healthcare Providers: SeriousBroker.it  This test is not yet approved or cleared by the [place] FDA and has been authorized for detection and/or diagnosis of SARS-CoV-2 by FDA under an Emergency Use Authorization (EUA). This EUA will remain in effect (meaning this test can be used) for the duration of the COVID-19 declaration under Section 564(b)(1) of the Act, 21 U.S.C. section 360bbb-3(b)(1), unless the authorization is terminated or revoked.  Performed at [facility], [address], [place], [place] [address]      Labs: BNP (last 3 results) No results for input(s): BNP in the last 8760 hours. Basic  Metabolic Panel: Recent Labs  Lab 12/28/20 1727 12/28/20 1749 12/29/20 0308 12/30/20 0351  NA 138  --  135 143  K 4.3  --  4.2 4.3  CL 102  --  101 107  CO2 23  --  21* 27  GLUCOSE 110*  --  193* 103*  BUN 11  --  10 16  CREATININE 0.91  --  0.97 0.82  CALCIUM 9.0  --  8.8* 9.4  MG  --  1.8  --   --    Liver Function Tests: No results for input(s): AST, ALT, ALKPHOS, BILITOT, PROT, ALBUMIN in the last 168 hours. No results for input(s): LIPASE, AMYLASE in the last 168 hours. No results for input(s): AMMONIA in the last 168 hours. CBC: Recent Labs  Lab 12/28/20 1727 12/29/20 0308 12/30/20 0351  WBC 4.4 2.4* 5.6  NEUTROABS 2.2  --   --   HGB 12.4 12.0 11.1*  HCT 39.1 37.8 35.5*  MCV 94.4 95.9 97.5  PLT 210 170 180   Cardiac Enzymes: No results for input(s): CKTOTAL, CKMB, CKMBINDEX, TROPONINI in the last 168 hours. BNP: Invalid input(s): POCBNP CBG: No results for input(s): GLUCAP in the last 168 hours. D-Dimer No results for input(s): DDIMER in the last 72 hours. Hgb A1c No results for input(s): HGBA1C in the last 72 hours. Lipid Profile No results for input(s): CHOL, HDL, LDLCALC, TRIG, CHOLHDL, LDLDIRECT in the last 72 hours. Thyroid function studies Recent Labs    12/29/20 0308  TSH 0.566   Anemia work up No results for input(s): VITAMINB12, FOLATE, FERRITIN, TIBC, IRON, RETICCTPCT in the last 72 hours. Urinalysis    Component Value Date/Time   COLORURINE YELLOW 06/12/2012 1705   APPEARANCEUR CLEAR 06/12/2012 1705   LABSPEC 1.024 06/12/2012 1705   PHURINE 5.0 06/12/2012 1705   GLUCOSEU NEGATIVE 06/12/2012 1705   HGBUR NEGATIVE 06/12/2012 1705   BILIRUBINUR NEGATIVE 06/12/2012 1705   KETONESUR NEGATIVE 06/12/2012 1705   PROTEINUR 30 (A) 06/12/2012 1705   UROBILINOGEN 0.2 06/12/2012 1705   NITRITE NEGATIVE 06/12/2012 1705   LEUKOCYTESUR NEGATIVE 06/12/2012 1705   Sepsis Labs Invalid input(s): PROCALCITONIN,  WBC,   LACTICIDVEN Microbiology Recent Results (from the past 240 hour(s))  Resp Panel by RT-PCR (Flu A&B, Covid) Nasopharyngeal Swab     Status: None  Collection Time: 12/28/20  5:38 PM   Specimen: Nasopharyngeal Swab; Nasopharyngeal(NP) swabs in vial transport medium  Result Value Ref Range Status   SARS Coronavirus 2 by RT PCR NEGATIVE NEGATIVE Final    Comment: (NOTE) SARS-CoV-2 target nucleic acids are NOT DETECTED.  The SARS-CoV-2 RNA is generally detectable in upper respiratory specimens during the acute phase of infection. The lowest concentration of SARS-CoV-2 viral copies this assay can detect is 138 copies/mL. A negative result does not preclude SARS-Cov-2 infection and should not be used as the sole basis for treatment or other patient management decisions. A negative result may occur with  improper specimen collection/handling, submission of specimen other than nasopharyngeal swab, presence of viral mutation(s) within the areas targeted by this assay, and inadequate number of viral copies(<138 copies/mL). A negative result must be combined with clinical observations, patient history, and epidemiological information. The expected result is Negative.  Fact Sheet for Patients:  BloggerCourse.comhttps://www.fda.gov/media/152166/download  Fact Sheet for Healthcare Providers:  SeriousBroker.ithttps://www.fda.gov/media/152162/download  This test is no t yet approved or cleared by the [place] FDA and  has been authorized for detection and/or diagnosis of SARS-CoV-2 by FDA under an Emergency Use Authorization (EUA). This EUA will remain  in effect (meaning this test can be used) for the duration of the COVID-19 declaration under Section 564(b)(1) of the Act, 21 U.S.C.section 360bbb-3(b)(1), unless the authorization is terminated  or revoked sooner.       Influenza A by PCR NEGATIVE NEGATIVE Final   Influenza B by PCR NEGATIVE NEGATIVE Final    Comment: (NOTE) The Xpert Xpress SARS-CoV-2/FLU/RSV plus  assay is intended as an aid in the diagnosis of influenza from Nasopharyngeal swab specimens and should not be used as a sole basis for treatment. Nasal washings and aspirates are unacceptable for Xpert Xpress SARS-CoV-2/FLU/RSV testing.  Fact Sheet for Patients: BloggerCourse.comhttps://www.fda.gov/media/152166/download  Fact Sheet for Healthcare Providers: SeriousBroker.ithttps://www.fda.gov/media/152162/download  This test is not yet approved or cleared by the [place] FDA and has been authorized for detection and/or diagnosis of SARS-CoV-2 by FDA under an Emergency Use Authorization (EUA). This EUA will remain in effect (meaning this test can be used) for the duration of the COVID-19 declaration under Section 564(b)(1) of the Act, 21 U.S.C. section 360bbb-3(b)(1), unless the authorization is terminated or revoked.  Performed at [facility], [address] [place], [place] [address]      Time coordinating discharge: Over 30 minutes  SIGNED:   Azucena FallenWilliam C Zanita Millman, DO [facility] 12/30/2020, 2:57 PM Pager   If 7PM-7AM, please contact night-coverage [email or web address]

## 2021-01-01 ENCOUNTER — Telehealth: Payer: Self-pay

## 2021-01-01 NOTE — Telephone Encounter (Signed)
Transition Care Management Unsuccessful Follow-up Telephone Call  Date of discharge and from where:  12/30/2020, [facility]   Attempts:  1st Attempt  Reason for unsuccessful TCM follow-up call:  Left voice message on # [phone].  Call also placed to # [phone] and the phone just rings, no option to leave a message.  Dr Alvis Lemmings is listed as her PCP but need to confirm if she plans to follow up with Dr Alvis Lemmings or has established care with a new PCP.

## 2021-01-02 ENCOUNTER — Telehealth: Payer: Self-pay

## 2021-01-02 NOTE — Telephone Encounter (Signed)
Transition Care Management Follow-up Telephone Call  Date of discharge and from where: 12/30/2020, [facility]   How have you been since you were released from the hospital? The patient said she is doing okay.  She now has a new PCP- Dr Antony Haste and has a follow up appointment scheduled with him   Dr Alvis Lemmings removed from [facility] as PCP.

## 2021-01-14 ENCOUNTER — Other Ambulatory Visit: Payer: Self-pay

## 2021-01-14 ENCOUNTER — Ambulatory Visit (HOSPITAL_COMMUNITY)
Admission: EM | Admit: 2021-01-14 | Discharge: 2021-01-14 | Disposition: A | Discharge status: Home / Self Care | Payer: Medicare (Managed Care) | Attending: Family Medicine | Admitting: Family Medicine

## 2021-01-14 DIAGNOSIS — I4891 Unspecified atrial fibrillation: Secondary | ICD-10-CM | POA: Diagnosis not present

## 2021-01-14 DIAGNOSIS — R000 Tachycardia, unspecified: Secondary | ICD-10-CM | POA: Diagnosis not present

## 2021-01-14 DIAGNOSIS — M109 Gout, unspecified: Secondary | ICD-10-CM | POA: Diagnosis not present

## 2021-01-14 MED ORDER — COLCHICINE 0.6 MG PO TABS: ORAL_TABLET | ORAL | 0 refills | Status: DC

## 2021-01-14 NOTE — Discharge Instructions (Signed)
May try voltaren gel topically on the foot to help with pain and inflammation from gout flare  Call Cardiologist first thing tomorrow morning - increase metoprolol until then to 100 mg BID and log home BPs and HRs

## 2021-01-14 NOTE — ED Triage Notes (Addendum)
Patient has a history of gout ,  Patient reports gout is the issue in left foot.  Patient noticed this yesterday and pain is significant today  Has been drinking black cherry jiuce since last night

## 2021-01-14 NOTE — ED Provider Notes (Signed)
[facility]    CSN: [id number] Arrival date & time: 01/14/21  1509      History   Chief Complaint Chief Complaint  Patient presents with  . Gout    HPI Anita Russell is a 65 y.o. female.   Patient presenting today with 2-day history of left foot gout flare that has spread from just being the great toe to now all across each toe.  Significant redness, swelling, warmth and is unable to bear weight on the foot at this time.  She states she drank some tart cherry juice at onset which seemed to subside it for the time being but it got worse when she woke up yesterday.  She denies injury to the foot, numbness, tingling, radiation of pain upward, fever.  Tylenol not helping with pain relief.     Past Medical History:  Diagnosis Date  . Anxiety   . Asthma   . Depression   . Gout   . Hypertension   . Hypothyroidism   . Mental disorder   . Thyroid disease     Patient Active Problem List   Diagnosis Date Noted  . Acute respiratory failure with hypoxia (HCC) 12/28/2020  . Hypomagnesemia 12/28/2020  . Prolonged QT interval 12/28/2020  . Fall 12/28/2020  . Elbow dislocation 03/05/2019  . Atrial flutter (HCC) 07/09/2018  . SVT (supraventricular tachycardia) (HCC) 06/11/2017  . Cervical high risk HPV (human papillomavirus) test positive 04/01/2017  . Asthma 02/04/2017  . Hyperlipidemia 07/05/2016  . Loss of weight 07/13/2015  . Chronic RUQ pain 07/13/2015  . Diarrhea 07/13/2015  . Smoking 04/08/2014  . IFG (impaired fasting glucose) 04/08/2014  . Arthritis 10/29/2013  . Anxiety and depression 10/29/2013  . Periodic health assessment, general screening, adult 10/29/2013  . Tinnitus 10/29/2013  . Anxiety 06/17/2012  . Alcohol abuse 06/14/2012  . Gout 06/14/2012  . Hypertension 06/14/2012  . Hypothyroidism 06/14/2012    Past Surgical History:  Procedure Laterality Date  . ABDOMINAL HYSTERECTOMY    . APPENDECTOMY    . CARDIOVERSION N/A 06/16/2017    Procedure: CARDIOVERSION;  Surgeon: Orpah Cobb, MD;  Location: [facility] ENDOSCOPY;  Service: Cardiovascular;  Laterality: N/A;  . TEE WITHOUT CARDIOVERSION N/A 06/16/2017   Procedure: TRANSESOPHAGEAL ECHOCARDIOGRAM (TEE);  Surgeon: Orpah Cobb, MD;  Location: [facility] ENDOSCOPY;  Service: Cardiovascular;  Laterality: N/A;    OB History    Gravida  2   Para      Term      Preterm      AB  1   Living  1     SAB      IAB      Ectopic      Multiple      Live Births  1            Home Medications    Prior to Admission medications   Medication Sig Start Date End Date Taking? Authorizing Provider  acetaminophen (TYLENOL) 500 MG tablet Take 500 mg by mouth every 6 (six) hours as needed for moderate pain.   Yes [provider]  colchicine 0.6 MG tablet Take 2 tabs at onset of gout flare, repeat 1 tab in 1 hour if not improving. May repeat daily until resolved 01/14/21  Yes Particia Nearing, PA-C  albuterol (VENTOLIN HFA) 108 (90 Base) MCG/ACT inhaler Inhale 2 puffs into the lungs every 6 (six) hours as needed for wheezing or shortness of breath. 12/30/20   Azucena Fallen, MD  atorvastatin (LIPITOR)  20 MG tablet TAKE 1 TABLET BY MOUTH EVERY DAY. Must have office visit for refills 11/07/20   Hoy Register, MD  carbamide peroxide (DEBROX) 6.5 % OTIC solution Place 5 drops into both ears 2 (two) times daily.    [provider]  clonazePAM (KLONOPIN) 0.5 MG tablet Take 0.5 mg by mouth 2 (two) times daily as needed for anxiety. 11/06/20   [provider]  ELIQUIS 5 MG TABS tablet TAKE 1 TABLET (5 MG TOTAL) BY MOUTH 2 (TWO) TIMES DAILY. 04/07/20   Hoy Register, MD  levalbuterol (XOPENEX) 0.31 MG/3ML nebulizer solution Take 3 mLs (0.31 mg total) by nebulization every 4 (four) hours as needed for wheezing. 12/30/20 12/30/21  Azucena Fallen, MD  levothyroxine (SYNTHROID) 100 MCG tablet Take 100 mcg by mouth daily. 09/30/20   [provider]   metoprolol tartrate (LOPRESSOR) 50 MG tablet Take 1 tablet (50 mg total) by mouth 2 (two) times daily. 04/20/19   Hoy Register, MD  mirtazapine (REMERON) 30 MG tablet Take 30 mg by mouth at bedtime. 10/31/20   [provider]  sertraline (ZOLOFT) 100 MG tablet TAKE 1 TABLET BY MOUTH EVERY DAY FOR ANXIETY AND DEPRESSION 10/31/20   Hoy Register, MD    Family History Family History  Problem Relation Age of Onset  . Arthritis Father   . Hypertension Father   . Gout Father   . Hypertension Mother     Social History Social History   Tobacco Use  . Smoking status: Current Every Day Smoker    Packs/day: 1.00    Types: Cigarettes  . Smokeless tobacco: Never Used  Vaping Use  . Vaping Use: Never used  Substance Use Topics  . Alcohol use: No    Alcohol/week: 4.0 - 7.0 standard drinks    Types: 1 - 3 Glasses of wine, 3 - 4 Cans of beer per week    Comment: 3 times weekly  . Drug use: No     Allergies   Hyzaar [losartan potassium-hctz] and Meloxicam   Review of Systems Review of Systems Per HPI  Physical Exam Triage Vital Signs ED Triage Vitals  Enc Vitals Group     BP 01/14/21 1628 (!) 125/97     Pulse Rate 01/14/21 1628 (!) 124     Resp 01/14/21 1628 18     Temp 01/14/21 1628 98.5 F (36.9 C)     Temp Source 01/14/21 1628 Oral     SpO2 01/14/21 1628 100 %     Weight --      Height --      Head Circumference --      Peak Flow --      Pain Score 01/14/21 1623 10     Pain Loc --      Pain Edu? --      Excl. in GC? --    No data found.  Updated Vital Signs BP (!) 125/97 (BP Location: Right Arm)   Pulse (!) 124   Temp 98.5 F (36.9 C) (Oral)   Resp 18   SpO2 100%   Visual Acuity Right Eye Distance:   Left Eye Distance:   Bilateral Distance:    Right Eye Near:   Left Eye Near:    Bilateral Near:     Physical Exam Vitals and nursing note reviewed.  Constitutional:      Appearance: Normal appearance. She is not ill-appearing.  HENT:      Head: Atraumatic.  Eyes:  Extraocular Movements: Extraocular movements intact.     Conjunctiva/sclera: Conjunctivae normal.  Cardiovascular:     Rate and Rhythm: Regular rhythm. Tachycardia present.     Heart sounds: Normal heart sounds.  Pulmonary:     Effort: Pulmonary effort is normal.     Breath sounds: Normal breath sounds.  Musculoskeletal:        General: Swelling and tenderness present. No signs of injury.     Cervical back: Normal range of motion and neck supple.     Comments: Significant diffuse erythema, edema and warmth across all 5 toes of left foot.  Range of motion intact but very painful severely tender to palpation diffusely  Skin:    General: Skin is warm and dry.     Findings: Erythema present.  Neurological:     Mental Status: She is alert and oriented to person, place, and time.     Motor: No weakness.     Gait: Gait abnormal.     Comments: Left lower extremity neurovascularly intact Gait significantly antalgic, favoring the left foot  Psychiatric:        Mood and Affect: Mood normal.        Thought Content: Thought content normal.        Judgment: Judgment normal.      UC Treatments / Results  Labs (all labs ordered are listed, but only abnormal results are displayed) Labs Reviewed - No data to display  EKG   Radiology No results found.  Procedures Procedures (including critical care time)  Medications Ordered in UC Medications - No data to display  Initial Impression / Assessment and Plan / UC Course  I have reviewed the triage vital signs and the nursing notes.  Pertinent labs & imaging results that were available during my care of the patient were reviewed by me and considered in my medical decision making (see chart for details).     Patient was noted to be tachycardic to 124 bpm in triage so EKG was performed given her history of atrial fibrillation with recent admission for A. fib with RVR.  Per chart review, she last saw her  cardiologist 4 days ago and her metoprolol was increased to 75 mg twice daily which she has been compliant with.  She states home heart rates have been ranging from the mid 120s to low 130s consistently.  She has had no chest pain, dizziness, shortness of breath, syncopal episodes since dose change and declines going to the ED for evaluation of this.  We will increase her metoprolol to 100 mg twice daily until she can call her cardiologist in the morning and further discussed the ongoing tachycardia.  EKG today showing sinus tachycardia with no ST or T wave changes and no rhythm abnormality.  Regarding her gout, will treat with colchicine and Voltaren gel.  Continue tart cherry supplements additionally and Tylenol as needed for pain relief.  Follow-up with PCP on this, particularly if recurring.  Diet modifications reviewed additionally.  Final Clinical Impressions(s) / UC Diagnoses   Final diagnoses:  Sinus tachycardia  Atrial fibrillation, unspecified type (HCC)  Acute gout of left foot, unspecified cause     Discharge Instructions     May try voltaren gel topically on the foot to help with pain and inflammation from gout flare  Call Cardiologist first thing tomorrow morning - increase metoprolol until then to 100 mg BID and log home BPs and HRs    ED Prescriptions    Medication Sig Dispense  Auth. Provider   colchicine 0.6 MG tablet Take 2 tabs at onset of gout flare, repeat 1 tab in 1 hour if not improving. May repeat daily until resolved 12 tablet Particia NearingLane, Irini Leet [place], [place]     PDMP not reviewed this encounter.   Particia NearingLane, Gildo Crisco [place], [place] 01/14/21 1744

## 2021-02-05 ENCOUNTER — Other Ambulatory Visit: Payer: Self-pay | Admitting: Family Medicine

## 2021-02-15 ENCOUNTER — Other Ambulatory Visit: Payer: Self-pay

## 2021-02-15 ENCOUNTER — Encounter (HOSPITAL_COMMUNITY): Payer: Self-pay

## 2021-02-15 ENCOUNTER — Ambulatory Visit (HOSPITAL_COMMUNITY)
Admission: EM | Admit: 2021-02-15 | Discharge: 2021-02-15 | Disposition: A | Discharge status: Home / Self Care | Payer: Medicare (Managed Care)

## 2021-02-15 DIAGNOSIS — R000 Tachycardia, unspecified: Secondary | ICD-10-CM | POA: Diagnosis not present

## 2021-02-15 DIAGNOSIS — M10071 Idiopathic gout, right ankle and foot: Secondary | ICD-10-CM

## 2021-02-15 MED ORDER — COLCHICINE 0.6 MG PO TABS: ORAL_TABLET | ORAL | 1 refills | Status: DC

## 2021-02-15 NOTE — ED Triage Notes (Signed)
Pt c/o intermittent pain to right foot that started this morning. Foot is tender and warm to touch, with some swelling. Patient has not tried any interventions. Patient not able to bare weight on foot. Patient also c/o having some diarrhea, LBM today (no abdomen or flank pain noted per patient).

## 2021-02-15 NOTE — ED Provider Notes (Signed)
[facility]    CSN: [id number] Arrival date & time: 02/15/21  1302      History   Chief Complaint Chief Complaint  Patient presents with   Gout    HPI Anita Russell is a 65 y.o. female.   Patient presenting today with 1 day history of acute onset redness, swelling, significant pain to right great toe.  Denies injury to the area.  History of frequent gout flares, most recently last month improved with colchicine.  Denies numbness, tingling, decreased range of motion though significantly painful to bear weight, fever, chills, skin injury.  So far taking over-the-counter pain relievers with minimal relief.   Past Medical History:  Diagnosis Date   Anxiety    Asthma    Depression    Gout    Hypertension    Hypothyroidism    Mental disorder    Thyroid disease     Patient Active Problem List   Diagnosis Date Noted   Acute respiratory failure with hypoxia (HCC) 12/28/2020   Hypomagnesemia 12/28/2020   Prolonged QT interval 12/28/2020   Fall 12/28/2020   Elbow dislocation 03/05/2019   Atrial flutter (HCC) 07/09/2018   SVT (supraventricular tachycardia) (HCC) 06/11/2017   Cervical high risk HPV (human papillomavirus) test positive 04/01/2017   Asthma 02/04/2017   Hyperlipidemia 07/05/2016   Loss of weight 07/13/2015   Chronic RUQ pain 07/13/2015   Diarrhea 07/13/2015   Smoking 04/08/2014   IFG (impaired fasting glucose) 04/08/2014   Arthritis 10/29/2013   Anxiety and depression 10/29/2013   Periodic health assessment, general screening, adult 10/29/2013   Tinnitus 10/29/2013   Anxiety 06/17/2012   Alcohol abuse 06/14/2012   Gout 06/14/2012   Hypertension 06/14/2012   Hypothyroidism 06/14/2012    Past Surgical History:  Procedure Laterality Date   ABDOMINAL HYSTERECTOMY     APPENDECTOMY     CARDIOVERSION N/A 06/16/2017   Procedure: CARDIOVERSION;  Surgeon: Orpah Cobb, MD;  Location: [facility] ENDOSCOPY;  Service: Cardiovascular;  Laterality: N/A;    TEE WITHOUT CARDIOVERSION N/A 06/16/2017   Procedure: TRANSESOPHAGEAL ECHOCARDIOGRAM (TEE);  Surgeon: Orpah Cobb, MD;  Location: [facility] ENDOSCOPY;  Service: Cardiovascular;  Laterality: N/A;    OB History     Gravida  2   Para      Term      Preterm      AB  1   Living  1      SAB      IAB      Ectopic      Multiple      Live Births  1            Home Medications    Prior to Admission medications   Medication Sig Start Date End Date Taking? Authorizing Provider  atorvastatin (LIPITOR) 20 MG tablet TAKE 1 TABLET BY MOUTH EVERY DAY. Must have office visit for refills 11/07/20  Yes Newlin, [name], MD  busPIRone (BUSPAR) 7.5 MG tablet Take by mouth. 01/29/21  Yes [provider]  ELIQUIS 5 MG TABS tablet TAKE 1 TABLET (5 MG TOTAL) BY MOUTH 2 (TWO) TIMES DAILY. 04/07/20  Yes Hoy Register, MD  levothyroxine (SYNTHROID) 100 MCG tablet Take 100 mcg by mouth daily. 09/30/20  Yes [provider]  magnesium oxide (MAG-OX) 400 MG tablet Take by mouth. 01/04/21  Yes [provider]  metoprolol tartrate (LOPRESSOR) 50 MG tablet Take 1 tablet (50 mg total) by mouth 2 (two) times daily. 04/20/19  Yes Hoy Register, MD  mirtazapine (  REMERON) 30 MG tablet Take 30 mg by mouth at bedtime. 10/31/20  Yes [provider]  sertraline (ZOLOFT) 100 MG tablet TAKE 1 TABLET BY MOUTH EVERY DAY FOR ANXIETY AND DEPRESSION 10/31/20  Yes Hoy Register, MD  acetaminophen (TYLENOL) 500 MG tablet Take 500 mg by mouth every 6 (six) hours as needed for moderate pain.    [provider]  albuterol (VENTOLIN HFA) 108 (90 Base) MCG/ACT inhaler Inhale 2 puffs into the lungs every 6 (six) hours as needed for wheezing or shortness of breath. 12/30/20   Azucena Fallen, MD  carbamide peroxide (DEBROX) 6.5 % OTIC solution Place 5 drops into both ears 2 (two) times daily.    [provider]  clonazePAM (KLONOPIN) 0.5 MG tablet Take 0.5 mg by mouth 2 (two)  times daily as needed for anxiety. 11/06/20   [provider]  clonazePAM (KLONOPIN) 0.5 MG tablet Take by mouth. 02/09/21   [provider]  colchicine 0.6 MG tablet Take 2 tabs at onset of gout flare, repeat 1 tab in 1 hour if not improving. May repeat daily until resolved 02/15/21   Particia Nearing, PA-C  levalbuterol [facility]) 0.31 MG/3ML nebulizer solution Take 3 mLs (0.31 mg total) by nebulization every 4 (four) hours as needed for wheezing. 12/30/20 12/30/21  Azucena Fallen, MD    Family History Family History  Problem Relation Age of Onset   Arthritis Father    Hypertension Father    Gout Father    Hypertension Mother     Social History Social History   Tobacco Use   Smoking status: Former    Packs/day: 1.00    Pack years: 0.00    Types: Cigarettes   Smokeless tobacco: Never  Vaping Use   Vaping Use: Never used  Substance Use Topics   Alcohol use: Not Currently    Alcohol/week: 4.0 - 7.0 standard drinks    Types: 1 - 3 Glasses of wine, 3 - 4 Cans of beer per week    Comment: 3 times weekly   Drug use: Yes    Types: Marijuana     Allergies   Hyzaar [losartan potassium-hctz] and Meloxicam   Review of Systems Review of Systems Per HPI  Physical Exam Triage Vital Signs ED Triage Vitals  Enc Vitals Group     BP 02/15/21 1436 (!) 138/93     Pulse Rate 02/15/21 1436 (!) 118     Resp 02/15/21 1436 20     Temp 02/15/21 1436 98.2 F (36.8 C)     Temp Source 02/15/21 1436 Oral     SpO2 02/15/21 1436 100 %     Weight --      Height --      Head Circumference --      Peak Flow --      Pain Score 02/15/21 1428 10     Pain Loc --      Pain Edu? --      Excl. in GC? --    No data found.  Updated Vital Signs BP (!) 138/93 (BP Location: Left Arm)   Pulse (!) 118   Temp 98.2 F (36.8 C) (Oral)   Resp 20   SpO2 100%   Visual Acuity Right Eye Distance:   Left Eye Distance:   Bilateral Distance:    Right Eye Near:   Left Eye  Near:    Bilateral Near:     Physical Exam Vitals and nursing note reviewed.  Constitutional:  Appearance: Normal appearance. She is not ill-appearing.  HENT:     Head: Atraumatic.  Eyes:     Extraocular Movements: Extraocular movements intact.     Conjunctiva/sclera: Conjunctivae normal.  Cardiovascular:     Rate and Rhythm: Normal rate and regular rhythm.     Heart sounds: Normal heart sounds.  Pulmonary:     Effort: Pulmonary effort is normal.     Breath sounds: Normal breath sounds.  Musculoskeletal:     Cervical back: Normal range of motion and neck supple.     Comments: In wheelchair due to significant right foot pain and inability to bear weight due to this Good range of motion in affected area, swelling, redness, heat, significant tenderness to palpation at base of right great toe  Skin:    General: Skin is warm and dry.  Neurological:     Mental Status: She is alert and oriented to person, place, and time.     Comments: Right lower extremity neurovascularly intact  Psychiatric:        Mood and Affect: Mood normal.        Thought Content: Thought content normal.        Judgment: Judgment normal.   UC Treatments / Results  Labs (all labs ordered are listed, but only abnormal results are displayed) Labs Reviewed - No data to display  EKG   Radiology No results found.  Procedures Procedures (including critical care time)  Medications Ordered in UC Medications - No data to display  Initial Impression / Assessment and Plan / UC Course  I have reviewed the triage vital signs and the nursing notes.  Pertinent labs & imaging results that were available during my care of the patient were reviewed by me and considered in my medical decision making (see chart for details).     Consistent with a gout flare of the right great toe.  Discussed tart cherry supplements, colchicine as needed.  Follow-up with primary care provider for further instruction as she has  been having frequent gout flares recently.  Noted to be tachycardic in triage which is her baseline with her SVT.  She is closely followed by cardiology and currently asymptomatic.  Final Clinical Impressions(s) / UC Diagnoses   Final diagnoses:  Acute idiopathic gout of right foot  Sinus tachycardia     Discharge Instructions      May try tart cherry supplements over-the-counter to help control your gout     ED Prescriptions     Medication Sig Dispense Auth. Provider   colchicine 0.6 MG tablet Take 2 tabs at onset of gout flare, repeat 1 tab in 1 hour if not improving. May repeat daily until resolved 12 tablet Particia Nearing, [place]      PDMP not reviewed this encounter.   Particia Nearing, [place] 02/15/21 1646

## 2021-02-15 NOTE — Discharge Instructions (Addendum)
May try tart cherry supplements over-the-counter to help control your gout

## 2021-03-09 ENCOUNTER — Other Ambulatory Visit: Payer: Self-pay | Admitting: Family Medicine

## 2021-05-06 ENCOUNTER — Ambulatory Visit (HOSPITAL_COMMUNITY)
Admission: EM | Admit: 2021-05-06 | Discharge: 2021-05-06 | Disposition: A | Discharge status: Home / Self Care | Payer: Medicare (Managed Care) | Attending: Emergency Medicine | Admitting: Emergency Medicine

## 2021-05-06 ENCOUNTER — Other Ambulatory Visit: Payer: Self-pay

## 2021-05-06 ENCOUNTER — Encounter (HOSPITAL_COMMUNITY): Payer: Self-pay | Admitting: *Deleted

## 2021-05-06 DIAGNOSIS — M10072 Idiopathic gout, left ankle and foot: Secondary | ICD-10-CM | POA: Diagnosis not present

## 2021-05-06 MED ORDER — COLCHICINE 0.6 MG PO TABS: ORAL_TABLET | ORAL | 1 refills | Status: DC

## 2021-05-06 NOTE — ED Provider Notes (Signed)
[facility]    CSN: [id number] Arrival date & time: 05/06/21  1334      History   Chief Complaint Chief Complaint  Patient presents with   Foot Pain    HPI Anita Russell is a 65 y.o. female.   Patient presents with left great toe pain and swelling for 2 weeks.  Painful to bear weight.  Limited range of motion.  Denies any precipitating event, injury or trauma, numbness or tingling.  History of gout, no current medication use.  Has been using blackberry juice which has minimized symptoms but not resolved.  Past Medical History:  Diagnosis Date   Anxiety    Asthma    Depression    Gout    Hypertension    Hypothyroidism    Mental disorder    Thyroid disease     Patient Active Problem List   Diagnosis Date Noted   Acute respiratory failure with hypoxia (HCC) 12/28/2020   Hypomagnesemia 12/28/2020   Prolonged QT interval 12/28/2020   Fall 12/28/2020   Elbow dislocation 03/05/2019   Atrial flutter (HCC) 07/09/2018   SVT (supraventricular tachycardia) (HCC) 06/11/2017   Cervical high risk HPV (human papillomavirus) test positive 04/01/2017   Asthma 02/04/2017   Hyperlipidemia 07/05/2016   Loss of weight 07/13/2015   Chronic RUQ pain 07/13/2015   Diarrhea 07/13/2015   Smoking 04/08/2014   IFG (impaired fasting glucose) 04/08/2014   Arthritis 10/29/2013   Anxiety and depression 10/29/2013   Periodic health assessment, general screening, adult 10/29/2013   Tinnitus 10/29/2013   Anxiety 06/17/2012   Alcohol abuse 06/14/2012   Gout 06/14/2012   Hypertension 06/14/2012   Hypothyroidism 06/14/2012    Past Surgical History:  Procedure Laterality Date   ABDOMINAL HYSTERECTOMY     APPENDECTOMY     CARDIOVERSION N/A 06/16/2017   Procedure: CARDIOVERSION;  Surgeon: Orpah Cobb, MD;  Location: [facility];  Service: Cardiovascular;  Laterality: N/A;   TEE WITHOUT CARDIOVERSION N/A 06/16/2017   Procedure: TRANSESOPHAGEAL ECHOCARDIOGRAM (TEE);   Surgeon: Orpah Cobb, MD;  Location: [facility];  Service: Cardiovascular;  Laterality: N/A;    OB History     Gravida  2   Para      Term      Preterm      AB  1   Living  1      SAB      IAB      Ectopic      Multiple      Live Births  1            Home Medications    Prior to Admission medications   Medication Sig Start Date End Date Taking? Authorizing Provider  acetaminophen (TYLENOL) 500 MG tablet Take 500 mg by mouth every 6 (six) hours as needed for moderate pain.    [provider]  albuterol (VENTOLIN HFA) 108 (90 Base) MCG/ACT inhaler Inhale 2 puffs into the lungs every 6 (six) hours as needed for wheezing or shortness of breath. 12/30/20   Azucena Fallen, MD  atorvastatin (LIPITOR) 20 MG tablet TAKE 1 TABLET BY MOUTH EVERY DAY. Must have office visit for refills 11/07/20   Hoy Register, MD  busPIRone (BUSPAR) 7.5 MG tablet Take by mouth. 01/29/21   [provider]  carbamide peroxide (DEBROX) 6.5 % OTIC solution Place 5 drops into both ears 2 (two) times daily.    [provider]  clonazePAM (KLONOPIN) 0.5 MG tablet Take 0.5 mg by mouth 2 (  two) times daily as needed for anxiety. 11/06/20   [provider]  clonazePAM (KLONOPIN) 0.5 MG tablet Take by mouth. 02/09/21   [provider]  colchicine 0.6 MG tablet Take 2 tabs at onset of gout flare, repeat 1 tab in 1 hour if not improving. May repeat daily until resolved 05/06/21   Kalina Morabito R, NP  ELIQUIS 5 MG TABS tablet TAKE 1 TABLET (5 MG TOTAL) BY MOUTH 2 (TWO) TIMES DAILY. 04/07/20   Hoy Register, MD  levalbuterol (XOPENEX) 0.31 MG/3ML nebulizer solution Take 3 mLs (0.31 mg total) by nebulization every 4 (four) hours as needed for wheezing. 12/30/20 12/30/21  Azucena Fallen, MD  levothyroxine (SYNTHROID) 100 MCG tablet Take 100 mcg by mouth daily. 09/30/20   [provider]  magnesium oxide (MAG-OX) 400 MG tablet Take by mouth. 01/04/21    [provider]  metoprolol tartrate (LOPRESSOR) 50 MG tablet Take 1 tablet (50 mg total) by mouth 2 (two) times daily. 04/20/19   Hoy Register, MD  mirtazapine (REMERON) 30 MG tablet Take 30 mg by mouth at bedtime. 10/31/20   [provider]  sertraline (ZOLOFT) 100 MG tablet TAKE 1 TABLET BY MOUTH EVERY DAY FOR ANXIETY AND DEPRESSION 10/31/20   Hoy Register, MD    Family History Family History  Problem Relation Age of Onset   Hypertension Mother    Arthritis Father    Hypertension Father    Gout Father     Social History Social History   Tobacco Use   Smoking status: Former    Packs/day: 1.00    Types: Cigarettes   Smokeless tobacco: Never  Vaping Use   Vaping Use: Never used  Substance Use Topics   Alcohol use: Not Currently    Alcohol/week: 4.0 - 7.0 standard drinks    Types: 1 - 3 Glasses of wine, 3 - 4 Cans of beer per week    Comment: 3 times weekly   Drug use: Yes    Types: Marijuana     Allergies   Hyzaar [losartan potassium-hctz] and Meloxicam   Review of Systems Review of Systems  Constitutional: Negative.   Respiratory: Negative.    Cardiovascular: Negative.   Musculoskeletal:  Positive for joint swelling. Negative for arthralgias, back pain, gait problem, myalgias, neck pain and neck stiffness.  Skin: Negative.     Physical Exam Triage Vital Signs ED Triage Vitals  Enc Vitals Group     BP 05/06/21 1519 120/78     Pulse Rate 05/06/21 1519 (!) 135     Resp 05/06/21 1519 18     Temp 05/06/21 1519 99.2 F (37.3 C)     Temp src --      SpO2 05/06/21 1519 98 %     Weight --      Height --      Head Circumference --      Peak Flow --      Pain Score 05/06/21 1520 9     Pain Loc --      Pain Edu? --      Excl. in GC? --    No data found.  Updated Vital Signs BP 120/78   Pulse (!) 135   Temp 99.2 F (37.3 C)   Resp 18   SpO2 98%   Visual Acuity Right Eye Distance:   Left Eye Distance:   Bilateral Distance:     Right Eye Near:   Left Eye Near:    Bilateral Near:  Physical Exam Constitutional:      Appearance: Normal appearance. She is normal weight.  HENT:     Head: Normocephalic.  Eyes:     Extraocular Movements: Extraocular movements intact.  Pulmonary:     Effort: Pulmonary effort is normal.  Feet:     Comments: Erythema and moderate swelling to the left great toe, tenderness present with minimal palpation, range of motion intact, capillary refill less than 3, 2+ pedal pulse Neurological:     Mental Status: She is alert and oriented to person, place, and time. Mental status is at baseline.  Psychiatric:        Mood and Affect: Mood normal.        Behavior: Behavior normal.     UC Treatments / Results  Labs (all labs ordered are listed, but only abnormal results are displayed) Labs Reviewed - No data to display  EKG   Radiology No results found.  Procedures Procedures (including critical care time)  Medications Ordered in UC Medications - No data to display  Initial Impression / Assessment and Plan / UC Course  I have reviewed the triage vital signs and the nursing notes.  Pertinent labs & imaging results that were available during my care of the patient were reviewed by me and considered in my medical decision making (see chart for details).  Acute gout of left foot  Due to history and lack of injury or trauma to foot will move forward with treatment for gout, deferring x-ray, explained to patient, in agreement with plan of care  1.  Colchicine 0.6 mg as directed with 2.  Patient given handout explaining disease process as well as recommended diet to help prevent flareups 3.  Advised patient to follow-up with primary care for reevaluation for persistent or reoccurring symptoms 4.  Patient heart rate today 135, per history this is patient's baseline, being followed by cardiology, last seen on April 18, 2021 with plans of ablation Final Clinical Impressions(s) /  UC Diagnoses   Final diagnoses:  Acute idiopathic gout of left foot     Discharge Instructions      Can take 2 tablets of colchicine initially and then take 1 tablet in 1 hour if symptoms still present, may take 1 tablet of colchicine daily until symptoms have resolved  If having reoccurring episodes of gout please follow-up with primary care doctor so that you may be started on daily medication for prevention and management   ED Prescriptions     Medication Sig Dispense Auth. Provider   colchicine 0.6 MG tablet Take 2 tabs at onset of gout flare, repeat 1 tab in 1 hour if not improving. May repeat daily until resolved 12 tablet Allisha Harter, Elita Boone, NP      PDMP not reviewed this encounter.   Valinda Hoar, NP 05/06/21 1620

## 2021-05-06 NOTE — Discharge Instructions (Addendum)
Can take 2 tablets of colchicine initially and then take 1 tablet in 1 hour if symptoms still present, may take 1 tablet of colchicine daily until symptoms have resolved  If having reoccurring episodes of gout please follow-up with primary care doctor so that you may be started on daily medication for prevention and management

## 2021-05-06 NOTE — ED Triage Notes (Signed)
Pt reports pain in Lt foot . Pt reports pain is due to gout.

## 2021-07-12 ENCOUNTER — Encounter (HOSPITAL_COMMUNITY): Payer: Self-pay

## 2021-07-12 ENCOUNTER — Ambulatory Visit (HOSPITAL_COMMUNITY)
Admission: EM | Admit: 2021-07-12 | Discharge: 2021-07-12 | Disposition: A | Discharge status: Home / Self Care | Payer: Medicare (Managed Care) | Attending: Internal Medicine | Admitting: Internal Medicine

## 2021-07-12 ENCOUNTER — Other Ambulatory Visit: Payer: Self-pay

## 2021-07-12 DIAGNOSIS — M10071 Idiopathic gout, right ankle and foot: Secondary | ICD-10-CM

## 2021-07-12 MED ORDER — PREDNISONE 20 MG PO TABS: 40.0000 mg | ORAL_TABLET | Freq: Every day | ORAL | 0 refills | Status: AC

## 2021-07-12 NOTE — ED Provider Notes (Addendum)
[name]    CSN: [id number] Arrival date & time: 07/12/21  1038      History   Chief Complaint Chief Complaint  Patient presents with   Foot Pain    HPI Anita Russell is a 65 y.o. female with a history of gout comes to the urgent care with right great toe pain over the past 3 days.  Pain is sharp and throbbing, severe, aggravated by bearing weight and without any relieving factors.  It is associated with redness of the right great toe.  Patient denies any trauma or hitting her left great toe.  She has a history of gout.   HPI  Past Medical History:  Diagnosis Date   Anxiety    Asthma    Depression    Gout    Hypertension    Hypothyroidism    Mental disorder    Thyroid disease     Patient Active Problem List   Diagnosis Date Noted   Acute respiratory failure with hypoxia ([place]) 12/28/2020   Hypomagnesemia 12/28/2020   Prolonged QT interval 12/28/2020   Fall 12/28/2020   Elbow dislocation 03/05/2019   Atrial flutter ([place]) 07/09/2018   SVT (supraventricular tachycardia) ([place]) 06/11/2017   Cervical high risk HPV (human papillomavirus) test positive 04/01/2017   Asthma 02/04/2017   Hyperlipidemia 07/05/2016   Loss of weight 07/13/2015   Chronic RUQ pain 07/13/2015   Diarrhea 07/13/2015   Smoking 04/08/2014   IFG (impaired fasting glucose) 04/08/2014   Arthritis 10/29/2013   Anxiety and depression 10/29/2013   Periodic health assessment, general screening, adult 10/29/2013   Tinnitus 10/29/2013   Anxiety 06/17/2012   Alcohol abuse 06/14/2012   Gout 06/14/2012   Hypertension 06/14/2012   Hypothyroidism 06/14/2012    Past Surgical History:  Procedure Laterality Date   ABDOMINAL HYSTERECTOMY     APPENDECTOMY     CARDIOVERSION N/A 06/16/2017   Procedure: CARDIOVERSION;  Surgeon: Dixie Dials, MD;  Location: [facility] [place];  Service: Cardiovascular;  Laterality: N/A;   TEE WITHOUT CARDIOVERSION N/A 06/16/2017   Procedure: TRANSESOPHAGEAL  ECHOCARDIOGRAM (TEE);  Surgeon: Dixie Dials, MD;  Location: [facility] ENDOSCOPY;  Service: Cardiovascular;  Laterality: N/A;    OB History     Gravida  2   Para      Term      Preterm      AB  1   Living  1      SAB      IAB      Ectopic      Multiple      Live Births  1            Home Medications    Prior to Admission medications   Medication Sig Start Date End Date Taking? Authorizing Provider  predniSONE (DELTASONE) 20 MG tablet Take 2 tablets (40 mg total) by mouth daily for 7 days. 07/12/21 07/19/21 Yes Ketrina Boateng, Myrene Galas, MD  acetaminophen (TYLENOL) 500 MG tablet Take 500 mg by mouth every 6 (six) hours as needed for moderate pain.    [provider]  albuterol (VENTOLIN HFA) 108 (90 Base) MCG/ACT inhaler Inhale 2 puffs into the lungs every 6 (six) hours as needed for wheezing or shortness of breath. 12/30/20   Little Ishikawa, MD  atorvastatin (LIPITOR) 20 MG tablet TAKE 1 TABLET BY MOUTH EVERY DAY. Must have office visit for refills 11/07/20   Charlott Rakes, MD  busPIRone (BUSPAR) 7.5 MG tablet Take by mouth. 01/29/21   [provider]  carbamide peroxide (DEBROX) 6.5 % OTIC solution Place 5 drops into both ears 2 (two) times daily.    [provider]  clonazePAM (KLONOPIN) 0.5 MG tablet Take 0.5 mg by mouth 2 (two) times daily as needed for anxiety. 11/06/20   [provider]  clonazePAM (KLONOPIN) 0.5 MG tablet Take by mouth. 02/09/21   [provider]  colchicine 0.6 MG tablet Take 2 tabs at onset of gout flare, repeat 1 tab in 1 hour if not improving. May repeat daily until resolved 05/06/21   White, Adrienne R, NP  ELIQUIS 5 MG TABS tablet TAKE 1 TABLET (5 MG TOTAL) BY MOUTH 2 (TWO) TIMES DAILY. 04/07/20   Hoy Register, MD  levalbuterol (XOPENEX) 0.31 MG/3ML nebulizer solution Take 3 mLs (0.31 mg total) by nebulization every 4 (four) hours as needed for wheezing. 12/30/20 12/30/21  Azucena Fallen, MD   levothyroxine (SYNTHROID) 100 MCG tablet Take 100 mcg by mouth daily. 09/30/20   [provider]  magnesium oxide (MAG-OX) 400 MG tablet Take by mouth. 01/04/21   [provider]  metoprolol tartrate (LOPRESSOR) 50 MG tablet Take 1 tablet (50 mg total) by mouth 2 (two) times daily. 04/20/19   Hoy Register, MD  mirtazapine (REMERON) 30 MG tablet Take 30 mg by mouth at bedtime. 10/31/20   [provider]  sertraline (ZOLOFT) 100 MG tablet TAKE 1 TABLET BY MOUTH EVERY DAY FOR ANXIETY AND DEPRESSION 10/31/20   Hoy Register, MD    Family History Family History  Problem Relation Age of Onset   Hypertension Mother    Arthritis Father    Hypertension Father    Gout Father     Social History Social History   Tobacco Use   Smoking status: Former    Packs/day: 1.00    Types: Cigarettes   Smokeless tobacco: Never  Vaping Use   Vaping Use: Never used  Substance Use Topics   Alcohol use: Not Currently    Alcohol/week: 4.0 - 7.0 standard drinks    Types: 1 - 3 Glasses of wine, 3 - 4 Cans of beer per week    Comment: 3 times weekly   Drug use: Yes    Types: Marijuana     Allergies   Hyzaar [losartan potassium-hctz] and Meloxicam   Review of Systems Review of Systems  Constitutional: Negative.   HENT: Negative.    Gastrointestinal: Negative.   Musculoskeletal:  Positive for arthralgias and joint swelling. Negative for myalgias.  Skin:  Positive for color change.    Physical Exam Triage Vital Signs ED Triage Vitals  Enc Vitals Group     BP 07/12/21 1143 (!) 144/85     Pulse Rate 07/12/21 1143 (!) 52     Resp --      Temp 07/12/21 1143 98.6 F (37 C)     Temp Source 07/12/21 1143 Oral     SpO2 07/12/21 1143 97 %     Weight --      Height --      Head Circumference --      Peak Flow --      Pain Score 07/12/21 1152 9     Pain Loc --      Pain Edu? --      Excl. in GC? --    No data found.  Updated Vital Signs BP (!) 144/85 (BP Location:  Right Arm)   Pulse (!) 52   Temp 98.6 F (37 C) (Oral)  SpO2 97%   Visual Acuity Right Eye Distance:   Left Eye Distance:   Bilateral Distance:    Right Eye Near:   Left Eye Near:    Bilateral Near:     Physical Exam Vitals and nursing note reviewed.  Constitutional:      General: She is not in acute distress.    Appearance: She is not ill-appearing.  Cardiovascular:     Rate and Rhythm: Normal rate and regular rhythm.  Musculoskeletal:     Comments: Tenderness and swelling of the metatarsophalangeal joint of the right great toe.  Mild erythema.  Range of motion is with pain.  Neurological:     Mental Status: She is alert.     UC Treatments / Results  Labs (all labs ordered are listed, but only abnormal results are displayed) Labs Reviewed - No data to display  EKG   Radiology No results found.  Procedures Procedures (including critical care time)  Medications Ordered in UC Medications - No data to display  Initial Impression / Assessment and Plan / UC Course  I have reviewed the triage vital signs and the nursing notes.  Pertinent labs & imaging results that were available during my care of the patient were reviewed by me and considered in my medical decision making (see chart for details).     1.  Acute gout flare involving right great toe: Prednisone 40 mg orally daily for 7 days Return to urgent care if symptoms worsen Tylenol as needed for pain Maintain adequate hydration.  Final Clinical Impressions(s) / UC Diagnoses   Final diagnoses:  Acute idiopathic gout of right foot     Discharge Instructions      Please take medications as prescribed If you have worsening symptoms please return to urgent care to be reevaluated.     ED Prescriptions     Medication Sig Dispense Auth. Provider   predniSONE (DELTASONE) 20 MG tablet Take 2 tablets (40 mg total) by mouth daily for 7 days. 14 tablet Yandell Mcjunkins, Britta Mccreedy, MD      PDMP not reviewed  this encounter.   Merrilee Jansky, MD 07/12/21 1453    Merrilee Jansky, MD 07/12/21 [id number]

## 2021-07-12 NOTE — Discharge Instructions (Addendum)
Please take medications as prescribed °If you have worsening symptoms please return to urgent care to be reevaluated. ° °

## 2021-07-12 NOTE — ED Triage Notes (Signed)
Pt c/o rt foot pain, redness, and swelling x3days. Hx of gout and feels the same.

## 2021-07-24 ENCOUNTER — Encounter (HOSPITAL_COMMUNITY): Payer: Self-pay

## 2021-07-24 ENCOUNTER — Ambulatory Visit (HOSPITAL_COMMUNITY)
Admission: EM | Admit: 2021-07-24 | Discharge: 2021-07-24 | Disposition: A | Discharge status: Home / Self Care | Payer: Medicare (Managed Care) | Attending: Physician Assistant | Admitting: Physician Assistant

## 2021-07-24 ENCOUNTER — Other Ambulatory Visit: Payer: Self-pay

## 2021-07-24 DIAGNOSIS — M25571 Pain in right ankle and joints of right foot: Secondary | ICD-10-CM | POA: Diagnosis not present

## 2021-07-24 DIAGNOSIS — M79674 Pain in right toe(s): Secondary | ICD-10-CM | POA: Diagnosis not present

## 2021-07-24 DIAGNOSIS — I10 Essential (primary) hypertension: Secondary | ICD-10-CM | POA: Diagnosis not present

## 2021-07-24 DIAGNOSIS — M109 Gout, unspecified: Secondary | ICD-10-CM | POA: Diagnosis not present

## 2021-07-24 MED ORDER — COLCHICINE 0.6 MG PO TABS: ORAL_TABLET | ORAL | 0 refills | Status: DC

## 2021-07-24 NOTE — ED Provider Notes (Signed)
[facility]    CSN: [id number] Arrival date & time: 07/24/21  1054      History   Chief Complaint Chief Complaint  Patient presents with   Foot Pain    HPI Anita Russell is a 65 y.o. female.   Patient presents today with a several week history of right great toe pain that has spread to involve her right ankle.  She has a history of gout and was seen by our clinic on 07/12/2021 at which point she was given 7 days of 40 mg prednisone.  Reports completing course of medication which provided some improvement but never completely resolved symptoms.  She does not take any uric acid lowering medications but intends to discuss with her PCP given recurrent episodes.  Denies any increased alcohol consumption or dietary changes.  Denies any recent medication changes.  Reports pain is rated 10 on a 0-10 pain scale, described as sharp, worse with palpation or attempted ambulation, no alleviating factors identified.  Denies any numbness or paresthesias.  Denies any injury or increase in activity prior to symptom onset.  She has had ibuprofen and Tylenol without improvement of symptoms.  She is unable to take NSAIDs regularly due to chronic anticoagulation with Eliquis.  She has previously been prescribed colchicine which was effective and is interested in restarting this medication if appropriate today.  At the end of visit, patient requested information about smoking cessation medications.  Discussed that is not something that we typically prescribe in urgent care but can discuss this with her PCP.  Quickly reviewed smoking cessation medications including Chantix, Wellbutrin, nicotine replacement.  Recommended she continue with nicotine replacement discussed if Wellbutrin is an option for her with her PCP at follow-up appointment.  Also discussed smoking cessation strategies including gradually weaning daily amounts of cigarettes.   Past Medical History:  Diagnosis Date   Anxiety     Asthma    Depression    Gout    Hypertension    Hypothyroidism    Mental disorder    Thyroid disease     Patient Active Problem List   Diagnosis Date Noted   Acute respiratory failure with hypoxia ([place]) 12/28/2020   Hypomagnesemia 12/28/2020   Prolonged QT interval 12/28/2020   Fall 12/28/2020   Elbow dislocation 03/05/2019   Atrial flutter ([place]) 07/09/2018   SVT (supraventricular tachycardia) ([place]) 06/11/2017   Cervical high risk HPV (human papillomavirus) test positive 04/01/2017   Asthma 02/04/2017   Hyperlipidemia 07/05/2016   Loss of weight 07/13/2015   Chronic RUQ pain 07/13/2015   Diarrhea 07/13/2015   Smoking 04/08/2014   IFG (impaired fasting glucose) 04/08/2014   Arthritis 10/29/2013   Anxiety and depression 10/29/2013   Periodic health assessment, general screening, adult 10/29/2013   Tinnitus 10/29/2013   Anxiety 06/17/2012   Alcohol abuse 06/14/2012   Gout 06/14/2012   Hypertension 06/14/2012   Hypothyroidism 06/14/2012    Past Surgical History:  Procedure Laterality Date   ABDOMINAL HYSTERECTOMY     APPENDECTOMY     CARDIOVERSION N/A 06/16/2017   Procedure: CARDIOVERSION;  Surgeon: Dixie Dials, MD;  Location: [place];  Service: Cardiovascular;  Laterality: N/A;   TEE WITHOUT CARDIOVERSION N/A 06/16/2017   Procedure: TRANSESOPHAGEAL ECHOCARDIOGRAM (TEE);  Surgeon: Dixie Dials, MD;  Location: [facility] ENDOSCOPY;  Service: Cardiovascular;  Laterality: N/A;    OB History     Gravida  2   Para      Term      Preterm  AB  1   Living  1      SAB      IAB      Ectopic      Multiple      Live Births  1            Home Medications    Prior to Admission medications   Medication Sig Start Date End Date Taking? Authorizing Provider  acetaminophen (TYLENOL) 500 MG tablet Take 500 mg by mouth every 6 (six) hours as needed for moderate pain.    [provider]  albuterol (VENTOLIN HFA) 108 (90 Base) MCG/ACT inhaler  Inhale 2 puffs into the lungs every 6 (six) hours as needed for wheezing or shortness of breath. 12/30/20   Little Ishikawa, MD  atorvastatin (LIPITOR) 20 MG tablet TAKE 1 TABLET BY MOUTH EVERY DAY. Must have office visit for refills 11/07/20   Charlott Rakes, MD  busPIRone (BUSPAR) 7.5 MG tablet Take by mouth. 01/29/21   [provider]  carbamide peroxide (DEBROX) 6.5 % OTIC solution Place 5 drops into both ears 2 (two) times daily.    [provider]  clonazePAM (KLONOPIN) 0.5 MG tablet Take 0.5 mg by mouth 2 (two) times daily as needed for anxiety. 11/06/20   [provider]  clonazePAM (KLONOPIN) 0.5 MG tablet Take by mouth. 02/09/21   [provider]  colchicine 0.6 MG tablet Take 2 tabs at onset of gout flare.  Take 1 tablet/day thereafter until symptoms resolve 07/24/21   Mariselda Badalamenti K, PA-C  ELIQUIS 5 MG TABS tablet TAKE 1 TABLET (5 MG TOTAL) BY MOUTH 2 (TWO) TIMES DAILY. 04/07/20   Charlott Rakes, MD  levalbuterol (XOPENEX) 0.31 MG/3ML nebulizer solution Take 3 mLs (0.31 mg total) by nebulization every 4 (four) hours as needed for wheezing. 12/30/20 12/30/21  Little Ishikawa, MD  levothyroxine (SYNTHROID) 100 MCG tablet Take 100 mcg by mouth daily. 09/30/20   [provider]  magnesium oxide (MAG-OX) 400 MG tablet Take by mouth. 01/04/21   [provider]  metoprolol tartrate (LOPRESSOR) 50 MG tablet Take 1 tablet (50 mg total) by mouth 2 (two) times daily. 04/20/19   Charlott Rakes, MD  mirtazapine (REMERON) 30 MG tablet Take 30 mg by mouth at bedtime. 10/31/20   [provider]  sertraline (ZOLOFT) 100 MG tablet TAKE 1 TABLET BY MOUTH EVERY DAY FOR ANXIETY AND DEPRESSION 10/31/20   Charlott Rakes, MD    Family History Family History  Problem Relation Age of Onset   Hypertension Mother    Arthritis Father    Hypertension Father    Gout Father     Social History Social History   Tobacco Use   Smoking status: Former     Packs/day: 1.00    Types: Cigarettes   Smokeless tobacco: Never  Vaping Use   Vaping Use: Never used  Substance Use Topics   Alcohol use: Not Currently    Alcohol/week: 4.0 - 7.0 standard drinks    Types: 1 - 3 Glasses of wine, 3 - 4 Cans of beer per week    Comment: 3 times weekly   Drug use: Yes    Types: Marijuana     Allergies   Hyzaar [losartan potassium-hctz] and Meloxicam   Review of Systems Review of Systems  Constitutional:  Positive for activity change. Negative for appetite change, fatigue and fever.  Respiratory:  Negative for cough and shortness of breath.   Cardiovascular:  Negative for chest pain.  Gastrointestinal:  Negative for abdominal pain, diarrhea and nausea.  Musculoskeletal:  Positive for arthralgias, gait problem and joint swelling. Negative for myalgias.  Neurological:  Negative for dizziness, weakness, light-headedness, numbness and headaches.    Physical Exam Triage Vital Signs ED Triage Vitals [07/24/21 1322]  Enc Vitals Group     BP (!) 190/113     Pulse Rate 63     Resp 18     Temp 98.2 F (36.8 C)     Temp Source Oral     SpO2 97 %     Weight      Height      Head Circumference      Peak Flow      Pain Score 10     Pain Loc      Pain Edu?      Excl. in GC?    No data found.  Updated Vital Signs BP 138/86 (BP Location: Left Arm)   Pulse 63   Temp 98.2 F (36.8 C) (Oral)   Resp 18   SpO2 97%   Visual Acuity Right Eye Distance:   Left Eye Distance:   Bilateral Distance:    Right Eye Near:   Left Eye Near:    Bilateral Near:     Physical Exam Vitals reviewed.  Constitutional:      General: She is awake. She is not in acute distress.    Appearance: Normal appearance. She is well-developed. She is not ill-appearing.     Comments: Very pleasant female appears stated age in no acute distress sitting comfortably in exam room  HENT:     Head: Normocephalic and atraumatic.  Cardiovascular:     Rate and Rhythm: Normal  rate and regular rhythm.     Pulses:          Posterior tibial pulses are 1+ on the right side and 1+ on the left side.     Heart sounds: Normal heart sounds, S1 normal and S2 normal. No murmur heard. Pulmonary:     Effort: Pulmonary effort is normal.     Breath sounds: Normal breath sounds. No wheezing, rhonchi or rales.     Comments: Clear to auscultation bilaterally Abdominal:     Palpations: Abdomen is soft.     Tenderness: There is no abdominal tenderness.  Musculoskeletal:     Right foot: Normal capillary refill. Swelling and tenderness present. No bony tenderness.     Comments: Right foot/ankle: Swelling with associated erythema noted at right great MTP joint and medial malleolus of ankle.  Foot neurovascularly intact.  Normal active range of motion at phalanges and ankle.  Psychiatric:        Behavior: Behavior is cooperative.     UC Treatments / Results  Labs (all labs ordered are listed, but only abnormal results are displayed) Labs Reviewed - No data to display  EKG   Radiology No results found.  Procedures Procedures (including critical care time)  Medications Ordered in UC Medications - No data to display  Initial Impression / Assessment and Plan / UC Course  I have reviewed the triage vital signs and the nursing notes.  Pertinent labs & imaging results that were available during my care of the patient were reviewed by me and considered in my medical decision making (see chart for details).     Symptoms consistent with gout  Plain films were deferred given lack of recent trauma and history of gout with similar presentation.  Patient has failed steroids  and is unable to take high-dose NSAIDs due to chronic anticoagulation.  She was prescribed colchicine with instruction to hold atorvastatin for the time she was on culture seen and for 72 hours after completing course.  Recommended conservative treatment including elevation and rest.  Given recurrent episodes  recommended she follow-up with her primary care provider to consider allopurinol or other preventative medication.  Discussed dietary modifications that will help decrease the frequency of gout flares.  Discussed alarm symptoms that warrant emergent evaluation.  Strict return precautions given to which she expressed understanding.  Blood pressure was initially very elevated.  Patient denied any signs/symptoms of endorgan damage.  Recommended she monitor blood pressure at home and if this remains above 140/90 she is to follow-up with PCP.  She is to take antihypertensive medication as prescribed.  Recommended she avoid NSAIDs, caffeine, salt, decongestants.  If she develops any chest pain, shortness of breath, headache, dizziness in the setting of high blood pressure she needs to go to the emergency room.  Recheck of blood pressure was significantly improved.  Final Clinical Impressions(s) / UC Diagnoses   Final diagnoses:  Acute gout involving toe of right foot, unspecified cause  Great toe pain, right  Acute right ankle pain  Elevated blood pressure reading in office with diagnosis of hypertension     Discharge Instructions      I believe that you have a gout flare.  Please take colchicine as prescribed.  While you are taking your colchicine please do not take your atorvastatin (Lipitor) and you should wait 3 days after completing course of medication to restart this.  You can use Tylenol for pain.  Since he continue to have recurrent episodes I would recommend following up with your primary care provider to consider preventative medication such as allopurinol.  If you have any worsening symptoms including continued pain, swelling, difficulty walking, fever, nausea, vomiting you need to be seen again.  The medication for smoking cessation that we discussed is Wellbutrin.  Please discuss this further with your PCP.     ED Prescriptions     Medication Sig Dispense Auth. Provider    colchicine 0.6 MG tablet Take 2 tabs at onset of gout flare.  Take 1 tablet/day thereafter until symptoms resolve 12 tablet Francile Woolford K, PA-C      PDMP not reviewed this encounter.   Terrilee Croak, PA-C 07/24/21 1405

## 2021-07-24 NOTE — Discharge Instructions (Signed)
I believe that you have a gout flare.  Please take colchicine as prescribed.  While you are taking your colchicine please do not take your atorvastatin (Lipitor) and you should wait 3 days after completing course of medication to restart this.  You can use Tylenol for pain.  Since he continue to have recurrent episodes I would recommend following up with your primary care provider to consider preventative medication such as allopurinol.  If you have any worsening symptoms including continued pain, swelling, difficulty walking, fever, nausea, vomiting you need to be seen again.  The medication for smoking cessation that we discussed is Wellbutrin.  Please discuss this further with your PCP.

## 2021-07-24 NOTE — ED Triage Notes (Signed)
Pt states here a few weeks ago for gout and it never fully went away with meds. C/o rt foot and ankle swelling, pain, and redness increased in the past week.

## 2021-08-23 ENCOUNTER — Other Ambulatory Visit: Payer: Self-pay | Admitting: Family Medicine

## 2021-08-23 DIAGNOSIS — F419 Anxiety disorder, unspecified: Secondary | ICD-10-CM

## 2021-08-23 NOTE — Telephone Encounter (Signed)
Requested medication (s) are due for refill today: Yes  Requested medication (s) are on the active medication list: Yes  Last refill:  10/31/20  Future visit scheduled: No  Notes to clinic:  Is pt. Still seen at the practice?    Requested Prescriptions  Pending Prescriptions Disp Refills   sertraline (ZOLOFT) 100 MG tablet [Pharmacy Med Name: SERTRALINE HCL 100 MG TABLET] 30 tablet 0    Sig: TAKE 1 TABLET BY MOUTH EVERY DAY FOR ANXIETY AND DEPRESSION**NO FURTHER REFILLS UNTIL SEEN*     Psychiatry:  Antidepressants - SSRI Failed - 08/23/2021  8:47 AM      Failed - Completed PHQ-2 or PHQ-9 in the last 360 days      Failed - Valid encounter within last 6 months    Recent Outpatient Visits           1 year ago Acquired hypothyroidism   [facility] [place], [place], NP   1 year ago Pure hypercholesterolemia   [facility] [address] [facility] [place], [place], [place]   2 years ago Acquired hypothyroidism   [facility] [place], Odette Horns, MD   2 years ago Screening for colon cancer   [facility] [place], Odette Horns, MD   3 years ago Anxiety   [facility] [place], [place], [place]

## 2023-07-07 ENCOUNTER — Ambulatory Visit: Payer: Medicare HMO | Admitting: Cardiology

## 2023-07-07 DIAGNOSIS — I4892 Unspecified atrial flutter: Secondary | ICD-10-CM

## 2023-07-07 NOTE — Progress Notes (Unsigned)
Electrophysiology Office Note:   Date:  07/07/2023  ID:  Anita Russell, DOB 1956-01-11, MRN [id number]  Primary Cardiologist: None Electrophysiologist: Nobie Putnam, MD  {Click to update primary MD,subspecialty MD or APP then REFRESH:1}    History of Present Illness:   Anita Russell is a 67 y.o. female with h/o persistent atrial fibrillation status post cardioversion, atrial flutter, hypertension, hypothyroidism and EtOH abuse who is being seen today for evaluation of atrial fibrillation and flutter.  On 06/22/2021, she underwent a WACA and cavotricuspid isthmus radiofrequency ablation for empiric typical atrial flutter treatment.   Review of systems complete and found to be negative unless listed in HPI.   EP Information / Studies Reviewed:    {EKGtoday:28818}      Echo 07/26/20:  Left Ventricle: Systolic function is normal. EF: 60-65%.    Aortic Valve: Mild regurgitation.    Left Atrium: Left atrium is mildly dilated.    Left Ventricle: Doppler parameters indicate normal diastolic function.    Tricuspid Valve: The right ventricular systolic pressure is normal (<36  mmHg).   Aortic Valve: The aortic valve was not well visualized.    Aortic Valve: The aortic valve is tricuspid. The leaflets are mildly  thickened and exhibit moderately reduced excursion. The leaflets are  mildly calcified.  By TEE from October 2018 tricuspid wall was noted  tricuspid, moderately thickened and moderately calcified.    Aortic Valve: There is no evidence of aortic valve stenosis.  Mean  gradient 8 mmHg.  Peak gradient 16 mmHg.  Peak velocity 2 m/s.   Nuclear stress 07/27/2020: Normal cardiac perfusion.  No reversible or fixed perfusion defects.  3 times daily is normal.  LVEF is normal.  Risk Assessment/Calculations:    CHA2DS2-VASc Score = 4  {Click here to calculate score.  REFRESH note before signing. :1} This indicates a 4.8% annual risk of stroke. The patient's score is based  upon: CHF History: 0 HTN History: 1 Diabetes History: 0 Stroke History: 0 Vascular Disease History: 1 Age Score: 1 Gender Score: 1   {This patient has a significant risk of stroke if diagnosed with atrial fibrillation.  Please consider VKA or DOAC agent for anticoagulation if the bleeding risk is acceptable.   You can also use the SmartPhrase .[email or web address] for documentation.   :[id number]} No BP recorded.  {Refresh Note OR Click here to enter BP  :1}***        Physical Exam:   VS:  There were no vitals taken for this visit.   Wt Readings from Last 3 Encounters:  12/28/20 165 lb 12.6 oz (75.2 kg)  01/14/20 161 lb 3.2 oz (73.1 kg)  04/20/19 165 lb (74.8 kg)     GEN: Well nourished, well developed in no acute distress NECK: No JVD; No carotid bruits CARDIAC: {EPRHYTHM:28826}, no murmurs, rubs, gallops RESPIRATORY:  Clear to auscultation without rales, wheezing or rhonchi  ABDOMEN: Soft, non-tender, non-distended EXTREMITIES:  No edema; No deformity   ASSESSMENT AND PLAN:   Anita Russell is a 67 y.o. female with h/o persistent atrial fibrillation status post cardioversion, atrial flutter, hypertension, hypothyroidism and EtOH abuse who is being seen today for evaluation of atrial fibrillation and flutter.  #Persistent atrial fibrillation: PVI and CTI ablation on 06/22/2021.  There is reported history of atypical atrial flutter but this was not ablated.  She has previously been on amiodarone and sotalol. #Atypical atrial flutter  #Secondary hypercoagulable state due to atrial fibrillation: CHA2DS2-VASc score of 3-4, questionable history of PAD. Continue Eliquis  #  Hypertension *** goal today.  Recommend checking blood pressures 1-2 times per week at home and recording the values.  Recommend bringing these recordings to the primary care physician.    Follow up with {PIRJJ:88416} {EPFOLLOW SA:63016}  Signed, Nobie Putnam, MD

## 2023-07-10 NOTE — Progress Notes (Signed)
Visit cancelled due to lack of transportation.

## 2023-08-04 ENCOUNTER — Encounter: Payer: Self-pay | Admitting: Cardiology

## 2023-08-04 ENCOUNTER — Other Ambulatory Visit: Payer: Self-pay

## 2023-08-04 ENCOUNTER — Ambulatory Visit: Payer: Medicare HMO | Attending: Cardiology | Admitting: Cardiology

## 2023-08-04 VITALS — BP 122/90 | HR 128 | Ht 69.0 in | Wt 134.2 lb

## 2023-08-04 DIAGNOSIS — I4891 Unspecified atrial fibrillation: Secondary | ICD-10-CM | POA: Diagnosis not present

## 2023-08-04 DIAGNOSIS — I4892 Unspecified atrial flutter: Secondary | ICD-10-CM

## 2023-08-04 DIAGNOSIS — I471 Supraventricular tachycardia, unspecified: Secondary | ICD-10-CM

## 2023-08-04 MED ORDER — AMIODARONE HCL 200 MG PO TABS: ORAL_TABLET | ORAL | 0 refills | Status: DC

## 2023-08-04 NOTE — H&P (View-Only) (Signed)
 Electrophysiology Office Note:   Date:  08/04/2023  ID:  Anita Russell, DOB 1956-04-11, MRN [id number]  Primary Cardiologist: None Electrophysiologist: Nobie Putnam, MD      History of Present Illness:   Anita Russell is a 67 y.o. female with h/o persistent atrial fibrillation and atrial flutter s/p PVI and CTI ablation on 06/22/21, hypertension, hypothyroidism and EtOH abuse who is being seen today for evaluation of atrial flutter.   Patient was previously followed by [facility] for her atrial arrhythmias.  She underwent a PVI and CTI ablation in 05/2021.  It seems that she did have recurrence of atrial flutter rather quickly after procedure and there was some concern that her flutter was atypical in nature.  She was last seen at [facility] September 2024 and was then atrial flutter at that time.  She was continued on digoxin, metoprolol and Eliquis.  The patient thinks that she has been out of rhythm frequently over the past few years.  Today, she continues to report palpitations which again have been ongoing for years.  She also feels dizziness and lightheadedness, presyncopal symptoms at times.  She is otherwise able to be fairly active and denies chest pain, shortness of breath, or lower extremity edema.  Review of systems complete and found to be negative unless listed in HPI.   EP Information / Studies Reviewed:    EKG is ordered today. Personal review as below. Atrial flutter with 2-1 conduction.  Ventricular rate of 120 bpm.      EKG 01/14/21: Atrial flutter with 2:1 conduction.  06/22/21 Catheter Ablation ([facility]):  The pulmonary veins did not isolate with first pass ablation, and both  sides required mapping to find additional connections. We eventually  achieved isolation, and despite use of isuprel, no reconnection seen.  Despite isuprel and multiple attempts at induction, I was unable to induce  an atypical flutter. Decision to proceed with empiric cavotricuspid   isthmus radiofrequency ablation.   All lesions were applied at 40-50W between 5-15 seconds in duration. The  pentaray catheter was used to assess isolation of all pulmonary veins. A  figure of eight suture was placed at each accessed femoral area without  tying the knot. A stopcock was placed over the suture and locked to add  pressure on top of the venotomy sites. The area was assessed for  hemostasis and presence of pulse prior to patient leaving the room..   EP study summary:  1. Abnromal sinus node function with a cSNRT of  2. HV 41ms  3. 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  a. AH jump was present  b. AVB CL:  4. No arrhythmia could be induced with pacing maneuvers at the end of the  case.  5. S5 protocol was not performed  6. cardioversion was performed  7. TIT: in both directions post ablation. Confirmed with  differential pacing.   Assessment:  Wide antral circumferential ablation (WACA) of all pulmonary veins  cavotricuspid isthmus radiofrequency ablation for typical atrial flutter  Intracardiac echocardiogram for 3D anatomy, TSP x2.  Comprehensive EP study   Risk Assessment/Calculations:    CHA2DS2-VASc Score = 4   This indicates a 4.8% annual risk of stroke. The patient's score is based upon: CHF History: 0 HTN History: 1 Diabetes History: 0 Stroke History: 0 Vascular Disease History: 1 Age Score: 1 Gender Score: 1         Physical Exam:   VS:  BP (!) 122/90 (  BP Location: Right Arm, Patient Position: Sitting, Cuff Size: Normal)   Pulse (!) 128   Ht 5\' 9"  (1.753 m)   Wt 134 lb 3.2 oz (60.9 kg)   SpO2 95%   BMI 19.82 kg/m    Wt Readings from Last 3 Encounters:  08/04/23 134 lb 3.2 oz (60.9 kg)  12/28/20 165 lb 12.6 oz (75.2 kg)  01/14/20 161 lb 3.2 oz (73.1 kg)     GEN: Well nourished, well developed in no acute distress NECK: No JVD. CARDIAC: Tachycardic and regular. RESPIRATORY:  Clear to  auscultation without rales, wheezing or rhonchi  ABDOMEN: Soft, non-tender, non-distended EXTREMITIES:  No edema; No deformity   ASSESSMENT AND PLAN:   Anita Russell is a 67 y.o. female with h/o persistent atrial fibrillation and atrial flutter s/p PVI and CTI ablation on 06/22/21, hypertension, hypothyroidism and EtOH abuse who is being seen today for evaluation of atrial flutter.   #. Symptomatic atrial flutter: Typical in appearance based on EKG; however, prior EP notes from [facility] suspected atypical flutter in nature given prior CTI ablation. #. Persistent atrial fibrillation: No known recurrences of atrial fibrillation however she has been atrial flutter for some time. - Continue metoprolol, digoxin for now.  May need to be adjusted after cardioversion. - Start oral amiodarone as likely bridge to ablation.  We will recheck thyroid studies in the setting of starting amiodarone. - We will pursue cardioversion 1 week after starting oral amiodarone.  She denies any missed doses of Eliquis. - Update echocardiogram.  Last done in 2022 and she has spent quite some time in atrial flutter since. - We will tentatively schedule an ablation in approximately 2 months.  Hopefully she will maintain sinus rhythm on amiodarone following cardioversion.  I will plan to see her back in 4 to 6 weeks to assess for recurrence of atrial fibs or flutter and also to solidify plans for ablation.  #. Hypertension - At goal today.  Recommend checking blood pressures 1-2 times per week at home and recording the values.  Recommend bringing these recordings to the primary care physician.  #. Hypothyroidism: has had elevated free T4 in the past.  - Currently on Synthroid. - We will recheck thyroid labs given that she is starting amiodarone.  Follow up with Dr. Jimmey Ralph  4 to 6 weeks.    Total time of encounter: 67 minutes total time of encounter, including chart review, face-to-face patient care, coordination of  care and counseling regarding high complexity medical decision making.   Signed, Nobie Putnam, MD

## 2023-08-04 NOTE — Patient Instructions (Addendum)
Medication Instructions:  Your physician has recommended you make the following change in your medication:  1) START taking amiodarone 400 mg twice daily for 7 days, then 200 mg twice daily for 7 days, then 200 mg daily thereafter  *If you need a refill on your cardiac medications before your next appointment, please call your pharmacy*   Lab Work: TODAY: BMET, CBC, TSH, T4  Testing/Procedures: Echocardiogram Your physician has requested that you have an echocardiogram. Echocardiography is a painless test that uses sound waves to create images of your heart. It provides your doctor with information about the size and shape of your heart and how well your heart's chambers and valves are working. This procedure takes approximately one hour. There are no restrictions for this procedure. Please do NOT wear cologne, perfume, aftershave, or lotions (deodorant is allowed). Please arrive 15 minutes prior to your appointment time.  Please note: We ask at that you not bring children with you during ultrasound (echo/ vascular) testing. Due to room size and safety concerns, children are not allowed in the ultrasound rooms during exams. Our front office staff cannot provide observation of children in our lobby area while testing is being conducted. An adult accompanying a patient to their appointment will only be allowed in the ultrasound room at the discretion of the ultrasound technician under special circumstances. We apologize for any inconvenience.  Cardioversion: Your physician has recommended that you have a Cardioversion (DCCV). Electrical Cardioversion uses a jolt of electricity to your heart either through paddles or wired patches attached to your chest. This is a controlled, usually prescheduled, procedure. Defibrillation is done under light anesthesia in the hospital, and you usually go home the day of the procedure. This is done to get your heart back into a normal rhythm. You are not awake for  the procedure. Please see the instruction sheet given to you today.  Cardiac CT: Your physician has requested that you have cardiac CT. Cardiac computed tomography (CT) is a painless test that uses an x-ray machine to take clear, detailed pictures of your heart. For further information please visit [email or web address] We will call you to schedule your CT scan. It will be done about three weeks prior to your ablation.  Ablation: Your physician has recommended that you have an ablation. Catheter ablation is a medical procedure used to treat some cardiac arrhythmias (irregular heartbeats). During catheter ablation, a long, thin, flexible tube is put into a blood vessel in your groin (upper thigh), or neck. This tube is called an ablation catheter. It is then guided to your heart through the blood vessel. Radio frequency waves destroy small areas of heart tissue where abnormal heartbeats may cause an arrhythmia to start.  You are scheduled for  Atrial Flutter Ablation  on Friday, February 7 with Dr. Michele Rockers.Please arrive at the Main Entrance A at [facility]: [address] [place], [place] [address] at 8:30 AM     Follow-Up: At [facility], you and your health needs are our priority.  As part of our continuing mission to provide you with exceptional heart care, we have created designated Provider Care Teams.  These Care Teams include your primary Cardiologist (physician) and Advanced Practice Providers (APPs -  Physician Assistants and Nurse Practitioners) who all work together to provide you with the care you need, when you need it.  Your next appointment:   1/24 at 3:15pm  Provider:   Nobie Putnam, MD

## 2023-08-04 NOTE — Progress Notes (Signed)
Electrophysiology Office Note:   Date:  08/04/2023  ID:  Anita Russell, DOB 1956-04-11, MRN [id number]  Primary Cardiologist: None Electrophysiologist: Nobie Putnam, MD      History of Present Illness:   Anita Russell is a 67 y.o. female with h/o persistent atrial fibrillation and atrial flutter s/p PVI and CTI ablation on 06/22/21, hypertension, hypothyroidism and EtOH abuse who is being seen today for evaluation of atrial flutter.   Patient was previously followed by [facility] for her atrial arrhythmias.  She underwent a PVI and CTI ablation in 05/2021.  It seems that she did have recurrence of atrial flutter rather quickly after procedure and there was some concern that her flutter was atypical in nature.  She was last seen at [facility] in September 2024 and was then atrial flutter at that time.  She was continued on digoxin, metoprolol and Eliquis.  The patient thinks that she has been out of rhythm frequently over the past few years.  Today, she continues to report palpitations which again have been ongoing for years.  She also feels dizziness and lightheadedness, presyncopal symptoms at times.  She is otherwise able to be fairly active and denies chest pain, shortness of breath, or lower extremity edema.  Review of systems complete and found to be negative unless listed in HPI.   EP Information / Studies Reviewed:    EKG is ordered today. Personal review as below. Atrial flutter with 2-1 conduction.  Ventricular rate of 120 bpm.      EKG 01/14/21: Atrial flutter with 2:1 conduction.  06/22/21 Catheter Ablation ([facility]):  The pulmonary veins did not isolate with first pass ablation, and both  sides required mapping to find additional connections. We eventually  achieved isolation, and despite use of isuprel, no reconnection seen.  Despite isuprel and multiple attempts at induction, I was unable to induce  an atypical flutter. Decision to proceed with empiric cavotricuspid   isthmus radiofrequency ablation.   All lesions were applied at 40-50W between 5-15 seconds in duration. The  pentaray catheter was used to assess isolation of all pulmonary veins. A  figure of eight suture was placed at each accessed femoral area without  tying the knot. A stopcock was placed over the suture and locked to add  pressure on top of the venotomy sites. The area was assessed for  hemostasis and presence of pulse prior to patient leaving the room..   EP study summary:  1. Abnromal sinus node function with a cSNRT of  2. HV 41ms  3. 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  a. AH jump was present  b. AVB CL:  4. No arrhythmia could be induced with pacing maneuvers at the end of the  case.  5. S5 protocol was not performed  6. cardioversion was performed  7. TIT: in both directions post ablation. Confirmed with  differential pacing.   Assessment:  Wide antral circumferential ablation (WACA) of all pulmonary veins  cavotricuspid isthmus radiofrequency ablation for typical atrial flutter  Intracardiac echocardiogram for 3D anatomy, TSP x2.  Comprehensive EP study   Risk Assessment/Calculations:    CHA2DS2-VASc Score = 4   This indicates a 4.8% annual risk of stroke. The patient's score is based upon: CHF History: 0 HTN History: 1 Diabetes History: 0 Stroke History: 0 Vascular Disease History: 1 Age Score: 1 Gender Score: 1         Physical Exam:   VS:  BP (!) 122/90 (  BP Location: Right Arm, Patient Position: Sitting, Cuff Size: Normal)   Pulse (!) 128   Ht 5\' 9"  (1.753 m)   Wt 134 lb 3.2 oz (60.9 kg)   SpO2 95%   BMI 19.82 kg/m    Wt Readings from Last 3 Encounters:  08/04/23 134 lb 3.2 oz (60.9 kg)  12/28/20 165 lb 12.6 oz (75.2 kg)  01/14/20 161 lb 3.2 oz (73.1 kg)     GEN: Well nourished, well developed in no acute distress NECK: No JVD. CARDIAC: Tachycardic and regular. RESPIRATORY:  Clear to  auscultation without rales, wheezing or rhonchi  ABDOMEN: Soft, non-tender, non-distended EXTREMITIES:  No edema; No deformity   ASSESSMENT AND PLAN:   Anita Russell is a 67 y.o. female with h/o persistent atrial fibrillation and atrial flutter s/p PVI and CTI ablation on 06/22/21, hypertension, hypothyroidism and EtOH abuse who is being seen today for evaluation of atrial flutter.   #. Symptomatic atrial flutter: Typical in appearance based on EKG; however, prior EP notes from [facility] suspected atypical flutter in nature given prior CTI ablation. #. Persistent atrial fibrillation: No known recurrences of atrial fibrillation however she has been atrial flutter for some time. - Continue metoprolol, digoxin for now.  May need to be adjusted after cardioversion. - Start oral amiodarone as likely bridge to ablation.  We will recheck thyroid studies in the setting of starting amiodarone. - We will pursue cardioversion 1 week after starting oral amiodarone.  She denies any missed doses of Eliquis. - Update echocardiogram.  Last done in 2022 and she has spent quite some time in atrial flutter since. - We will tentatively schedule an ablation in approximately 2 months.  Hopefully she will maintain sinus rhythm on amiodarone following cardioversion.  I will plan to see her back in 4 to 6 weeks to assess for recurrence of atrial fibs or flutter and also to solidify plans for ablation.  #. Hypertension - At goal today.  Recommend checking blood pressures 1-2 times per week at home and recording the values.  Recommend bringing these recordings to the primary care physician.  #. Hypothyroidism: has had elevated free T4 in the past.  - Currently on Synthroid. - We will recheck thyroid labs given that she is starting amiodarone.  Follow up with Dr. Jimmey Ralph  4 to 6 weeks.    Total time of encounter: 67 minutes total time of encounter, including chart review, face-to-face patient care, coordination of  care and counseling regarding high complexity medical decision making.   Signed, Nobie Putnam, MD

## 2023-08-05 LAB — CBC
Hematocrit: 43.3 % (ref 34.0–46.6)
Hemoglobin: 14.9 g/dL (ref 11.1–15.9)
MCH: 34 pg — ABNORMAL HIGH (ref 26.6–33.0)
MCHC: 34.4 g/dL (ref 31.5–35.7)
MCV: 99 fL — ABNORMAL HIGH (ref 79–97)
Platelets: 175 10*3/uL (ref 150–450)
RBC: 4.38 x10E6/uL (ref 3.77–5.28)
RDW: 11.9 % (ref 11.7–15.4)
WBC: 6.2 10*3/uL (ref 3.4–10.8)

## 2023-08-05 LAB — BASIC METABOLIC PANEL
BUN/Creatinine Ratio: 12 (ref 12–28)
BUN: 12 mg/dL (ref 8–27)
CO2: 24 mmol/L (ref 20–29)
Calcium: 9.6 mg/dL (ref 8.7–10.3)
Chloride: 99 mmol/L (ref 96–106)
Creatinine, Ser: 0.98 mg/dL (ref 0.57–1.00)
Glucose: 83 mg/dL (ref 70–99)
Potassium: 4.9 mmol/L (ref 3.5–5.2)
Sodium: 140 mmol/L (ref 134–144)
eGFR: 63 mL/min/{1.73_m2} (ref >59)

## 2023-08-05 LAB — T4, FREE: Free T4: 1.11 ng/dL (ref 0.82–1.77)

## 2023-08-05 LAB — TSH: TSH: 1.8 u[IU]/mL (ref 0.450–4.500)

## 2023-08-12 NOTE — Progress Notes (Signed)
Attempted to call pt but voicemail is full and unable to leave a detailed message.

## 2023-08-13 ENCOUNTER — Ambulatory Visit (HOSPITAL_COMMUNITY)
Admission: RE | Admit: 2023-08-13 | Discharge: 2023-08-13 | Disposition: A | Discharge status: Home / Self Care | Payer: Medicare HMO | Attending: Cardiovascular Disease | Admitting: Cardiovascular Disease

## 2023-08-13 ENCOUNTER — Encounter (HOSPITAL_COMMUNITY): Payer: Self-pay | Admitting: Cardiovascular Disease

## 2023-08-13 ENCOUNTER — Other Ambulatory Visit: Payer: Self-pay

## 2023-08-13 ENCOUNTER — Ambulatory Visit (HOSPITAL_BASED_OUTPATIENT_CLINIC_OR_DEPARTMENT_OTHER): Payer: Medicare HMO | Admitting: Anesthesiology

## 2023-08-13 ENCOUNTER — Ambulatory Visit (HOSPITAL_COMMUNITY): Payer: Self-pay | Admitting: Anesthesiology

## 2023-08-13 ENCOUNTER — Encounter (HOSPITAL_COMMUNITY)
Admission: RE | Disposition: A | Discharge status: Home / Self Care | Payer: Self-pay | Source: Home / Self Care | Attending: Cardiovascular Disease

## 2023-08-13 DIAGNOSIS — Z79890 Hormone replacement therapy (postmenopausal): Secondary | ICD-10-CM | POA: Diagnosis not present

## 2023-08-13 DIAGNOSIS — I4892 Unspecified atrial flutter: Secondary | ICD-10-CM

## 2023-08-13 DIAGNOSIS — J449 Chronic obstructive pulmonary disease, unspecified: Secondary | ICD-10-CM | POA: Diagnosis not present

## 2023-08-13 DIAGNOSIS — I10 Essential (primary) hypertension: Secondary | ICD-10-CM | POA: Insufficient documentation

## 2023-08-13 DIAGNOSIS — Z79899 Other long term (current) drug therapy: Secondary | ICD-10-CM | POA: Diagnosis not present

## 2023-08-13 DIAGNOSIS — F1010 Alcohol abuse, uncomplicated: Secondary | ICD-10-CM | POA: Diagnosis not present

## 2023-08-13 DIAGNOSIS — E039 Hypothyroidism, unspecified: Secondary | ICD-10-CM | POA: Insufficient documentation

## 2023-08-13 DIAGNOSIS — I4819 Other persistent atrial fibrillation: Secondary | ICD-10-CM | POA: Insufficient documentation

## 2023-08-13 DIAGNOSIS — I483 Typical atrial flutter: Secondary | ICD-10-CM | POA: Insufficient documentation

## 2023-08-13 DIAGNOSIS — Z01818 Encounter for other preprocedural examination: Secondary | ICD-10-CM

## 2023-08-13 HISTORY — PX: CARDIOVERSION: EP1203

## 2023-08-13 SURGERY — CARDIOVERSION (CATH LAB)
Anesthesia: General

## 2023-08-13 MED ORDER — ATROPINE SULFATE 1 MG/10ML IJ SOSY
PREFILLED_SYRINGE | INTRAMUSCULAR | Status: DC | PRN
  Administered 2023-08-13 (×2): .2 mg via INTRAVENOUS

## 2023-08-13 MED ORDER — PROPOFOL 10 MG/ML IV BOLUS
INTRAVENOUS | Status: DC | PRN
  Administered 2023-08-13: 50 mg via INTRAVENOUS
  Administered 2023-08-13: 20 mg via INTRAVENOUS

## 2023-08-13 MED ORDER — EPHEDRINE SULFATE-NACL 50-0.9 MG/10ML-% IV SOSY
PREFILLED_SYRINGE | INTRAVENOUS | Status: DC | PRN
  Administered 2023-08-13: 5 mg via INTRAVENOUS

## 2023-08-13 MED ORDER — LIDOCAINE 2% (20 MG/ML) 5 ML SYRINGE
INTRAMUSCULAR | Status: DC | PRN
  Administered 2023-08-13: 50 mg via INTRAVENOUS

## 2023-08-13 MED ORDER — SODIUM CHLORIDE 0.9 % IV SOLN: INTRAVENOUS | Status: DC

## 2023-08-13 SURGICAL SUPPLY — 1 items: PAD DEFIB RADIO PHYSIO CONN (PAD) ×1 IMPLANT

## 2023-08-13 NOTE — CV Procedure (Signed)
[facility]: Anesthesia: Dr Jean Rosenthal Propofol On Rx eliquis no missed doses  DCC x 1 for atrial flutter at rate 78 150J biphasic  Converted to marked sinus bradycardia rate 32 bpm Given 5 mg Ephedrine Given 0.4 mg atropine  BP stable 100 mmHg systolic  ECG shows sinus bradycardia with no AV block  Will likely go home off digoxin and metroprolol and only low dose amiodarone 200 mg daily  [name] MD [facility]

## 2023-08-13 NOTE — Transfer of Care (Signed)
Immediate Anesthesia Transfer of Care Note  Patient: Anita Russell  Procedure(s) Performed: CARDIOVERSION  Patient Location: PACU and Cath Lab  Anesthesia Type:MAC  Level of Consciousness: awake, alert , oriented, and patient cooperative  Airway & Oxygen Therapy: Patient Spontanous Breathing and Patient connected to nasal cannula oxygen  Post-op Assessment: Report given to RN and Post -op Vital signs reviewed and stable  Post vital signs: Reviewed and stable  Last Vitals:  Vitals Value Taken Time  BP 101/66   Temp    Pulse 47   Resp 20   SpO2 100     Last Pain: There were no vitals filed for this visit.       Complications: No notable events documented.

## 2023-08-13 NOTE — Interval H&P Note (Signed)
History and Physical Interval Note:  08/13/2023 8:56 AM  Anita Russell  has presented today for surgery, with the diagnosis of A FLUTTER.  The various methods of treatment have been discussed with the patient and family. After consideration of risks, benefits and other options for treatment, the patient has consented to  Procedure(s): CARDIOVERSION (N/A) as a surgical intervention.  The patient's history has been reviewed, patient examined, no change in status, stable for surgery.  I have reviewed the patient's chart and labs.  Questions were answered to the patient's satisfaction.     [name]

## 2023-08-13 NOTE — Anesthesia Preprocedure Evaluation (Addendum)
Anesthesia Evaluation  Patient identified by MRN, date of birth, ID band Patient awake    Reviewed: Allergy & Precautions, NPO status , Patient's Chart, lab work & pertinent test results, reviewed documented beta blocker date and time   History of Anesthesia Complications Negative for: history of anesthetic complications  Airway Mallampati: II  TM Distance: >3 FB Neck ROM: Full    Dental  (+) Dental Advisory Given, Chipped   Pulmonary COPD,  COPD inhaler, Current SmokerPatient did not abstain from smoking.   breath sounds clear to auscultation       Cardiovascular hypertension, Pt. on medications and Pt. on home beta blockers (-) angina + dysrhythmias Atrial Fibrillation  Rhythm:Irregular Rate:Normal  '18 stress:  1. No reversible ischemia or infarction.  2. Normal left ventricular wall motion.  3. Left ventricular ejection fraction 73%  '18 ECHO: EF 50% to 55%.  -Wall motion was normal; there were no regional wall motion abnormalities.  - Aortic valve: trivial AI.  - Ascending aorta: The ascending aorta was mildly dilated.  - Mitral valve: mild MR.  - Left atrium: The atrium was mildly dilated. No evidence of    thrombus in the atrial cavity or appendage.  - Right atrium: The atrium was mildly dilated. No evidence of    thrombus in the atrial cavity or appendage.  - Atrial septum: No defect or patent foramen ovale was identified.      Neuro/Psych   Anxiety Depression    negative neurological ROS     GI/Hepatic negative GI ROS, Neg liver ROS,,,  Endo/Other  Hypothyroidism    Renal/GU negative Renal ROS     Musculoskeletal   Abdominal   Peds  Hematology eliquis   Anesthesia Other Findings   Reproductive/Obstetrics                             Anesthesia Physical Anesthesia Plan  ASA: 3  Anesthesia Plan: General   Post-op Pain Management: Minimal or no pain anticipated    Induction: Intravenous  PONV Risk Score and Plan: 3 and Treatment may vary due to age or medical condition  Airway Management Planned: Natural Airway and Nasal Cannula  Additional Equipment: None  Intra-op Plan:   Post-operative Plan:   Informed Consent: I have reviewed the patients History and Physical, chart, labs and discussed the procedure including the risks, benefits and alternatives for the proposed anesthesia with the patient or authorized representative who has indicated his/her understanding and acceptance.     Dental advisory given  Plan Discussed with: CRNA and Surgeon  Anesthesia Plan Comments:         Anesthesia Quick Evaluation

## 2023-08-13 NOTE — Anesthesia Postprocedure Evaluation (Signed)
Anesthesia Post Note  Patient: Anita Torner  Procedure(s) Performed: CARDIOVERSION     Patient location during evaluation: Cath Lab Anesthesia Type: General Level of consciousness: awake and alert, patient cooperative and oriented Pain management: pain level controlled Vital Signs Assessment: post-procedure vital signs reviewed and stable Respiratory status: nonlabored ventilation, spontaneous breathing and respiratory function stable Cardiovascular status: blood pressure returned to baseline and stable Postop Assessment: no apparent nausea or vomiting Anesthetic complications: no   No notable events documented.  Last Vitals:  Vitals:   08/13/23 1015 08/13/23 1020  BP: 137/86 99/64  Pulse: (!) 52 (!) 48  Resp:    Temp:    SpO2: 91%     Last Pain:  Vitals:   08/13/23 1009  TempSrc: Temporal  PainSc: 0-No pain                 Jarrett Chicoine,E. Brynnly Bonet

## 2023-09-03 ENCOUNTER — Telehealth: Payer: Self-pay

## 2023-09-03 DIAGNOSIS — I4892 Unspecified atrial flutter: Secondary | ICD-10-CM

## 2023-09-03 NOTE — Telephone Encounter (Signed)
 Went over CT, Lab and Ablation Instructions with pt. She is scheduled to come to [facility] office for an Echo on 1/10 and will get a copy of Instruction letters at that time. She will have labs done at [facility] on that day.   CT is scheduled on 1/15... She has a f/u with Dr. Kennyth on 1/24 and aware that we will go over Ablation details at that time.

## 2023-09-05 ENCOUNTER — Ambulatory Visit (HOSPITAL_COMMUNITY): Payer: Medicare HMO

## 2023-09-09 ENCOUNTER — Telehealth (HOSPITAL_COMMUNITY): Payer: Self-pay | Admitting: Emergency Medicine

## 2023-09-09 NOTE — Telephone Encounter (Signed)
 Unable to leave vm Rockwell Alexandria RN Navigator Cardiac Imaging [facility] [phone] Office  [phone] Cell

## 2023-09-10 ENCOUNTER — Ambulatory Visit (HOSPITAL_COMMUNITY): Admission: RE | Admit: 2023-09-10 | Payer: Medicare HMO | Source: Ambulatory Visit

## 2023-09-12 ENCOUNTER — Telehealth (HOSPITAL_COMMUNITY): Payer: Self-pay | Admitting: *Deleted

## 2023-09-12 NOTE — Telephone Encounter (Signed)
Reaching out to patient to offer assistance regarding upcoming cardiac imaging study; pt verbalizes understanding of appt date/time, parking situation and where to check in, pre-test NPO status, and verified current allergies; name and call back number provided for further questions should they arise  Larey Brick RN Navigator Cardiac Imaging [facility] [phone] office [phone] cell  Patient aware to arrive at 8:30AM.

## 2023-09-15 ENCOUNTER — Ambulatory Visit (HOSPITAL_COMMUNITY)
Admission: RE | Admit: 2023-09-15 | Discharge: 2023-09-15 | Disposition: A | Discharge status: Home / Self Care | Payer: Medicare HMO | Source: Ambulatory Visit | Attending: Cardiology | Admitting: Cardiology

## 2023-09-15 ENCOUNTER — Telehealth: Payer: Self-pay | Admitting: Internal Medicine

## 2023-09-15 DIAGNOSIS — I471 Supraventricular tachycardia, unspecified: Secondary | ICD-10-CM | POA: Insufficient documentation

## 2023-09-15 DIAGNOSIS — I4892 Unspecified atrial flutter: Secondary | ICD-10-CM | POA: Insufficient documentation

## 2023-09-15 DIAGNOSIS — I4891 Unspecified atrial fibrillation: Secondary | ICD-10-CM | POA: Insufficient documentation

## 2023-09-15 MED ORDER — IOHEXOL 350 MG/ML SOLN
75.0000 mL | Freq: Once | INTRAVENOUS | Status: AC | PRN
  Administered 2023-09-15: 75 mL via INTRAVENOUS

## 2023-09-15 NOTE — Telephone Encounter (Addendum)
Patient presented for pulmonary vein CT study today prior to scheduled ablation - called by nursing staff (As the CT reader) for HR in 30-40's, BP in the 90's, O2 sats 99% (although described as having some acrocyanosis). Recent DCCV, but after cardioversion had marked bradycardia - he digoxin and metoprolol was stopped by Dr. Eden Emms and she was continued on amiodarone.  She cannot recall accurately, but she was recently told by her PCP to resume metoprolol and she endorses taking metoprolol, digoxin and amiodarone today.  I think she can safely complete the study, especially since there is no nitroglycerin. I would advise that she stop the metoprolol and digoxin and continue amiodarone as recommended. I'm not sure if they were restarted due to fast rates (which I presume).  Will notify Dr. Jimmey Ralph her EP.  D/w IR nurse. She was provided ER evaluation recommendations if she becomes symptomatic.  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 Echo correlation is advised.

## 2023-09-19 ENCOUNTER — Telehealth: Payer: Self-pay

## 2023-09-19 ENCOUNTER — Ambulatory Visit: Payer: Medicare HMO | Admitting: Cardiology

## 2023-09-19 MED ORDER — DABIGATRAN ETEXILATE MESYLATE 150 MG PO CAPS: 150.0000 mg | ORAL_CAPSULE | Freq: Two times a day (BID) | ORAL | 3 refills | Status: DC

## 2023-09-19 NOTE — Telephone Encounter (Signed)
-----   Message from Nobie Putnam sent at 09/17/2023  4:40 PM EST ----- Can we switch patient to Pradaxa given concern for thrombus on CT scan? I will follow uu on this change on Friday in clinic. ----- Message ----- From: Interface, Rad Results In Sent: 09/15/2023   1:52 PM EST To: Nobie Putnam, MD

## 2023-09-19 NOTE — Telephone Encounter (Signed)
Spoke with the patient and advised on recommendations from Dr. Jimmey Ralph. Prescription has been sent in.

## 2023-09-25 ENCOUNTER — Ambulatory Visit: Payer: Medicare HMO | Attending: Cardiology | Admitting: Cardiology

## 2023-09-25 VITALS — BP 118/60 | HR 58 | Ht 69.0 in | Wt 136.6 lb

## 2023-09-25 DIAGNOSIS — I4892 Unspecified atrial flutter: Secondary | ICD-10-CM | POA: Diagnosis not present

## 2023-09-25 NOTE — Patient Instructions (Signed)
Medication Instructions:  Your physician recommends that you continue on your current medications as directed. Please refer to the Current Medication list given to you today.  *If you need a refill on your cardiac medications before your next appointment, please call your pharmacy   Follow-Up: At [facility], you and your health needs are our priority.  As part of our continuing mission to provide you with exceptional heart care, we have created designated Provider Care Teams.  These Care Teams include your primary Cardiologist (physician) and Advanced Practice Providers (APPs -  Physician Assistants and Nurse Practitioners) who all work together to provide you with the care you need, when you need it.  Your next appointment:   As scheduled

## 2023-09-25 NOTE — Progress Notes (Signed)
Electrophysiology Office Note:   Date:  09/25/2023  ID:  Anita Russell, DOB 02-05-1956, MRN [id number]  Primary Cardiologist: None Electrophysiologist: Nobie Putnam, MD      History of Present Illness:   Anita Russell is a 68 y.o. female with h/o persistent atrial fibrillation and atrial flutter s/p PVI and CTI ablation on 06/22/21, hypertension, hypothyroidism and EtOH abuse who is being seen for evaluation of atrial flutter. Patient was previously followed by [facility] for her atrial arrhythmias.  She underwent a PVI and CTI ablation in 05/2021.  It seems that she did have recurrence of atrial flutter rather quickly after procedure and there was some concern that her flutter was atypical in nature.    Discussed the use of AI scribe software for clinical note transcription with the patient, who gave verbal consent to proceed.  History of Present Illness   The patient, with a history of atrial flutter, presents for a follow-up after a recent cardioversion. They report feeling 'a little wobbly every once in a while,' but overall, they feel better and have noticed less chest tightness since returning to normal rhythm. They describe their heart rhythm as more consistent, without the previous 'erratic skipping around.' They have resumed walking for exercise after a weather-related hiatus and report that their 'anxiety is my worst enemy.' They also attribute feeling better after a significant reduction in smoking.  She is now taking Pradaxa due to concern for thrombus on her pre-ablation CT chest.      Review of systems complete and found to be negative unless listed in HPI.   EP Information / Studies Reviewed:    EKG is ordered today. Personal review as below. Sinus bradycardia.  EKG Interpretation Date/Time:  Thursday September 25 2023 15:40:33 EST Ventricular Rate:  54 PR Interval:  192 QRS Duration:  80 QT Interval:  466 QTC Calculation: 441 R Axis:   105  Text  Interpretation: Sinus bradycardia Rightward axis Nonspecific ST abnormality similar to prior. When compared with ECG of 13-Aug-2023 10:02, QRS axis shifted right. Confirmed by Nobie Putnam [id number]) on 09/25/2023 4:28:26 PM   EKG 01/14/21: Atrial flutter with 2:1 conduction.  06/22/21 Catheter Ablation ([facility]):  The pulmonary veins did not isolate with first pass ablation, and both  sides required mapping to find additional connections. We eventually  achieved isolation, and despite use of isuprel, no reconnection seen.  Despite isuprel and multiple attempts at induction, I was unable to induce  an atypical flutter. Decision to proceed with empiric cavotricuspid  isthmus radiofrequency ablation.   All lesions were applied at 40-50W between 5-15 seconds in duration. The  pentaray catheter was used to assess isolation of all pulmonary veins. A  figure of eight suture was placed at each accessed femoral area without  tying the knot. A stopcock was placed over the suture and locked to add  pressure on top of the venotomy sites. The area was assessed for  hemostasis and presence of pulse prior to patient leaving the room..   EP study summary:  1. Abnromal sinus node function with a cSNRT of  2. HV 41ms  3. 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  a. AH jump was present  b. AVB CL:  4. No arrhythmia could be induced with pacing maneuvers at the end of the  case.  5. S5 protocol was not performed  6. cardioversion was performed  7. TIT: in both directions post ablation. Confirmed with  differential  pacing.   Assessment:  Wide antral circumferential ablation (WACA) of all pulmonary veins  cavotricuspid isthmus radiofrequency ablation for typical atrial flutter  Intracardiac echocardiogram for 3D anatomy, TSP x2.  Comprehensive EP study   Risk Assessment/Calculations:    CHA2DS2-VASc Score = 4   This indicates a 4.8% annual risk of  stroke. The patient's score is based upon: CHF History: 0 HTN History: 1 Diabetes History: 0 Stroke History: 0 Vascular Disease History: 1 Age Score: 1 Gender Score: 1         Physical Exam:   VS:  BP 118/60   Pulse (!) 58   Ht 5\' 9"  (1.753 m)   Wt 136 lb 9.6 oz (62 kg)   SpO2 98%   BMI 20.17 kg/m    Wt Readings from Last 3 Encounters:  09/25/23 136 lb 9.6 oz (62 kg)  08/04/23 134 lb 3.2 oz (60.9 kg)  12/28/20 165 lb 12.6 oz (75.2 kg)     GEN: Well nourished, well developed in no acute distress NECK: No JVD. CARDIAC: Bradycardic, regular RESPIRATORY:  Clear to auscultation without rales, wheezing or rhonchi  ABDOMEN: Soft, non-distended EXTREMITIES:  No edema; No deformity   ASSESSMENT AND PLAN:   Cheyrl Russell is a 68 y.o. female with h/o persistent atrial fibrillation and atrial flutter s/p PVI and CTI ablation on 06/22/21, hypertension, hypothyroidism and EtOH abuse who is being seen today for evaluation of atrial flutter.   #. Symptomatic atrial flutter: Typical in appearance based on EKG; however, prior EP notes from [facility] suspected atypical flutter in nature given prior CTI ablation. #. Persistent atrial fibrillation: No known recurrences of atrial fibrillation however she has been atrial flutter for some time. - Continue amiodarone. Stop digoxin.   - Update echocardiogram.  Scheduled for tomorrow.  - She is planned for ablation. Given presence of LAA thrombus on CT will need to delay 3-4 weeks after starting Pradaxa.   #. 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 Had been on Eliquis. - Changed to Pradaxa. Continue for now. We will perform TEE on day of ablation.  #. Hypertension - At goal today.  Recommend checking blood pressures 1-2 times per week at home and recording the values.  Recommend bringing these recordings to the primary care physician.   Signed, Nobie Putnam, MD

## 2023-09-26 ENCOUNTER — Telehealth: Payer: Self-pay

## 2023-09-26 ENCOUNTER — Ambulatory Visit (HOSPITAL_COMMUNITY): Payer: Medicare HMO | Attending: Cardiology

## 2023-09-26 DIAGNOSIS — I4891 Unspecified atrial fibrillation: Secondary | ICD-10-CM | POA: Insufficient documentation

## 2023-09-26 DIAGNOSIS — I471 Supraventricular tachycardia, unspecified: Secondary | ICD-10-CM | POA: Diagnosis present

## 2023-09-26 DIAGNOSIS — I4892 Unspecified atrial flutter: Secondary | ICD-10-CM | POA: Diagnosis present

## 2023-09-26 LAB — ECHOCARDIOGRAM COMPLETE
AR max vel: 0.79 cm2
AV Area VTI: 0.83 cm2
AV Area mean vel: 0.76 cm2
AV Mean grad: 21 mm[Hg]
AV Peak grad: 39.9 mm[Hg]
Ao pk vel: 3.16 m/s
Area-P 1/2: 3.81 cm2
MV M vel: 4.23 m/s
MV Peak grad: 71.6 mm[Hg]
S' Lateral: 2 cm

## 2023-09-26 LAB — CBC
Hematocrit: 41.6 % (ref 34.0–46.6)
Hemoglobin: 14.6 g/dL (ref 11.1–15.9)
MCH: 35.2 pg — ABNORMAL HIGH (ref 26.6–33.0)
MCHC: 35.1 g/dL (ref 31.5–35.7)
MCV: 100 fL — ABNORMAL HIGH (ref 79–97)
Platelets: 182 10*3/uL (ref 150–450)
RBC: 4.15 x10E6/uL (ref 3.77–5.28)
RDW: 11.9 % (ref 11.7–15.4)
WBC: 6.6 10*3/uL (ref 3.4–10.8)

## 2023-09-26 LAB — BASIC METABOLIC PANEL
BUN/Creatinine Ratio: 19 (ref 12–28)
BUN: 20 mg/dL (ref 8–27)
CO2: 25 mmol/L (ref 20–29)
Calcium: 9.8 mg/dL (ref 8.7–10.3)
Chloride: 99 mmol/L (ref 96–106)
Creatinine, Ser: 1.07 mg/dL — ABNORMAL HIGH (ref 0.57–1.00)
Glucose: 96 mg/dL (ref 70–99)
Potassium: 4.6 mmol/L (ref 3.5–5.2)
Sodium: 142 mmol/L (ref 134–144)
eGFR: 57 mL/min/{1.73_m2} — ABNORMAL LOW (ref >59)

## 2023-09-26 NOTE — Telephone Encounter (Signed)
-----   Message from Nobie Putnam sent at 09/25/2023 10:05 PM EST ----- Regarding: Digoxin and ablation date Went back and looked at date of her CT which showed LAA thrombus. Ideally we need 4 weeks after changing to Pradaxa before repeating imaging, which won't be enough time for her procedure next week. Can we make her ablation the last week of Feb or first week of March instead? We can swap someone from that week to her spot if needed. Also, I stopped digoxin in her med list but when you call to reschedule can you inform her to no longer take digoxin. Sorry for any confusion.  Thanks, [employer]

## 2023-09-26 NOTE — Telephone Encounter (Signed)
Spoke with the patient and rescheduled her ablation to 2/27.

## 2023-09-30 ENCOUNTER — Telehealth: Payer: Self-pay

## 2023-09-30 NOTE — Telephone Encounter (Signed)
Attempted to call patient. Unable to leave voicemail.  

## 2023-09-30 NOTE — Telephone Encounter (Signed)
-----   Message from Fonda Kitty sent at 09/27/2023  8:40 PM EST ----- We need to pause on ablation altogether. No need to reschedule 4 weeks from CT due to LAA clot. She needs to be evaluated by general cardiology for possible severe AS. We can keep her on amiodarone  until management for aortic stenosis is determined. ----- Message ----- From: Interface, [employer] Sent: 09/26/2023   4:21 PM EST To: Fonda Kitty, MD

## 2023-10-07 NOTE — Telephone Encounter (Signed)
Attempted to call patient. Unable to leave voicemail.

## 2023-10-23 ENCOUNTER — Telehealth: Payer: Self-pay

## 2023-10-23 ENCOUNTER — Encounter (HOSPITAL_COMMUNITY): Admission: RE | Payer: Medicare HMO | Source: Home / Self Care

## 2023-10-23 ENCOUNTER — Ambulatory Visit (HOSPITAL_COMMUNITY): Admission: RE | Admit: 2023-10-23 | Payer: Medicare HMO | Source: Home / Self Care | Admitting: Cardiology

## 2023-10-23 DIAGNOSIS — I350 Nonrheumatic aortic (valve) stenosis: Secondary | ICD-10-CM

## 2023-10-23 SURGERY — A-FLUTTER ABLATION
Anesthesia: General

## 2023-10-23 NOTE — Telephone Encounter (Signed)
 Pt's A-flutter ablation cancelled due to aortic valve issues. Per Dr Excell Seltzer, she should be seen by someone on structural team for evaluation of A.S. Referral placed at this time and pt scheduled for 10/24/23 @8 :40 w/[place]

## 2023-10-24 ENCOUNTER — Ambulatory Visit (INDEPENDENT_AMBULATORY_CARE_PROVIDER_SITE_OTHER): Payer: Medicare HMO | Admitting: Cardiovascular Disease

## 2023-10-24 DIAGNOSIS — I4892 Unspecified atrial flutter: Secondary | ICD-10-CM

## 2023-10-24 NOTE — Progress Notes (Deleted)
 Structural Heart Clinic Consult Note  No chief complaint on file.  History of Present Illness: 68 yo female with history of persistent atrial fibrillation and atrial flutter s/p ablation, HTN, hypothyroidism, alcohol abuse and aortic stenosis who is here today as a new consult, referred by Dr. Jimmey Ralph, for further evaluation of her aortic stenosis. She has been followed in [facility] cardiology for atrial arrhythmias but is now followed in our EP clinic by Dr. Jimmey Ralph. She had atrial flutter ablation at [facility] in October 2022. She had recurrent atrial flutter and underwent cardioversion on 08/13/23. She was on Eliquis at the time of cardioversion. She followed with Dr. Jimmey Ralph and he discussed repeat ablation. Pre ablation cardiac CT with evidence of LAA thrombus so Eliquis was changed to Pradaxa. CT also suggested aortic stenosis. Echo 09/26/23 with LVEF=60-65%. Normal RV function. Paradoxical low flow/low gradient severe aortic stenosis with heavily calcified and thickened aortic valve leaflets with restricted leaflet excursion, mean gradient 21 mmHg, AVA 0.76 cm2, DI 0.26, SVI 32. Valve calcium score of 957 on CT. Her ablation was cancelled because of her severe AS. EKG on 09/26/23 with sinus bradycardia.   She tells me today that she She lives in *** She is retired Engineer, manufacturing ***  Primary Care Physician: Patient, No Pcp Per Primary Cardiologist: *** Referring Cardiologist: ***  Past Medical History:  Diagnosis Date   Anxiety    Asthma    Depression    Gout    Hypertension    Hypothyroidism    Mental disorder    Thyroid disease     Past Surgical History:  Procedure Laterality Date   ABDOMINAL HYSTERECTOMY     APPENDECTOMY     CARDIOVERSION N/A 06/16/2017   Procedure: CARDIOVERSION;  Surgeon: Orpah Cobb, MD;  Location: [facility] ENDOSCOPY;  Service: Cardiovascular;  Laterality: N/A;   CARDIOVERSION N/A 08/13/2023   Procedure: CARDIOVERSION;  Surgeon: Wendall Stade, MD;  Location: [facility]  INVASIVE CV LAB;  Service: Cardiovascular;  Laterality: N/A;   TEE WITHOUT CARDIOVERSION N/A 06/16/2017   Procedure: TRANSESOPHAGEAL ECHOCARDIOGRAM (TEE);  Surgeon: Orpah Cobb, MD;  Location: [facility] ENDOSCOPY;  Service: Cardiovascular;  Laterality: N/A;    Current Outpatient Medications  Medication Sig Dispense Refill   acetaminophen (TYLENOL) 500 MG tablet Take 500 mg by mouth every 6 (six) hours as needed for moderate pain.     albuterol (VENTOLIN HFA) 108 (90 Base) MCG/ACT inhaler Inhale 2 puffs into the lungs every 6 (six) hours as needed for wheezing or shortness of breath. (Patient taking differently: Inhale 1 puff into the lungs every 6 (six) hours as needed for wheezing or shortness of breath.) 8 g 2   amiodarone (PACERONE) 200 MG tablet Take 2 tablets (400 mg total) by mouth 2 (two) times daily for 7 days, THEN 1 tablet (200 mg total) 2 (two) times daily for 7 days, THEN 1 tablet (200 mg total) daily. 130 tablet 0   atorvastatin (LIPITOR) 20 MG tablet TAKE 1 TABLET BY MOUTH EVERY DAY. Must have office visit for refills 30 tablet 0   busPIRone (BUSPAR) 10 MG tablet Take 10 mg by mouth 3 (three) times daily.     clonazePAM (KLONOPIN) 0.5 MG tablet Take 0.5 mg by mouth 2 (two) times daily as needed for anxiety.     dabigatran (PRADAXA) 150 MG CAPS capsule Take 1 capsule (150 mg total) by mouth 2 (two) times daily. 180 capsule 3   levothyroxine (SYNTHROID) 50 MCG tablet Take 50 mcg by mouth daily  before breakfast.     loperamide (IMODIUM A-D) 2 MG tablet Take 4 mg by mouth as needed for diarrhea or loose stools. (Patient not taking: Reported on 09/25/2023)     mirtazapine (REMERON) 15 MG tablet Take 15 mg by mouth at bedtime.     Vitamin D, Ergocalciferol, (DRISDOL) 1.25 MG (50000 UNIT) CAPS capsule Take 50,000 Units by mouth every Monday. (Patient not taking: Reported on 09/25/2023)     No current facility-administered medications for this visit.    Allergies  Allergen Reactions   Hyzaar  [Losartan Potassium-Hctz] Other (See Comments)    Caused patient to feel light-headed and gave her vertigo   Meloxicam Rash    A "very bad" rash    Social History   Socioeconomic History   Marital status: Single    Spouse name: Not on file   Number of children: Not on file   Years of education: Not on file   Highest education level: Not on file  Occupational History   Not on file  Tobacco Use   Smoking status: Former    Current packs/day: 1.00    Types: Cigarettes   Smokeless tobacco: Never  Vaping Use   Vaping status: Never Used  Substance and Sexual Activity   Alcohol use: Not Currently    Alcohol/week: 4.0 - 7.0 standard drinks of alcohol    Types: 1 - 3 Glasses of wine, 3 - 4 Cans of beer per week    Comment: 3 times weekly   Drug use: Yes    Types: Marijuana   Sexual activity: Not Currently    Birth control/protection: Surgical  Other Topics Concern   Not on file  Social History Narrative   Not on file   Social Drivers of Health   Financial Resource Strain: Low Risk  (12/06/2022)   Received from [facility], [facility]   Overall Financial Resource Strain (CARDIA)    Difficulty of Paying Living Expenses: Not hard at all  Food Insecurity: No Food Insecurity (12/06/2022)   Received from [facility], [facility]   Hunger Vital Sign    Worried About Running Out of Food in the Last Year: Never true    Ran Out of Food in the Last Year: Never true  Transportation Needs: No Transportation Needs (12/06/2022)   Received from [facility], [facility]   PRAPARE - Transportation    Lack of Transportation (Medical): No    Lack of Transportation (Non-Medical): No  Physical Activity: Unknown (12/06/2022)   Received from [facility], [facility]   Exercise Vital Sign    Days of Exercise per Week: 0 days    Minutes of Exercise per Session: Not on file  Stress: No Stress Concern Present (04/07/2023)   Received from [facility] of  Occupational Health - Occupational Stress Questionnaire    Feeling of Stress : Not at all  Social Connections: Socially Isolated (12/06/2022)   Received from [facility], [facility]   Social Network    How would you rate your social network (family, work, friends)?: Little participation, lonely and socially isolated  Intimate Partner Violence: Not At Risk (04/07/2023)   Received from [facility]   HITS    Over the last 12 months how often did your partner physically hurt you?: Never    Over the last 12 months how often did your partner insult you or talk down to you?: Never    Over the last 12 months how often did  your partner threaten you with physical harm?: Never    Over the last 12 months how often did your partner scream or curse at you?: Never    Family History  Problem Relation Age of Onset   Hypertension Mother    Arthritis Father    Hypertension Father    Gout Father     Review of Systems:  As stated in the HPI and otherwise negative.   There were no vitals taken for this visit.  Physical Examination: General: Well developed, well nourished, NAD  HEENT: OP clear, mucus membranes moist  SKIN: warm, dry. No rashes. Neuro: No focal deficits  Musculoskeletal: Muscle strength 5/5 all ext  Psychiatric: Mood and affect normal  Neck: No JVD, no carotid bruits, no thyromegaly, no lymphadenopathy.  Lungs:Clear bilaterally, no wheezes, rhonci, crackles Cardiovascular: Regular rate and rhythm. *** Loud, harsh, late peaking systolic murmur.  Abdomen:Soft. Bowel sounds present. Non-tender.  Extremities: *** No lower extremity edema. Pulses are 2 + in the bilateral DP/PT.  EKG:  EKG {ACTION; IS/IS [id number]} ordered today. The ekg ordered today demonstrates ***  Echo 09/26/23:   1. Left ventricular ejection fraction, by estimation, is 60 to 65%. The  left ventricle has normal function. The left ventricle has no regional  wall motion abnormalities. Left ventricular  diastolic function could not  be evaluated.   2. Right ventricular systolic function is normal. The right ventricular  size is normal.   3. Left atrial size was moderately dilated.   4. Right atrial size was mild to moderately dilated.   5. The mitral valve is normal in structure. Trivial mitral valve  regurgitation. No evidence of mitral stenosis.   6. The aortic valve is severely calcified with fusion and restricted  excursion of valve cusps. Visually, the valve appears severely stenotic.  The mean transvalvular gradient is with an AVA by VTI of 0.83cm2  and DVI=0.26 consistent with moderate  AS. Can consider further doppler evaluation.   7. Aortic dilatation noted. 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   8. The inferior vena cava is normal in size with greater than 50%  respiratory variability, suggesting right atrial pressure of 3 mmHg.   FINDINGS   Left Ventricle: Left ventricular ejection fraction, by estimation, is 60  to 65%. The left ventricle has normal function. The left ventricle has no  regional wall motion abnormalities. The left ventricular internal cavity  size was normal in size. There is   no left ventricular hypertrophy. Left ventricular diastolic function  could not be evaluated due to atrial fibrillation. Left ventricular  diastolic function could not be evaluated.   Right Ventricle: The right ventricular size is normal. No increase in  right ventricular wall thickness. Right ventricular systolic function is  normal.   Left Atrium: Left atrial size was moderately dilated.   Right Atrium: Right atrial size was mild to moderately dilated.   Pericardium: There is no evidence of pericardial effusion.   Mitral Valve: The mitral valve is normal in structure. Trivial mitral  valve regurgitation. No evidence of mitral valve stenosis.   Tricuspid Valve: The tricuspid valve is normal in structure. Tricuspid  valve regurgitation  is mild . No evidence of tricuspid stenosis.   Aortic Valve: The aortic valve is calcified. There is severe calcifcation  of the aortic valve. There is severe thickening of the aortic valve. There  is moderate to severe aortic valve annular calcification. Aortic valve  regurgitation is  trivial. Moderate   aortic stenosis is present. Aortic valve mean gradient measures 21.0  mmHg. Aortic valve peak gradient measures 39.9 mmHg. Aortic valve area, by  VTI measures 0.83 cm.   Pulmonic Valve: The pulmonic valve was normal in structure. Pulmonic valve  regurgitation is trivial. No evidence of pulmonic stenosis.   Aorta: The aortic root is normal in size and structure and aortic  dilatation noted. There is severe dilatation of the ascending aorta,  measuring 47 mm.   Venous: The inferior vena cava is normal in size with greater than 50%  respiratory variability, suggesting right atrial pressure of 3 mmHg.   IAS/Shunts: No atrial level shunt detected by color flow Doppler.     LEFT VENTRICLE  PLAX 2D  LVIDd:         3.40 cm   Diastology  LVIDs:         2.00 cm   LV e' lateral: 9.03 cm/s  LV PW:         1.10 cm  LV IVS:        1.10 cm   2D Longitudinal Strain  LVOT diam:     2.00 cm   2D Strain GLS Avg:     22.3 %  LV SV:         56  LV SV Index:   32  LVOT Area:     3.14 cm                           3D Volume EF:                           3D EF:        58 %                           LV EDV:       117 ml                           LV ESV:       49 ml                           LV SV:        68 ml   RIGHT VENTRICLE  RV Basal diam:  3.80 cm  RV S prime:     21.30 cm/s  TAPSE (M-mode): 1.8 cm  RVSP:           45.2 mmHg   LEFT ATRIUM             Index        RIGHT ATRIUM           Index  LA diam:        4.20 cm 2.39 cm/m   RA Pressure: 3.00 mmHg  LA Vol (A2C):   53.9 ml 30.68 ml/m  RA Area:     19.50 cm  LA Vol (A4C):   80.5 ml 45.82 ml/m  RA Volume:   58.20 ml  33.13 ml/m   LA Biplane Vol: 67.3 ml 38.31 ml/m   AORTIC VALVE                     PULMONIC VALVE  AV Area (Vmax):    0.79 cm  PR End Diast Vel: 7.84 msec  AV Area (Vmean):   0.76 cm  AV Area (VTI):     0.83 cm  AV Vmax:           316.00 cm/s  AV Vmean:          210.000 cm/s  AV VTI:            0.671 m  AV Peak Grad:      39.9 mmHg  AV Mean Grad:      21.0 mmHg  LVOT Vmax:         79.00 cm/s  LVOT Vmean:        51.000 cm/s  LVOT VTI:          0.177 m  LVOT/AV VTI ratio: 0.26    AORTA  Ao Root diam: 3.20 cm  Ao Asc diam:  4.70 cm   MITRAL VALVE              TRICUSPID VALVE  MV Area (PHT): 3.81 cm   TR Peak grad:   42.2 mmHg  MV Decel Time: 199 msec   TR Vmax:        325.00 cm/s  MR Peak grad: 71.6 mmHg   Estimated RAP:  3.00 mmHg  MR Mean grad: 35.0 mmHg   RVSP:           45.2 mmHg  MR Vmax:      423.00 cm/s  MR Vmean:     248.0 cm/s  SHUNTS                            Systemic VTI:  0.18 m                            Systemic Diam: 2.00 cm   Recent Labs: 08/04/2023: TSH 1.800 09/25/2023: BUN 20; Creatinine, Ser 1.07; Hemoglobin 14.6; Platelets 182; Potassium 4.6; Sodium 142    Wt Readings from Last 3 Encounters:  09/25/23 62 kg  08/04/23 60.9 kg  12/28/20 75.2 kg    Assessment and Plan:   1. Severe Aortic Valve Stenosis: She has paradoxical low flow/low gradient severe aortic valve stenosis. She has NYHA class *** symptoms. I have personally reviewed the echo images. The aortic valve is thickened and calcified with limited leaflet mobility. I think she would benefit from AVR. Given advanced age, *** is not a good candidate for conventional AVR by surgical approach. I think *** may be a good candidate for TAVR.   I have reviewed the natural history of aortic stenosis with the patient and their family members  who are present today. We have discussed the limitations of medical therapy and the poor prognosis associated with symptomatic aortic stenosis. We have reviewed potential  treatment options, including palliative medical therapy, conventional surgical aortic valve replacement, and transcatheter aortic valve replacement. We discussed treatment options in the context of the patient's specific comorbid medical conditions.    *** would like to proceed with planning for TAVR. I will arrange a right and left heart catheterization at [facility] ***. Risks and benefits of the cath procedure and the valve procedure are reviewed with the patient. After the cath, *** will have a cardiac CT, CTA of the chest/abdomen and pelvis and will then be referred to see one of the CT surgeons on our TAVR team.     Labs/ tests ordered today include:  No orders of the defined types were placed in this encounter.    Disposition:   F/U will be arranged with the structural team  Signed, Verne Carrow, MD, [facility] 10/24/2023 6:51 AM    [facility] [address] [place], [place], [place]  [address] Phone: [phone]; Fax: [phone]

## 2023-10-26 NOTE — Progress Notes (Signed)
 Pt reschedule this visit

## 2023-10-29 ENCOUNTER — Encounter: Payer: Self-pay | Admitting: Cardiovascular Disease

## 2023-10-29 NOTE — Progress Notes (Unsigned)
 [facility] Consult Note  No chief complaint on file.  History of Present Illness: 68 yo female with history of persistent atrial fibrillation and atrial flutter s/p ablation in October 2022, HTN, hypothyroidism, alcohol abuse, thoracic aortic aneurysm and aortic stenosis who is here today as a new consult, referred by Dr. Jimmey Ralph, for further discussion regarding her aortic stenosis. She had been followed by [facility] and underwent atrial fib/flutter ablation in October 2022. She has been evaluated in our group by Dr. Jimmey Ralph. Cardioversion December 2024. Planning was underway for repeat ablation but CT prior to her ablation showed left atrial appendage thrombus. She was changed to Pradaxa from Eliquis. Her aortic valve was noted to be calcified on the CT with AV calcium score of 957. Echo 09/26/23 with LVEF=60-65%. Moderately severe aortic stenosis with mean gradient of 21 mmHg, AVA 0.83 cm2, DI 0.26, SVI 32.   She tells me today that she *** She lives in *** She is retired Engineer, manufacturing ***  Primary Care Physician: Patient, No Pcp Per Primary Cardiologist: Jimmey Ralph Referring Cardiologist: Jimmey Ralph  Past Medical History:  Diagnosis Date   Anxiety    Aortic stenosis    Asthma    Depression    Gout    Hypertension    Hypothyroidism    Mental disorder    Thoracic aortic aneurysm (HCC)    Thyroid disease     Past Surgical History:  Procedure Laterality Date   ABDOMINAL HYSTERECTOMY     APPENDECTOMY     CARDIOVERSION N/A 06/16/2017   Procedure: CARDIOVERSION;  Surgeon: Orpah Cobb, MD;  Location: [facility] ENDOSCOPY;  Service: Cardiovascular;  Laterality: N/A;   CARDIOVERSION N/A 08/13/2023   Procedure: CARDIOVERSION;  Surgeon: Wendall Stade, MD;  Location: [facility] INVASIVE CV LAB;  Service: Cardiovascular;  Laterality: N/A;   TEE WITHOUT CARDIOVERSION N/A 06/16/2017   Procedure: TRANSESOPHAGEAL ECHOCARDIOGRAM (TEE);  Surgeon: Orpah Cobb, MD;  Location: [facility] ENDOSCOPY;   Service: Cardiovascular;  Laterality: N/A;    Current Outpatient Medications  Medication Sig Dispense Refill   acetaminophen (TYLENOL) 500 MG tablet Take 500 mg by mouth every 6 (six) hours as needed for moderate pain.     albuterol (VENTOLIN HFA) 108 (90 Base) MCG/ACT inhaler Inhale 2 puffs into the lungs every 6 (six) hours as needed for wheezing or shortness of breath. (Patient taking differently: Inhale 1 puff into the lungs every 6 (six) hours as needed for wheezing or shortness of breath.) 8 g 2   amiodarone (PACERONE) 200 MG tablet Take 2 tablets (400 mg total) by mouth 2 (two) times daily for 7 days, THEN 1 tablet (200 mg total) 2 (two) times daily for 7 days, THEN 1 tablet (200 mg total) daily. 130 tablet 0   atorvastatin (LIPITOR) 20 MG tablet TAKE 1 TABLET BY MOUTH EVERY DAY. Must have office visit for refills 30 tablet 0   busPIRone (BUSPAR) 10 MG tablet Take 10 mg by mouth 3 (three) times daily.     clonazePAM (KLONOPIN) 0.5 MG tablet Take 0.5 mg by mouth 2 (two) times daily as needed for anxiety.     dabigatran (PRADAXA) 150 MG CAPS capsule Take 1 capsule (150 mg total) by mouth 2 (two) times daily. 180 capsule 3   levothyroxine (SYNTHROID) 50 MCG tablet Take 50 mcg by mouth daily before breakfast.     loperamide (IMODIUM A-D) 2 MG tablet Take 4 mg by mouth as needed for diarrhea or loose stools. (Patient not taking: Reported on 09/25/2023)  mirtazapine (REMERON) 15 MG tablet Take 15 mg by mouth at bedtime.     Vitamin D, Ergocalciferol, (DRISDOL) 1.25 MG (50000 UNIT) CAPS capsule Take 50,000 Units by mouth every Monday. (Patient not taking: Reported on 09/25/2023)     No current facility-administered medications for this visit.    Allergies  Allergen Reactions   Hyzaar [Losartan Potassium-Hctz] Other (See Comments)    Caused patient to feel light-headed and gave her vertigo   Meloxicam Rash    A "very bad" rash    Social History   Socioeconomic History   Marital status:  Single    Spouse name: Not on file   Number of children: Not on file   Years of education: Not on file   Highest education level: Not on file  Occupational History   Not on file  Tobacco Use   Smoking status: Former    Current packs/day: 1.00    Types: Cigarettes   Smokeless tobacco: Never  Vaping Use   Vaping status: Never Used  Substance and Sexual Activity   Alcohol use: Not Currently    Alcohol/week: 4.0 - 7.0 standard drinks of alcohol    Types: 1 - 3 Glasses of wine, 3 - 4 Cans of beer per week    Comment: 3 times weekly   Drug use: Yes    Types: Marijuana   Sexual activity: Not Currently    Birth control/protection: Surgical  Other Topics Concern   Not on file  Social History Narrative   Not on file   Social Drivers of Health   Financial Resource Strain: Low Risk  (12/06/2022)   Received from [facility]   Overall Financial Resource Strain (CARDIA)    Difficulty of Paying Living Expenses: Not hard at all  Food Insecurity: No Food Insecurity (12/06/2022)   Received from [facility], [facility]   Hunger Vital Sign    Worried About Running Out of Food in the Last Year: Never true    Ran Out of Food in the Last Year: Never true  Transportation Needs: No Transportation Needs (12/06/2022)   Received from [facility], [facility]   PRAPARE - Transportation    Lack of Transportation (Medical): No    Lack of Transportation (Non-Medical): No  Physical Activity: Unknown (12/06/2022)   Received from [facility], [facility]   Exercise Vital Sign    Days of Exercise per Week: 0 days    Minutes of Exercise per Session: Not on file  Stress: No Stress Concern Present (04/07/2023)   Received from [facility] - Occupational Stress Questionnaire    Feeling of Stress : Not at all  Social Connections: Socially Isolated (12/06/2022)   Received from [facility], [facility]   Social Network    How would  you rate your social network (family, work, friends)?: Little participation, lonely and socially isolated  Intimate Partner Violence: Not At Risk (04/07/2023)   Received from [facility]   HITS    Over the last 12 months how often did your partner physically hurt you?: Never    Over the last 12 months how often did your partner insult you or talk down to you?: Never    Over the last 12 months how often did your partner threaten you with physical harm?: Never    Over the last 12 months how often did your partner scream or curse at you?: Never    Family History  Problem  Relation Age of Onset   Hypertension Mother    Arthritis Father    Hypertension Father    Gout Father     Review of Systems:  As stated in the HPI and otherwise negative.   There were no vitals taken for this visit.  Physical Examination: General: Well developed, well nourished, NAD  HEENT: OP clear, mucus membranes moist  SKIN: warm, dry. No rashes. Neuro: No focal deficits  Musculoskeletal: Muscle strength 5/5 all ext  Psychiatric: Mood and affect normal  Neck: No JVD, no carotid bruits, no thyromegaly, no lymphadenopathy.  Lungs:Clear bilaterally, no wheezes, rhonci, crackles Cardiovascular: Regular rate and rhythm. *** Loud, harsh, late peaking systolic murmur.  Abdomen:Soft. Bowel sounds present. Non-tender.  Extremities: *** No lower extremity edema. Pulses are 2 + in the bilateral DP/PT.  EKG:  EKG {ACTION; IS/IS [id number]} ordered today. The ekg ordered today demonstrates ***  Echo 09/26/23:  1. Left ventricular ejection fraction, by estimation, is 60 to 65%. The  left ventricle has normal function. The left ventricle has no regional  wall motion abnormalities. Left ventricular diastolic function could not  be evaluated.   2. Right ventricular systolic function is normal. The right ventricular  size is normal.   3. Left atrial size was moderately dilated.   4. Right atrial size was mild to  moderately dilated.   5. The mitral valve is normal in structure. Trivial mitral valve  regurgitation. No evidence of mitral stenosis.   6. The aortic valve is severely calcified with fusion and restricted  excursion of valve cusps. Visually, the valve appears severely stenotic.  The mean transvalvular gradient is with an AVA by VTI of 0.83cm2  and DVI=0.26 consistent with moderate  AS. Can consider further doppler evaluation.   7. Aortic dilatation noted. 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   8. The inferior vena cava is normal in size with greater than 50%  respiratory variability, suggesting right atrial pressure of 3 mmHg.   FINDINGS   Left Ventricle: Left ventricular ejection fraction, by estimation, is 60  to 65%. The left ventricle has normal function. The left ventricle has no  regional wall motion abnormalities. The left ventricular internal cavity  size was normal in size. There is   no left ventricular hypertrophy. Left ventricular diastolic function  could not be evaluated due to atrial fibrillation. Left ventricular  diastolic function could not be evaluated.   Right Ventricle: The right ventricular size is normal. No increase in  right ventricular wall thickness. Right ventricular systolic function is  normal.   Left Atrium: Left atrial size was moderately dilated.   Right Atrium: Right atrial size was mild to moderately dilated.   Pericardium: There is no evidence of pericardial effusion.   Mitral Valve: The mitral valve is normal in structure. Trivial mitral  valve regurgitation. No evidence of mitral valve stenosis.   Tricuspid Valve: The tricuspid valve is normal in structure. Tricuspid  valve regurgitation is mild . No evidence of tricuspid stenosis.   Aortic Valve: The aortic valve is calcified. There is severe calcifcation  of the aortic valve. There is severe thickening of the aortic valve. There  is moderate to  severe aortic valve annular calcification. Aortic valve  regurgitation is trivial. Moderate   aortic stenosis is present. Aortic valve mean gradient measures 21.0  mmHg. Aortic valve peak gradient measures 39.9 mmHg. Aortic valve area, by  VTI measures 0.83 cm.   Pulmonic  Valve: The pulmonic valve was normal in structure. Pulmonic valve  regurgitation is trivial. No evidence of pulmonic stenosis.   Aorta: The aortic root is normal in size and structure and aortic  dilatation noted. There is severe dilatation of the ascending aorta,  measuring 47 mm.   Venous: The inferior vena cava is normal in size with greater than 50%  respiratory variability, suggesting right atrial pressure of 3 mmHg.   IAS/Shunts: No atrial level shunt detected by color flow Doppler.     LEFT VENTRICLE  PLAX 2D  LVIDd:         3.40 cm   Diastology  LVIDs:         2.00 cm   LV e' lateral: 9.03 cm/s  LV PW:         1.10 cm  LV IVS:        1.10 cm   2D Longitudinal Strain  LVOT diam:     2.00 cm   2D Strain GLS Avg:     22.3 %  LV SV:         56  LV SV Index:   32  LVOT Area:     3.14 cm                           3D Volume EF:                           3D EF:        58 %                           LV EDV:       117 ml                           LV ESV:       49 ml                           LV SV:        68 ml   RIGHT VENTRICLE  RV Basal diam:  3.80 cm  RV S prime:     21.30 cm/s  TAPSE (M-mode): 1.8 cm  RVSP:           45.2 mmHg   LEFT ATRIUM             Index        RIGHT ATRIUM           Index  LA diam:        4.20 cm 2.39 cm/m   RA Pressure: 3.00 mmHg  LA Vol (A2C):   53.9 ml 30.68 ml/m  RA Area:     19.50 cm  LA Vol (A4C):   80.5 ml 45.82 ml/m  RA Volume:   58.20 ml  33.13 ml/m  LA Biplane Vol: 67.3 ml 38.31 ml/m   AORTIC VALVE                     PULMONIC VALVE  AV Area (Vmax):    0.79 cm      PR End Diast Vel: 7.84 msec  AV Area (Vmean):   0.76 cm  AV Area (VTI):     0.83 cm  AV  Vmax:  316.00 cm/s  AV Vmean:          210.000 cm/s  AV VTI:            0.671 m  AV Peak Grad:      39.9 mmHg  AV Mean Grad:      21.0 mmHg  LVOT Vmax:         79.00 cm/s  LVOT Vmean:        51.000 cm/s  LVOT VTI:          0.177 m  LVOT/AV VTI ratio: 0.26    AORTA  Ao Root diam: 3.20 cm  Ao Asc diam:  4.70 cm   MITRAL VALVE              TRICUSPID VALVE  MV Area (PHT): 3.81 cm   TR Peak grad:   42.2 mmHg  MV Decel Time: 199 msec   TR Vmax:        325.00 cm/s  MR Peak grad: 71.6 mmHg   Estimated RAP:  3.00 mmHg  MR Mean grad: 35.0 mmHg   RVSP:           45.2 mmHg  MR Vmax:      423.00 cm/s  MR Vmean:     248.0 cm/s  SHUNTS                            Systemic VTI:  0.18 m                            Systemic Diam: 2.00 cm   Recent Labs: 08/04/2023: TSH 1.800 09/25/2023: BUN 20; Creatinine, Ser 1.07; Hemoglobin 14.6; Platelets 182; Potassium 4.6; Sodium 142    Wt Readings from Last 3 Encounters:  09/25/23 62 kg  08/04/23 60.9 kg  12/28/20 75.2 kg    Assessment and Plan:   1. Severe Aortic Valve Stenosis: She has moderately severe aortic stenosis. She has NYHA class *** symptoms. I have personally reviewed the echo images. The aortic valve is thickened and calcified with limited leaflet mobility. I think *** would benefit from AVR. Given advanced age, *** is not a good candidate for conventional AVR by surgical approach. I think *** may be a good candidate for TAVR.   I have reviewed the natural history of aortic stenosis with the patient and their family members  who are present today. We have discussed the limitations of medical therapy and the poor prognosis associated with symptomatic aortic stenosis. We have reviewed potential treatment options, including palliative medical therapy, conventional surgical aortic valve replacement, and transcatheter aortic valve replacement. We discussed treatment options in the context of the patient's specific comorbid medical conditions.     *** would like to proceed with planning for TAVR. I will arrange a right and left heart catheterization at [facility] ***. Risks and benefits of the cath procedure and the valve procedure are reviewed with the patient. After the cath, *** will have a cardiac CT, CTA of the chest/abdomen and pelvis and will then be referred to see one of the CT surgeons on our TAVR team.     Labs/ tests ordered today include:  No orders of the defined types were placed in this encounter.    Disposition:   F/U will be arranged with the structural team  Signed, Verne Carrow, MD, [facility] 10/29/2023 2:18 PM    [facility]  [facility] [address] [place], [place], [place]  [address] Phone: [phone]; Fax: [phone]

## 2023-10-29 NOTE — H&P (View-Only) (Signed)
 [facility] Consult Note  Chief Complaint  Patient presents with   New Patient (Initial Visit)    Aortic stenosis   History of Present Illness: 68 yo female with history of persistent atrial fibrillation and atrial flutter s/p ablation in October 2022, HTN, hypothyroidism, alcohol abuse, thoracic aortic aneurysm, tobacco abuse and aortic stenosis who is here today as a new consult, referred by Dr. Jimmey Ralph, for further discussion regarding her aortic stenosis. She had been followed by [facility] and underwent atrial fib/flutter ablation in October 2022. She has been evaluated in our group by Dr. Jimmey Ralph. Cardioversion December 2024. Planning was underway for repeat ablation but CT prior to her ablation showed left atrial appendage thrombus. She was changed to Pradaxa from Eliquis. Her aortic valve was noted to be calcified on the CT with AV calcium score of 957. Echo 09/26/23 with LVEF=60-65%. Moderately severe aortic stenosis with mean gradient of 21 mmHg, AVA 0.83 cm2, DI 0.26, SVI 32.   She tells me today that she feels well overall. Rare dizziness with standing. No chest pain, dyspnea, palpitations, syncope or LE edema. She lives in [place] alone. She does not work.   Primary Care Physician: Patient, No Pcp Per [facility]) Primary Cardiologist: Jimmey Ralph Referring Cardiologist: Jimmey Ralph  Past Medical History:  Diagnosis Date   Anxiety    Aortic stenosis    Asthma    Depression    Gout    Hypertension    Hypothyroidism    Mental disorder    Thoracic aortic aneurysm [facility])    Thyroid disease     Past Surgical History:  Procedure Laterality Date   ABDOMINAL HYSTERECTOMY     APPENDECTOMY     CARDIOVERSION N/A 06/16/2017   Procedure: CARDIOVERSION;  Surgeon: Orpah Cobb, MD;  Location: [facility] ENDOSCOPY;  Service: Cardiovascular;  Laterality: N/A;   CARDIOVERSION N/A 08/13/2023   Procedure: CARDIOVERSION;  Surgeon: Wendall Stade, MD;  Location: [facility] INVASIVE CV  LAB;  Service: Cardiovascular;  Laterality: N/A;   TEE WITHOUT CARDIOVERSION N/A 06/16/2017   Procedure: TRANSESOPHAGEAL ECHOCARDIOGRAM (TEE);  Surgeon: Orpah Cobb, MD;  Location: [facility] END[facility];  Service: Cardiovascular;  Laterality: N/A;    Current Outpatient Medications  Medication Sig Dispense Refill   acetaminophen (TYLENOL) 500 MG tablet Take 500 mg by mouth every 6 (six) hours as needed for moderate pain.     albuterol (VENTOLIN HFA) 108 (90 Base) MCG/ACT inhaler Inhale 2 puffs into the lungs every 6 (six) hours as needed for wheezing or shortness of breath. (Patient taking differently: Inhale 1 puff into the lungs every 6 (six) hours as needed for wheezing or shortness of breath.) 8 g 2   amiodarone (PACERONE) 200 MG tablet Take 2 tablets (400 mg total) by mouth 2 (two) times daily for 7 days, THEN 1 tablet (200 mg total) 2 (two) times daily for 7 days, THEN 1 tablet (200 mg total) daily. 130 tablet 0   atorvastatin (LIPITOR) 20 MG tablet TAKE 1 TABLET BY MOUTH EVERY DAY. Must have office visit for refills 30 tablet 0   busPIRone (BUSPAR) 10 MG tablet Take 10 mg by mouth 3 (three) times daily.     clonazePAM (KLONOPIN) 0.5 MG tablet Take 0.5 mg by mouth 2 (two) times daily as needed for anxiety.     dabigatran (PRADAXA) 150 MG CAPS capsule Take 1 capsule (150 mg total) by mouth 2 (two) times daily. 180 capsule 3   digoxin (LANOXIN) 0.125 MG tablet Take 125 mcg  by mouth daily.     levothyroxine (SYNTHROID) 50 MCG tablet Take 50 mcg by mouth daily before breakfast.     loperamide (IMODIUM A-D) 2 MG tablet Take 4 mg by mouth as needed for diarrhea or loose stools.     mirtazapine (REMERON) 15 MG tablet Take 15 mg by mouth at bedtime.     Vitamin D, Ergocalciferol, (DRISDOL) 1.25 MG (50000 UNIT) CAPS capsule Take 50,000 Units by mouth every Monday.     No current facility-administered medications for this visit.    Allergies  Allergen Reactions   Hyzaar [Losartan Potassium-Hctz] Other  (See Comments)    Caused patient to feel light-headed and gave her vertigo   Meloxicam Rash    A "very bad" rash    Social History   Socioeconomic History   Marital status: Single    Spouse name: Not on file   Number of children: 1   Years of education: Not on file   Highest education level: Not on file  Occupational History   Not on file  Tobacco Use   Smoking status: Every Day    Current packs/day: 1.00    Types: Cigarettes   Smokeless tobacco: Never  Vaping Use   Vaping status: Never Used  Substance and Sexual Activity   Alcohol use: Not Currently    Alcohol/week: 4.0 - 7.0 standard drinks of alcohol    Types: 1 - 3 Glasses of wine, 3 - 4 Cans of beer per week    Comment: 3 times weekly   Drug use: Yes    Types: Marijuana   Sexual activity: Not Currently    Birth control/protection: Surgical  Other Topics Concern   Not on file  Social History Narrative   Not on file   Social Drivers of Health   Financial Resource Strain: Low Risk  (12/06/2022)   Received from [facility]   Overall Financial Resource Strain (CARDIA)    Difficulty of Paying Living Expenses: Not hard at all  Food Insecurity: No Food Insecurity (12/06/2022)   Received from [facility], [facility]   Hunger Vital Sign    Worried About Running Out of Food in the Last Year: Never true    Ran Out of Food in the Last Year: Never true  Transportation Needs: No Transportation Needs (12/06/2022)   Received from [facility]   PRAPARE - Transportation    Lack of Transportation (Medical): No    Lack of Transportation (Non-Medical): No  Physical Activity: Unknown (12/06/2022)   Received from [facility]   Exercise Vital Sign    Days of Exercise per Week: 0 days    Minutes of Exercise per Session: Not on file  Stress: No Stress Concern Present (04/07/2023)   Received from [facility] - Occupational Stress  Questionnaire    Feeling of Stress : Not at all  Social Connections: Socially Isolated (12/06/2022)   Received from [facility], [facility]   Social Network    How would you rate your social network (family, work, friends)?: Little participation, lonely and socially isolated  Intimate Partner Violence: Not At Risk (04/07/2023)   Received from [facility]   HITS    Over the last 12 months how often did your partner physically hurt you?: Never    Over the last 12 months how often did your partner insult you or talk down to you?: Never    Over the last  12 months how often did your partner threaten you with physical harm?: Never    Over the last 12 months how often did your partner scream or curse at you?: Never    Family History  Problem Relation Age of Onset   Hypertension Mother    Arthritis Father    Hypertension Father    Gout Father    Prostate cancer Father     Review of Systems:  As stated in the HPI and otherwise negative.   BP 128/80   Pulse (!) 54   Ht 5\' 9"  (1.753 m)   Wt 61.9 kg   SpO2 94%   BMI 20.14 kg/m   Physical Examination: General: Well developed, well nourished, NAD  HEENT: OP clear, mucus membranes moist  SKIN: warm, dry. No rashes. Neuro: No focal deficits  Musculoskeletal: Muscle strength 5/5 all ext  Psychiatric: Mood and affect normal  Neck: No JVD, no carotid bruits, no thyromegaly, no lymphadenopathy.  Lungs:Clear bilaterally, no wheezes, rhonci, crackles Cardiovascular: Regular rate and rhythm. Loud, harsh, late peaking systolic murmur.  Abdomen:Soft. Bowel sounds present. Non-tender.  Extremities: No lower extremity edema. Pulses are 2 + in the bilateral DP/PT.  EKG:  EKG is not ordered today. The ekg ordered today demonstrates   Echo 09/26/23:  1. Left ventricular ejection fraction, by estimation, is 60 to 65%. The  left ventricle has normal function. The left ventricle has no regional  wall motion abnormalities. Left ventricular  diastolic function could not  be evaluated.   2. Right ventricular systolic function is normal. The right ventricular  size is normal.   3. Left atrial size was moderately dilated.   4. Right atrial size was mild to moderately dilated.   5. The mitral valve is normal in structure. Trivial mitral valve  regurgitation. No evidence of mitral stenosis.   6. The aortic valve is severely calcified with fusion and restricted  excursion of valve cusps. Visually, the valve appears severely stenotic.  The mean transvalvular gradient is with an AVA by VTI of 0.83cm2  and DVI=0.26 consistent with moderate  AS. Can consider further doppler evaluation.   7. Aortic dilatation noted. 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   8. The inferior vena cava is normal in size with greater than 50%  respiratory variability, suggesting right atrial pressure of 3 mmHg.   FINDINGS   Left Ventricle: Left ventricular ejection fraction, by estimation, is 60  to 65%. The left ventricle has normal function. The left ventricle has no  regional wall motion abnormalities. The left ventricular internal cavity  size was normal in size. There is   no left ventricular hypertrophy. Left ventricular diastolic function  could not be evaluated due to atrial fibrillation. Left ventricular  diastolic function could not be evaluated.   Right Ventricle: The right ventricular size is normal. No increase in  right ventricular wall thickness. Right ventricular systolic function is  normal.   Left Atrium: Left atrial size was moderately dilated.   Right Atrium: Right atrial size was mild to moderately dilated.   Pericardium: There is no evidence of pericardial effusion.   Mitral Valve: The mitral valve is normal in structure. Trivial mitral  valve regurgitation. No evidence of mitral valve stenosis.   Tricuspid Valve: The tricuspid valve is normal in structure. Tricuspid  valve regurgitation  is mild . No evidence of tricuspid stenosis.   Aortic Valve: The aortic valve is calcified. There is severe calcifcation  of the aortic valve. There is severe thickening of the aortic valve. There  is moderate to severe aortic valve annular calcification. Aortic valve  regurgitation is trivial. Moderate   aortic stenosis is present. Aortic valve mean gradient measures 21.0  mmHg. Aortic valve peak gradient measures 39.9 mmHg. Aortic valve area, by  VTI measures 0.83 cm.   Pulmonic Valve: The pulmonic valve was normal in structure. Pulmonic valve  regurgitation is trivial. No evidence of pulmonic stenosis.   Aorta: The aortic root is normal in size and structure and aortic  dilatation noted. There is severe dilatation of the ascending aorta,  measuring 47 mm.   Venous: The inferior vena cava is normal in size with greater than 50%  respiratory variability, suggesting right atrial pressure of 3 mmHg.   IAS/Shunts: No atrial level shunt detected by color flow Doppler.     LEFT VENTRICLE  PLAX 2D  LVIDd:         3.40 cm   Diastology  LVIDs:         2.00 cm   LV e' lateral: 9.03 cm/s  LV PW:         1.10 cm  LV IVS:        1.10 cm   2D Longitudinal Strain  LVOT diam:     2.00 cm   2D Strain GLS Avg:     22.3 %  LV SV:         56  LV SV Index:   32  LVOT Area:     3.14 cm                           3D Volume EF:                           3D EF:        58 %                           LV EDV:       117 ml                           LV ESV:       49 ml                           LV SV:        68 ml   RIGHT VENTRICLE  RV Basal diam:  3.80 cm  RV S prime:     21.30 cm/s  TAPSE (M-mode): 1.8 cm  RVSP:           45.2 mmHg   LEFT ATRIUM             Index        RIGHT ATRIUM           Index  LA diam:        4.20 cm 2.39 cm/m   RA Pressure: 3.00 mmHg  LA Vol (A2C):   53.9 ml 30.68 ml/m  RA Area:     19.50 cm  LA Vol (A4C):   80.5 ml 45.82 ml/m  RA Volume:   58.20 ml  33.13 ml/m   LA Biplane Vol: 67.3 ml 38.31 ml/m   AORTIC VALVE  PULMONIC VALVE  AV Area (Vmax):    0.79 cm      PR End Diast Vel: 7.84 msec  AV Area (Vmean):   0.76 cm  AV Area (VTI):     0.83 cm  AV Vmax:           316.00 cm/s  AV Vmean:          210.000 cm/s  AV VTI:            0.671 m  AV Peak Grad:      39.9 mmHg  AV Mean Grad:      21.0 mmHg  LVOT Vmax:         79.00 cm/s  LVOT Vmean:        51.000 cm/s  LVOT VTI:          0.177 m  LVOT/AV VTI ratio: 0.26    AORTA  Ao Root diam: 3.20 cm  Ao Asc diam:  4.70 cm   MITRAL VALVE              TRICUSPID VALVE  MV Area (PHT): 3.81 cm   TR Peak grad:   42.2 mmHg  MV Decel Time: 199 msec   TR Vmax:        325.00 cm/s  MR Peak grad: 71.6 mmHg   Estimated RAP:  3.00 mmHg  MR Mean grad: 35.0 mmHg   RVSP:           45.2 mmHg  MR Vmax:      423.00 cm/s  MR Vmean:     248.0 cm/s  SHUNTS                            Systemic VTI:  0.18 m                            Systemic Diam: 2.00 cm   Recent Labs: 08/04/2023: TSH 1.800 09/25/2023: BUN 20; Creatinine, Ser 1.07; Hemoglobin 14.6; Platelets 182; Potassium 4.6; Sodium 142    Wt Readings from Last 3 Encounters:  10/30/23 61.9 kg  09/25/23 62 kg  08/04/23 60.9 kg    Assessment and Plan:   1. Severe Aortic Valve Stenosis: She has moderately severe aortic stenosis. NYHA class 1. I have personally reviewed the echo images. The aortic valve is thickened and calcified but does open fairly well. Her echo data and AV calcium score are consistent with moderate AS. She has no symptoms related to AS. She would be a candidate for surgical AVR or TAVR when her disease progresses or she becomes symptomatic.    I have reviewed the natural history of aortic stenosis with the patient and their family members  who are present today. We have discussed the limitations of medical therapy and the poor prognosis associated with symptomatic aortic stenosis. We have reviewed potential treatment options,  including palliative medical therapy, conventional surgical aortic valve replacement, and transcatheter aortic valve replacement. We discussed treatment options in the context of the patient's specific comorbid medical conditions.   I will plan to repeat her echo in 6 months and I will see her back after that. She will call with any change in her symptoms in the meantime.     Labs/ tests ordered today include:   Orders Placed This Encounter  Procedures   ECHOCARDIOGRAM COMPLETE   Disposition:   F/U with me in 6 months  Signed, Verne Carrow, MD, [facility] 10/30/2023 9:22 AM    [facility] [address] [place], [place], [place]  [address] Phone: [phone]; Fax: [phone]

## 2023-10-30 ENCOUNTER — Ambulatory Visit: Payer: Medicare HMO | Attending: Cardiovascular Disease | Admitting: Cardiovascular Disease

## 2023-10-30 ENCOUNTER — Encounter: Payer: Self-pay | Admitting: Cardiovascular Disease

## 2023-10-30 VITALS — BP 128/80 | HR 54 | Ht 69.0 in | Wt 136.4 lb

## 2023-10-30 DIAGNOSIS — I350 Nonrheumatic aortic (valve) stenosis: Secondary | ICD-10-CM | POA: Diagnosis not present

## 2023-10-30 NOTE — Patient Instructions (Addendum)
 Medication Instructions:  No changes *If you need a refill on your cardiac medications before your next appointment, please call your pharmacy*   Lab Work: none   Testing/Procedures: echo due in about 6 months (August) Your physician has requested that you have an echocardiogram. Echocardiography is a painless test that uses sound waves to create images of your heart. It provides your doctor with information about the size and shape of your heart and how well your heart's chambers and valves are working. This procedure takes approximately one hour. There are no restrictions for this procedure. Please do NOT wear cologne, perfume, aftershave, or lotions (deodorant is allowed). Please arrive 15 minutes prior to your appointment time.  Please note: We ask at that you not bring children with you during ultrasound (echo/ vascular) testing. Due to room size and safety concerns, children are not allowed in the ultrasound rooms during exams. Our front office staff cannot provide observation of children in our lobby area while testing is being conducted. An adult accompanying a patient to their appointment will only be allowed in the ultrasound room at the discretion of the ultrasound technician under special circumstances. We apologize for any inconvenience.   Follow-Up: At [facility], you and your health needs are our priority.  As part of our continuing mission to provide you with exceptional heart care, we have created designated Provider Care Teams.  These Care Teams include your primary Cardiologist (physician) and Advanced Practice Providers (APPs -  Physician Assistants and Nurse Practitioners) who all work together to provide you with the care you need, when you need it.   Your next appointment:   7-8 month(s)  Provider:   Verne Carrow, MD Other Instructions

## 2023-10-31 ENCOUNTER — Ambulatory Visit: Payer: Medicare HMO | Admitting: Pulmonary Disease

## 2023-11-03 ENCOUNTER — Other Ambulatory Visit: Payer: Self-pay | Admitting: Cardiology

## 2023-11-11 ENCOUNTER — Ambulatory Visit: Payer: Medicare HMO | Admitting: Internal Medicine

## 2023-11-17 NOTE — Progress Notes (Signed)
 Unable to reach patient about procedure, but was able to leave a detailed message. Stated that the patient needed to arrive at the hospital at 0645 , remain NPO after 0000, needs to have a ride home and a responsible adult to stay with them for 24 hours after the procedure. Instructed the patient to call back if they had any questions.

## 2023-11-18 ENCOUNTER — Ambulatory Visit (HOSPITAL_COMMUNITY)
Admission: RE | Admit: 2023-11-18 | Discharge: 2023-11-18 | Disposition: A | Discharge status: Home / Self Care | Attending: Cardiology | Admitting: Cardiology

## 2023-11-18 ENCOUNTER — Other Ambulatory Visit: Payer: Self-pay

## 2023-11-18 ENCOUNTER — Ambulatory Visit (HOSPITAL_COMMUNITY)
Admission: RE | Admit: 2023-11-18 | Discharge: 2023-11-18 | Disposition: A | Discharge status: Home / Self Care | Source: Ambulatory Visit | Attending: Cardiology | Admitting: Cardiology

## 2023-11-18 ENCOUNTER — Ambulatory Visit (HOSPITAL_COMMUNITY): Payer: Self-pay

## 2023-11-18 ENCOUNTER — Encounter (HOSPITAL_COMMUNITY)
Admission: RE | Disposition: A | Discharge status: Home / Self Care | Payer: Self-pay | Source: Home / Self Care | Attending: Cardiology

## 2023-11-18 ENCOUNTER — Ambulatory Visit (HOSPITAL_BASED_OUTPATIENT_CLINIC_OR_DEPARTMENT_OTHER): Payer: Self-pay

## 2023-11-18 DIAGNOSIS — F17210 Nicotine dependence, cigarettes, uncomplicated: Secondary | ICD-10-CM | POA: Insufficient documentation

## 2023-11-18 DIAGNOSIS — I513 Intracardiac thrombosis, not elsewhere classified: Secondary | ICD-10-CM

## 2023-11-18 DIAGNOSIS — Z01818 Encounter for other preprocedural examination: Secondary | ICD-10-CM | POA: Diagnosis not present

## 2023-11-18 DIAGNOSIS — I10 Essential (primary) hypertension: Secondary | ICD-10-CM | POA: Diagnosis not present

## 2023-11-18 DIAGNOSIS — Z8261 Family history of arthritis: Secondary | ICD-10-CM | POA: Insufficient documentation

## 2023-11-18 DIAGNOSIS — Z8249 Family history of ischemic heart disease and other diseases of the circulatory system: Secondary | ICD-10-CM | POA: Insufficient documentation

## 2023-11-18 DIAGNOSIS — J45909 Unspecified asthma, uncomplicated: Secondary | ICD-10-CM | POA: Diagnosis not present

## 2023-11-18 DIAGNOSIS — F418 Other specified anxiety disorders: Secondary | ICD-10-CM

## 2023-11-18 DIAGNOSIS — I4819 Other persistent atrial fibrillation: Secondary | ICD-10-CM | POA: Insufficient documentation

## 2023-11-18 DIAGNOSIS — M1990 Unspecified osteoarthritis, unspecified site: Secondary | ICD-10-CM | POA: Diagnosis not present

## 2023-11-18 DIAGNOSIS — I350 Nonrheumatic aortic (valve) stenosis: Secondary | ICD-10-CM | POA: Diagnosis not present

## 2023-11-18 DIAGNOSIS — Z7902 Long term (current) use of antithrombotics/antiplatelets: Secondary | ICD-10-CM | POA: Diagnosis not present

## 2023-11-18 LAB — POCT I-STAT, CHEM 8
BUN: 10 mg/dL (ref 8–23)
Calcium, Ion: 1.12 mmol/L — ABNORMAL LOW (ref 1.15–1.40)
Chloride: 99 mmol/L (ref 98–111)
Creatinine, Ser: 1 mg/dL (ref 0.44–1.00)
Glucose, Bld: 99 mg/dL (ref 70–99)
HCT: 47 % — ABNORMAL HIGH (ref 36.0–46.0)
Hemoglobin: 16 g/dL — ABNORMAL HIGH (ref 12.0–15.0)
Potassium: 4.4 mmol/L (ref 3.5–5.1)
Sodium: 138 mmol/L (ref 135–145)
TCO2: 30 mmol/L (ref 22–32)

## 2023-11-18 SURGERY — TRANSESOPHAGEAL ECHOCARDIOGRAM (TEE) (CATHLAB)
Anesthesia: Monitor Anesthesia Care

## 2023-11-18 MED ORDER — PROPOFOL 500 MG/50ML IV EMUL
INTRAVENOUS | Status: DC | PRN
  Administered 2023-11-18: 140 ug/kg/min via INTRAVENOUS

## 2023-11-18 MED ORDER — LIDOCAINE 2% (20 MG/ML) 5 ML SYRINGE
INTRAMUSCULAR | Status: DC | PRN
  Administered 2023-11-18: 60 mg via INTRAVENOUS

## 2023-11-18 MED ORDER — IPRATROPIUM-ALBUTEROL 0.5-2.5 (3) MG/3ML IN SOLN
3.0000 mL | Freq: Once | RESPIRATORY_TRACT | Status: AC
  Administered 2023-11-18: 3 mL via RESPIRATORY_TRACT

## 2023-11-18 MED ORDER — IPRATROPIUM-ALBUTEROL 0.5-2.5 (3) MG/3ML IN SOLN
RESPIRATORY_TRACT | Status: AC
  Filled 2023-11-18: qty 3

## 2023-11-18 MED ORDER — PROPOFOL 10 MG/ML IV BOLUS
INTRAVENOUS | Status: DC | PRN
  Administered 2023-11-18: 30 mg via INTRAVENOUS
  Administered 2023-11-18: 20 mg via INTRAVENOUS
  Administered 2023-11-18: 50 mg via INTRAVENOUS

## 2023-11-18 NOTE — CV Procedure (Signed)
   Transesophageal Echocardiogram  Indications: Left atrial appendage thrombus seen on CT scan  Time out performed  During this procedure the patient was administered propofol under anesthesiology supervision to achieve and maintain moderate sedation.  The patient's heart rate, blood pressure, and oxygen saturation are monitored continuously during the procedure.  After obtaining views of the left atrial appendage including 3D imagery, we withdrew the probe secondary to increased coughing and secretions.  She responded well to therapy.  Findings:  There is no evidence of left atrial appendage thrombus on transesophageal echocardiogram.  Donato Schultz, MD

## 2023-11-18 NOTE — Addendum Note (Signed)
 Addendum  created 11/18/23 1000 by Darlina Guys, CRNA   Flowsheet accepted

## 2023-11-18 NOTE — Interval H&P Note (Signed)
 History and Physical Interval Note:  11/18/2023 7:45 AM Prior CT showed LA thrombus. Here for evaluation with TEE.  Anita Russell  has presented today for surgery, with the diagnosis of LEFT APPENDAGE THROMBUS.  The various methods of treatment have been discussed with the patient and family. After consideration of risks, benefits and other options for treatment, the patient has consented to  Procedure(s): TRANSESOPHAGEAL ECHOCARDIOGRAM (N/A) as a surgical intervention.  The patient's history has been reviewed, patient examined, no change in status, stable for surgery.  I have reviewed the patient's chart and labs.  Questions were answered to the patient's satisfaction.     [employer]

## 2023-11-18 NOTE — Discharge Instructions (Signed)

## 2023-11-18 NOTE — Anesthesia Postprocedure Evaluation (Signed)
 Anesthesia Post Note  Patient: Anita Russell  Procedure(s) Performed: TRANSESOPHAGEAL ECHOCARDIOGRAM     Patient location during evaluation: PACU Anesthesia Type: MAC Level of consciousness: awake and alert Pain management: pain level controlled Vital Signs Assessment: post-procedure vital signs reviewed and stable Respiratory status: spontaneous breathing, nonlabored ventilation, respiratory function stable and patient connected to nasal cannula oxygen Cardiovascular status: stable and blood pressure returned to baseline Postop Assessment: no apparent nausea or vomiting Anesthetic complications: no  No notable events documented.  Last Vitals:  Vitals:   11/18/23 0910 11/18/23 0920  BP: 117/69 127/65  Pulse: (!) 52 (!) 47  Resp: 11 13  Temp:    SpO2: 100% 98%    Last Pain:  Vitals:   11/18/23 0707  TempSrc:   PainSc: 3                  Shelton Silvas

## 2023-11-18 NOTE — Progress Notes (Signed)
 Pt continuing to cough, and verbalized she feels "tight" RN auscultated wheezed. Dr. Hart Rochester called and verbal orders given for a duo-neb treatment. Pt on RA. Post treatment lung fields clear. Pt verbalized feeling slightly better.

## 2023-11-18 NOTE — Anesthesia Preprocedure Evaluation (Addendum)
 Anesthesia Evaluation  Patient identified by MRN, date of birth, ID band Patient awake    Reviewed: Allergy & Precautions, NPO status , Patient's Chart, lab work & pertinent test results  Airway Mallampati: II  TM Distance: >3 FB Neck ROM: Full    Dental  (+) Teeth Intact, Dental Advisory Given   Pulmonary asthma , Current Smoker and Patient abstained from smoking.    + decreased breath sounds      Cardiovascular hypertension, + Valvular Problems/Murmurs AS  Rhythm:Regular Rate:Normal  Echo:  1. Left ventricular ejection fraction, by estimation, is 60 to 65%. The  left ventricle has normal function. The left ventricle has no regional  wall motion abnormalities. Left ventricular diastolic function could not  be evaluated.   2. Right ventricular systolic function is normal. The right ventricular  size is normal.   3. Left atrial size was moderately dilated.   4. Right atrial size was mild to moderately dilated.   5. The mitral valve is normal in structure. Trivial mitral valve  regurgitation. No evidence of mitral stenosis.   6. The aortic valve is severely calcified with fusion and restricted  excursion of valve cusps. Visually, the valve appears severely stenotic.  The mean transvalvular gradient is with an AVA by VTI of 0.83cm2  and DVI=0.26 consistent with moderate  AS. Can consider further doppler evaluation.   7. Aortic dilatation noted. 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   8. The inferior vena cava is normal in size with greater than 50%  respiratory variability, suggesting right atrial pressure of 3 mmHg.     Neuro/Psych  PSYCHIATRIC DISORDERS Anxiety Depression       GI/Hepatic negative GI ROS, Neg liver ROS,,,  Endo/Other  Hypothyroidism    Renal/GU negative Renal ROS     Musculoskeletal  (+) Arthritis ,    Abdominal   Peds  Hematology negative hematology  ROS (+)   Anesthesia Other Findings   Reproductive/Obstetrics                             Anesthesia Physical Anesthesia Plan  ASA: 3  Anesthesia Plan: MAC   Post-op Pain Management: Tylenol PO (pre-op)*   Induction: Intravenous  PONV Risk Score and Plan: 3 and Ondansetron, Dexamethasone and Midazolam  Airway Management Planned: Natural Airway and Nasal Cannula  Additional Equipment: None  Intra-op Plan:   Post-operative Plan:   Informed Consent: I have reviewed the patients History and Physical, chart, labs and discussed the procedure including the risks, benefits and alternatives for the proposed anesthesia with the patient or authorized representative who has indicated his/her understanding and acceptance.       Plan Discussed with: CRNA  Anesthesia Plan Comments:        Anesthesia Quick Evaluation

## 2023-11-18 NOTE — Transfer of Care (Signed)
 Immediate Anesthesia Transfer of Care Note  Patient: Anita Russell  Procedure(s) Performed: TRANSESOPHAGEAL ECHOCARDIOGRAM  Patient Location: PACU; cath lab  Anesthesia Type:MAC  Level of Consciousness: awake  Airway & Oxygen Therapy: Patient Spontanous Breathing and Patient connected to face mask oxygen  Post-op Assessment: Report given to RN and Post -op Vital signs reviewed and stable  Post vital signs: Reviewed and stable  Last Vitals:  Vitals Value Taken Time  BP 147/77 11/18/23  Temp    Pulse 54 11/18/23 0837  Resp 15 11/18/23 0837  SpO2 99 % 11/18/23 0837  Vitals shown include unfiled device data.  Last Pain:  Vitals:   11/18/23 0707  TempSrc:   PainSc: 3          Complications: No notable events documented.

## 2023-11-19 ENCOUNTER — Encounter (HOSPITAL_COMMUNITY): Payer: Self-pay | Admitting: Cardiology

## 2023-11-27 LAB — ECHO TEE

## 2023-12-04 NOTE — Progress Notes (Signed)
 Electrophysiology Office Note:   Date:  12/05/2023  ID:  Anita Russell, DOB Dec 14, 1955, MRN [id number]  Primary Cardiologist: Verne Carrow, MD Electrophysiologist: Nobie Putnam, MD      History of Present Illness:   Anita Russell is a 68 y.o. female with h/o persistent atrial fibrillation and atrial flutter s/p PVI and CTI ablation on 06/22/21, hypertension, hypothyroidism and EtOH abuse who is being seen for evaluation of atrial flutter. Patient was previously followed by [facility] for her atrial arrhythmias.  She underwent a PVI and CTI ablation in 05/2021.  It seems that she did have recurrence of atrial flutter rather quickly after procedure and there was some concern that her flutter was atypical in nature.  During our prior visits we discussed redo catheter ablation.  She underwent preprocedure CT and was found to have left atrial thrombus, spite being on Eliquis.  She was changed to Pradaxa and repeat imaging showed resolution of her thrombus.  She was also found to have moderate to severe aortic stenosis and underwent evaluation by structural cardiology and did not feel anything was warranted at this time.  Discussed the use of AI scribe software for clinical note transcription with the patient, who gave verbal consent to proceed.  History of Present Illness She is currently in normal sinus rhythm. She is on amiodarone, which has effectively maintained her rhythm. During preparation for an ablation procedure, a CT scan identified a clot, leading to a change in her blood thinner medication. The clot has since resolved. No episodes of heart fluttering or racing since her last visit. She has had no known recurrences of AF or AFL.  She experiences increased anxiety, attributing it to her living situation and interactions with others. She has been more physically active, walking when the weather permits, and reports feeling better overall, except for the anxiety. She has started taking  Privigen in hopes of improving her memory, which she feels has been declining. She has been on it for two months. She has used her inhaler once in the past few months due to pollen exposure, which she describes as a significant trigger for her breathing issues.   Review of systems complete and found to be negative unless listed in HPI.   EP Information / Studies Reviewed:    EKG is ordered today. Personal review as below.  EKG Interpretation Date/Time:  Friday December 05 2023 08:27:19 EDT Ventricular Rate:  61 PR Interval:  198 QRS Duration:  82 QT Interval:  400 QTC Calculation: 402 R Axis:   67  Text Interpretation: Normal sinus rhythm ST & T wave abnormality, consider inferior ischemia ST & T wave abnormality, consider anterolateral ischemia When compared with ECG of 25-Sep-2023 15:40,no significant change. Confirmed by Nobie Putnam [phone]) on 12/05/2023 8:59:56 AM   EKG 01/14/21: Atrial flutter with 2:1 conduction.  06/22/21 Catheter Ablation ([facility]):  The pulmonary veins did not isolate with first pass ablation, and both  sides required mapping to find additional connections. We eventually  achieved isolation, and despite use of isuprel, no reconnection seen.  Despite isuprel and multiple attempts at induction, I was unable to induce  an atypical flutter. Decision to proceed with empiric cavotricuspid  isthmus radiofrequency ablation.   All lesions were applied at 40-50W between 5-15 seconds in duration. The  pentaray catheter was used to assess isolation of all pulmonary veins. A  figure of eight suture was placed at each accessed femoral area without  tying the knot. A stopcock was placed over the  suture and locked to add  pressure on top of the venotomy sites. The area was assessed for  hemostasis and presence of pulse prior to patient leaving the room..   EP study summary:  1. Abnromal sinus node function with a cSNRT of  2. HV 41ms  3. 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  a. AH jump was present  b. AVB CL:  4. No arrhythmia could be induced with pacing maneuvers at the end of the  case.  5. S5 protocol was not performed  6. cardioversion was performed  7. TIT: in both directions post ablation. Confirmed with  differential pacing.   Assessment:  Wide antral circumferential ablation (WACA) of all pulmonary veins  cavotricuspid isthmus radiofrequency ablation for typical atrial flutter  Intracardiac echocardiogram for 3D anatomy, TSP x2.  Comprehensive EP study   Risk Assessment/Calculations:    CHA2DS2-VASc Score = 4   This indicates a 4.8% annual risk of stroke. The patient's score is based upon: CHF History: 0 HTN History: 1 Diabetes History: 0 Stroke History: 0 Vascular Disease History: 1 Age Score: 1 Gender Score: 1         Physical Exam:   VS:  BP 98/60   Pulse 61   Ht 5\' 9"  (1.753 m)   Wt 132 lb 3.2 oz (60 kg)   SpO2 95%   BMI 19.52 kg/m    Wt Readings from Last 3 Encounters:  12/05/23 132 lb 3.2 oz (60 kg)  11/18/23 134 lb (60.8 kg)  10/30/23 136 lb 6.4 oz (61.9 kg)     GEN: Well nourished, well developed in no acute distress NECK: No JVD. CARDIAC: Normal rate, regular rhythm. 3/6 systolic ejection murmur.  RESPIRATORY:  Clear to auscultation without rales, wheezing or rhonchi  ABDOMEN: Soft, non-distended EXTREMITIES:  No edema; No deformity   ASSESSMENT AND PLAN:   Anita Russell is a 68 y.o. female with h/o persistent atrial fibrillation and atrial flutter s/p PVI and CTI ablation on 06/22/21, hypertension, hypothyroidism and EtOH abuse who is being seen for evaluation of atrial flutter. Patient was previously followed by [facility] for her atrial arrhythmias.  She underwent a PVI and CTI ablation in 05/2021.  It seems that she did have recurrence of atrial flutter rather quickly after procedure and there was some concern that her flutter was atypical in nature.   During our prior visits we discussed redo catheter ablation.  She underwent preprocedure CT and was found to have left atrial thrombus, spite being on Eliquis.  She was changed to Pradaxa and repeat imaging showed resolution of her thrombus.  She was also found to have moderate to severe aortic stenosis and underwent evaluation by structural cardiology and did not feel anything was warranted at this time.  #. Symptomatic atrial flutter: Typical in appearance based on EKG; however, prior EP notes from [facility] suspected atypical flutter in nature given prior CTI ablation. #. Persistent atrial fibrillation: No known recurrences of atrial fibrillation however she has been atrial flutter for some time. #. High risk medication use: Antiarrhythmic drug therapy -amiodarone.  TSH normal 08/04/2023.  - She has been well controlled since starting amiodarone. Given her mod-sev AS and other comorbidities, we have decided to pause on ablation for now and continue with amiodarone as long as it is working. Continue amiodarone 200 mg once daily.  We will check LFTs at this visit. Repeat TSH, LFTs and order PFTs at next visit.   #.  Secondary hypercoagulable state due to atrial fibrillation: CHADSVASC score of 4.  #. LAA thrombus: Seen on pre-ablation CT despite being on Eliquis.  She was changed to Pradaxa and repeat imaging showed resolution of thrombus. -Continue Pradaxa 150 mg twice daily.  #. Aortic stenosis: Read as moderate to severe on echo.  Reviewed by structural team and felt to be more moderate.   -Planned for repeat echo in 6 months and follow-up with Dr. Clifton James.  #. Hypertension - At goal today.  Recommend checking blood pressures 1-2 times per week at home and recording the values.  Recommend bringing these recordings to the primary care physician.  Follow-up with EP APP in 6 months.  Signed, Nobie Putnam, MD

## 2023-12-05 ENCOUNTER — Other Ambulatory Visit: Payer: Self-pay

## 2023-12-05 ENCOUNTER — Encounter: Payer: Self-pay | Admitting: Cardiology

## 2023-12-05 ENCOUNTER — Ambulatory Visit: Attending: Cardiology | Admitting: Cardiology

## 2023-12-05 VITALS — BP 98/60 | HR 61 | Ht 69.0 in | Wt 132.2 lb

## 2023-12-05 DIAGNOSIS — I471 Supraventricular tachycardia, unspecified: Secondary | ICD-10-CM | POA: Diagnosis not present

## 2023-12-05 DIAGNOSIS — I513 Intracardiac thrombosis, not elsewhere classified: Secondary | ICD-10-CM

## 2023-12-05 DIAGNOSIS — I4892 Unspecified atrial flutter: Secondary | ICD-10-CM | POA: Diagnosis not present

## 2023-12-05 DIAGNOSIS — I350 Nonrheumatic aortic (valve) stenosis: Secondary | ICD-10-CM

## 2023-12-05 DIAGNOSIS — I4819 Other persistent atrial fibrillation: Secondary | ICD-10-CM | POA: Diagnosis not present

## 2023-12-05 DIAGNOSIS — D6869 Other thrombophilia: Secondary | ICD-10-CM

## 2023-12-05 DIAGNOSIS — I10 Essential (primary) hypertension: Secondary | ICD-10-CM

## 2023-12-05 LAB — HEPATIC FUNCTION PANEL
ALT: 12 IU/L (ref 0–32)
AST: 24 IU/L (ref 0–40)
Albumin: 4.4 g/dL (ref 3.9–4.9)
Alkaline Phosphatase: 114 IU/L (ref 44–121)
Bilirubin Total: 1.2 mg/dL (ref 0.0–1.2)
Bilirubin, Direct: 0.44 mg/dL — ABNORMAL HIGH (ref 0.00–0.40)
Total Protein: 7.9 g/dL (ref 6.0–8.5)

## 2023-12-05 NOTE — Patient Instructions (Signed)
 Medication Instructions:  Your physician recommends that you continue on your current medications as directed. Please refer to the Current Medication list given to you today.  *If you need a refill on your cardiac medications before your next appointment, please call your pharmacy*  Lab Work: TODAY: Liver test  If you have labs (blood work) drawn today and your tests are completely normal, you will receive your results only by: MyChart Message (if you have MyChart) OR A paper copy in the mail If you have any lab test that is abnormal or we need to change your treatment, we will call you to review the results.   Follow-Up: At [facility], you and your health needs are our priority.  As part of our continuing mission to provide you with exceptional heart care, our providers are all part of one team.  This team includes your primary Cardiologist (physician) and Advanced Practice Providers or APPs (Physician Assistants and Nurse Practitioners) who all work together to provide you with the care you need, when you need it.  Your next appointment:   6 months  Provider:   You will see one of the following Advanced Practice Providers on your designated Care Team:   Francis Dowse, [place] Casimiro Needle "Mardelle Matte" Gracey, PA-C Sherie Don, NP Canary Brim, NP        1st Floor: - Lobby - Registration  - Pharmacy  - Lab - Cafe  2nd Floor: - PV Lab - Diagnostic Testing (echo, CT, nuclear med)  3rd Floor: - Vacant  4th Floor: - TCTS (cardiothoracic surgery) - AFib Clinic - Structural Heart Clinic - Vascular Surgery  - Vascular Ultrasound  5th Floor: - [facility] Cardiology (general and EP) - Clinical Pharmacy for coumadin, hypertension, lipid, weight-loss medications, and med management appointments    Valet parking services will be available as well.

## 2024-04-14 ENCOUNTER — Telehealth: Payer: Self-pay

## 2024-04-14 NOTE — Telephone Encounter (Signed)
 I have attempted to call the patient and both of her emergency contacts, but I was not able to leave a voicemail at any of the listed numbers. Attempting to call the pt to schedule an appointment with Dr. Barbette sometime after her echo on 04/22/24.

## 2024-04-22 ENCOUNTER — Ambulatory Visit (HOSPITAL_COMMUNITY)
Admission: RE | Admit: 2024-04-22 | Discharge: 2024-04-22 | Disposition: A | Discharge status: Home / Self Care | Source: Ambulatory Visit | Attending: Cardiology | Admitting: Cardiology

## 2024-04-22 DIAGNOSIS — I350 Nonrheumatic aortic (valve) stenosis: Secondary | ICD-10-CM | POA: Diagnosis present

## 2024-04-22 LAB — ECHOCARDIOGRAM COMPLETE
AR max vel: 0.82 cm2
AV Area VTI: 0.85 cm2
AV Area mean vel: 0.84 cm2
AV Mean grad: 27 mmHg
AV Peak grad: 47.6 mmHg
Ao pk vel: 3.45 m/s
Area-P 1/2: 3.1 cm2
MV M vel: 4.68 m/s
MV Peak grad: 87.6 mmHg
P 1/2 time: 880 ms
S' Lateral: 2.9 cm

## 2024-04-23 ENCOUNTER — Ambulatory Visit: Payer: Self-pay | Admitting: Cardiovascular Disease

## 2024-04-23 NOTE — Telephone Encounter (Signed)
 I attempted to contact this patient yesterday when she arrived for her echo. We notified the echo staff that we have been trying to get in touch with the pt and asked if they could have her call us  from the office. Per Cherene Ravens, RDMS, the she offered for the pt to use her cell phone but the pt refused and said that she would call us  at home. Cherene gave the patient our number but we have not heard from her. I called the patient again today but was unable to leave a message due to the mailbox being full.

## 2024-04-27 NOTE — Telephone Encounter (Signed)
 Attempted to call patient again to schedule appointment with Dr. Verlin. This is the third attempt at trying to reach the pt, as well as having the echo staff let the pt know that we have been trying to contact her and giving her our phone number to call. We still have not heard from the patient. We will send a letter to the pt's address on file letting her know that we have tried to reach her and to have her call our office to schedule an appointment.

## 2024-06-03 ENCOUNTER — Ambulatory Visit: Admitting: Cardiovascular Disease

## 2024-06-03 NOTE — Progress Notes (Deleted)
 Structural Heart Clinic Note  No chief complaint on file.  History of Present Illness: 68 yo female with history of persistent atrial fibrillation and atrial flutter s/p ablation in October 2022, HTN, hypothyroidism, alcohol  abuse, thoracic aortic aneurysm, tobacco abuse and aortic stenosis who is here today for follow up. She had been followed by [facility] and underwent atrial fib/flutter ablation in October 2022. She has been evaluated in our group by Dr. Kennyth. Cardioversion December 2024. Planning was underway for repeat ablation but CT prior to her ablation showed left atrial appendage thrombus. She was changed to Pradaxa  from Eliquis . Her aortic valve was noted to be calcified on the CT with AV calcium  score of 957. Echo 09/26/23 with LVEF=60-65%. Moderately severe low flow/low gradient aortic stenosis with mean gradient of 21 mmHg, AVA 0.83 cm2, DI 0.26, SVI 32. When I saw her in March 2025 she was feeling well so we elected to follow her aortic stenosis. TEE April 2025 with no evidence of LAA thrombus. She was seen in April 2025 by Dr. Kennyth in the EP clinic and we elected to pause planning for repeat ablation. She was continued on amiodarone . Echo August 2025 with LVEF=60-65%. Moderately severe low flow/low gradient aortic stenosis with mean gradient 27 mmHg, AVA 0.85 cm2, DI 0.24, SVI 36.   She is here today for follow up. The patient denies any chest pain, dyspnea, palpitations, lower extremity edema, orthopnea, PND, dizziness, near syncope or syncope.   She lives in [place] alone. She does not work.   Primary Care Physician: Health, [address] [facility]) Primary Cardiologist: Kennyth Referring Cardiologist: Kennyth  Past Medical History:  Diagnosis Date   Anxiety    Aortic stenosis    Asthma    Depression    Gout    Hypertension    Hypothyroidism    Mental disorder    Thoracic aortic aneurysm    Thyroid  disease     Past Surgical History:  Procedure  Laterality Date   ABDOMINAL HYSTERECTOMY     APPENDECTOMY     CARDIOVERSION N/A 06/16/2017   Procedure: CARDIOVERSION;  Surgeon: Claudene Pacific, MD;  Location: [facility] ENDOSCOPY;  Service: Cardiovascular;  Laterality: N/A;   CARDIOVERSION N/A 08/13/2023   Procedure: CARDIOVERSION;  Surgeon: Delford Maude BROCKS, MD;  Location: [facility] INVASIVE CV LAB;  Service: Cardiovascular;  Laterality: N/A;   TEE WITHOUT CARDIOVERSION N/A 06/16/2017   Procedure: TRANSESOPHAGEAL ECHOCARDIOGRAM (TEE);  Surgeon: Claudene Pacific, MD;  Location: [facility] ENDOSCOPY;  Service: Cardiovascular;  Laterality: N/A;   TRANSESOPHAGEAL ECHOCARDIOGRAM (CATH LAB) N/A 11/18/2023   Procedure: TRANSESOPHAGEAL ECHOCARDIOGRAM;  Surgeon: Jeffrie Oneil BROCKS, MD;  Location: [facility] INVASIVE CV LAB;  Service: Cardiovascular;  Laterality: N/A;    Current Outpatient Medications  Medication Sig Dispense Refill   acetaminophen  (TYLENOL ) 500 MG tablet Take 500 mg by mouth every 6 (six) hours as needed for moderate pain.     albuterol  (VENTOLIN  HFA) 108 (90 Base) MCG/ACT inhaler Inhale 2 puffs into the lungs every 6 (six) hours as needed for wheezing or shortness of breath. (Patient taking differently: Inhale 1 puff into the lungs every 6 (six) hours as needed for wheezing or shortness of breath.) 8 g 2   amiodarone  (PACERONE ) 200 MG tablet Take 1 tablet (200 mg total) by mouth daily. 90 tablet 3   atorvastatin  (LIPITOR) 20 MG tablet TAKE 1 TABLET BY MOUTH EVERY DAY. Must have office visit for refills 30 tablet 0   busPIRone  (BUSPAR ) 10 MG tablet Take 10  mg by mouth 3 (three) times daily.     clonazePAM  (KLONOPIN ) 0.5 MG tablet Take 0.5 mg by mouth 2 (two) times daily as needed for anxiety.     dabigatran  (PRADAXA ) 150 MG CAPS capsule Take 1 capsule (150 mg total) by mouth 2 (two) times daily. 180 capsule 3   digoxin (LANOXIN) 0.125 MG tablet Take 125 mcg by mouth daily.     levothyroxine  (SYNTHROID ) 50 MCG tablet Take 50 mcg by mouth daily before breakfast.      loperamide  (IMODIUM  A-D) 2 MG tablet Take 4 mg by mouth as needed for diarrhea or loose stools.     mirtazapine  (REMERON ) 15 MG tablet Take 15 mg by mouth at bedtime.     Vitamin D, Ergocalciferol, (DRISDOL) 1.25 MG (50000 UNIT) CAPS capsule Take 50,000 Units by mouth every Monday.     No current facility-administered medications for this visit.    Allergies  Allergen Reactions   Hyzaar [Losartan  Potassium-Hctz] Other (See Comments)    Caused patient to feel light-headed and gave her vertigo   Meloxicam Rash    A very bad rash    Social History   Socioeconomic History   Marital status: Single    Spouse name: Not on file   Number of children: 1   Years of education: Not on file   Highest education level: Not on file  Occupational History   Not on file  Tobacco Use   Smoking status: Every Day    Current packs/day: 1.00    Types: Cigarettes   Smokeless tobacco: Never  Vaping Use   Vaping status: Never Used  Substance and Sexual Activity   Alcohol  use: Not Currently    Alcohol /week: 4.0 - 7.0 standard drinks of alcohol     Types: 1 - 3 Glasses of wine, 3 - 4 Cans of beer per week    Comment: 3 times weekly   Drug use: Yes    Types: Marijuana   Sexual activity: Not Currently    Birth control/protection: Surgical  Other Topics Concern   Not on file  Social History Narrative   Not on file   Social Drivers of Health   Financial Resource Strain: Low Risk  (12/06/2022)   Received from [facility]   Overall Financial Resource Strain (CARDIA)    Difficulty of Paying Living Expenses: Not hard at all  Food Insecurity: No Food Insecurity (12/06/2022)   Received from [facility]   Hunger Vital Sign    Within the past 12 months, you worried that your food would run out before you got the money to buy more.: Never true    Within the past 12 months, the food you bought just didn't last and you didn't have money to get more.: Never true  Transportation Needs: No Transportation  Needs (12/06/2022)   Received from [facility] - Transportation    Lack of Transportation (Medical): No    Lack of Transportation (Non-Medical): No  Physical Activity: Unknown (12/06/2022)   Received [facility]   Exercise Vital Sign    On average, how many days per week do you engage in moderate to strenuous exercise (like a brisk walk)?: 0 days    Minutes of Exercise per Session: Not on file  Stress: No Stress Concern Present (04/07/2023)   Received from [facility] - Occupational Stress Questionnaire    Feeling of Stress : Not at all  Social Connections: Socially Isolated (  12/06/2022)   Received from [facility]   Social Network    How would you rate your social network (family, work, friends)?: Little participation, lonely and socially isolated  Intimate Partner Violence: Not At Risk (04/07/2023)   Received from [facility]   HITS    Over the last 12 months how often did your partner physically hurt you?: Never    Over the last 12 months how often did your partner insult you or talk down to you?: Never    Over the last 12 months how often did your partner threaten you with physical harm?: Never    Over the last 12 months how often did your partner scream or curse at you?: Never    Family History  Problem Relation Age of Onset   Hypertension Mother    Arthritis Father    Hypertension Father    Gout Father    Prostate cancer Father     Review of Systems:  As stated in the HPI and otherwise negative.   There were no vitals taken for this visit.  Physical Examination:  General: Well developed, well nourished, NAD  HEENT: OP clear, mucus membranes moist  SKIN: warm, dry. No rashes. Neuro: No focal deficits  Musculoskeletal: Muscle strength 5/5 all ext  Psychiatric: Mood and affect normal  Neck: No JVD, no carotid bruits, no thyromegaly, no lymphadenopathy.  Lungs:Clear bilaterally, no wheezes, rhonci,  crackles Cardiovascular: Regular rate and rhythm. *** Harsh systolic murmur.  Abdomen:Soft. Bowel sounds present. Non-tender.  Extremities: No lower extremity edema. Pulses are 2 + in the bilateral DP/PT.  EKG:  EKG is not *** ordered today. The ekg ordered today demonstrates   Echo August 2025:  1. Left ventricular ejection fraction, by estimation, is 60 to 65%. The left ventricle has normal function. The left ventricle has no regional wall motion abnormalities. Left ventricular diastolic function could not be evaluated.  2. Right ventricular systolic function is normal. The right ventricular size is normal.  3. Left atrial size was severely dilated.  4. Right atrial size was mildly dilated.  5. The mitral valve is normal in structure. Trivial mitral valve regurgitation. No evidence of mitral stenosis.  6. Tricuspid valve regurgitation is mild to moderate.  7. The aortic valve is severely calcified with mild aortic regurgitation. There is moderate to severe aortic valve stenosis. Aortic valve area, by VTI measures 0.85 cm. Aortic valve mean gradient measures 27.0 mmHg. Aortic valve Vmax measures 3.45 m/s.  SVi 36. Dimensionless index 0.24.  8. Aortic dilatation noted. There is dilatation of the ascending aorta, measuring 43 mm.  9. The inferior vena cava is normal in size with greater than 50% respiratory variability, suggesting right atrial pressure of 3 mmHg.  FINDINGS  Left Ventricle: Left ventricular ejection fraction, by estimation, is 60 to 65%. The left ventricle has normal function. The left ventricle has no regional wall motion abnormalities. The left ventricular internal cavity size was normal in size. There is  no left ventricular hypertrophy. Left ventricular diastolic function could not be evaluated due to atrial fibrillation. Left ventricular diastolic function could not be evaluated.  Right Ventricle: The right ventricular size is normal. No increase in right  ventricular wall thickness. Right ventricular systolic function is normal.  Left Atrium: Left atrial size was severely dilated.  Right Atrium: Right atrial size was mildly dilated.  Pericardium: There is no evidence of pericardial effusion.  Mitral Valve: The mitral valve is normal in structure. Trivial mitral valve regurgitation.  No evidence of mitral valve stenosis.  Tricuspid Valve: The tricuspid valve is normal in structure. Tricuspid valve regurgitation is mild to moderate. No evidence of tricuspid stenosis.  Aortic Valve: The aortic valve is calcified. Aortic valve regurgitation is mild. Aortic regurgitation PHT measures 880 msec. Moderate to severe aortic stenosis is present. Aortic valve mean gradient measures 27.0 mmHg. Aortic valve peak gradient measures  47.6 mmHg. Aortic valve area, by VTI measures 0.85 cm.  Pulmonic Valve: The pulmonic valve was normal in structure. Pulmonic valve regurgitation is mild. No evidence of pulmonic stenosis.  Aorta: Aortic dilatation noted. There is dilatation of the ascending aorta, measuring 43 mm.  Venous: The inferior vena cava is normal in size with greater than 50% respiratory variability, suggesting right atrial pressure of 3 mmHg.  IAS/Shunts: No atrial level shunt detected by color flow Doppler.    LEFT VENTRICLE PLAX 2D LVIDd:         4.30 cm   Diastology LVIDs:         2.90 cm   LV e' medial:    6.20 cm/s LV PW:         1.10 cm   LV E/e' medial:  12.7 LV IVS:        1.00 cm   LV e' lateral:   8.59 cm/s LVOT diam:     2.10 cm   LV E/e' lateral: 9.2 LV SV:         69 LV SV Index:   40 LVOT Area:     3.46 cm    RIGHT VENTRICLE             IVC RV Basal diam:  3.20 cm     IVC diam: 1.20 cm RV Mid diam:    2.60 cm RV S prime:     18.40 cm/s TAPSE (M-mode): 2.0 cm RVSP:           40.0 mmHg  LEFT ATRIUM           Index        RIGHT ATRIUM           Index LA diam:      4.10 cm 2.37 cm/m   RA Pressure: 3.00 mmHg LA  Vol (A4C): 73.2 ml 42.22 ml/m  RA Area:     17.40 cm                                    RA Volume:   47.30 ml  27.30 ml/m  AORTIC VALVE                     PULMONIC VALVE AV Area (Vmax):    0.82 cm      PR End Diast Vel: 8.53 msec AV Area (Vmean):   0.84 cm AV Area (VTI):     0.85 cm AV Vmax:           345.00 cm/s AV Vmean:          223.333 cm/s AV VTI:            0.805 m AV Peak Grad:      47.6 mmHg AV Mean Grad:      27.0 mmHg LVOT Vmax:         82.00 cm/s LVOT Vmean:        54.200 cm/s LVOT VTI:  0.198 m LVOT/AV VTI ratio: 0.25 AI PHT:            880 msec   AORTA Ao Root diam: 3.10 cm Ao Asc diam:  4.30 cm  MITRAL VALVE               TRICUSPID VALVE MV Area (PHT): 3.10 cm    TR Peak grad:   37.0 mmHg MV Decel Time: 245 msec    TR Vmax:        304.00 cm/s MR Peak grad: 87.6 mmHg    Estimated RAP:  3.00 mmHg MR Vmax:      468.00 cm/s  RVSP:           40.0 mmHg MV E velocity: 78.60 cm/s MV A velocity: 26.70 cm/s  SHUNTS MV E/A ratio:  2.94        Systemic VTI:  0.20 m                            Systemic Diam: 2.10 cm     Recent Labs: 08/04/2023: TSH 1.800 09/25/2023: Platelets 182 11/18/2023: BUN 10; Creatinine, Ser 1.00; Hemoglobin 16.0; Potassium 4.4; Sodium 138 12/05/2023: ALT 12    Wt Readings from Last 3 Encounters:  12/05/23 132 lb 3.2 oz (60 kg)  11/18/23 134 lb (60.8 kg)  10/30/23 136 lb 6.4 oz (61.9 kg)    Assessment and Plan:   1. Severe Aortic Valve Stenosis: She has severe low flow/low gradient aortic stenosis. NYHA class ***. I have personally reviewed the echo images today. The aortic valve leaflets are thickened and calcified.  I think *** would benefit from AVR. Given advanced age, *** is not a good candidate for conventional AVR by surgical approach. I think *** may be a good candidate for TAVR.   I have reviewed the natural history of aortic stenosis with the patient and their family members  who are present today. We have discussed the  limitations of medical therapy and the poor prognosis associated with symptomatic aortic stenosis. We have reviewed potential treatment options, including palliative medical therapy, conventional surgical aortic valve replacement, and transcatheter aortic valve replacement. We discussed treatment options in the context of the patient's specific comorbid medical conditions.    *** would like to proceed with planning for TAVR. I will arrange a right and left heart catheterization at [facility] ***. Risks and benefits of the cath procedure and the valve procedure are reviewed with the patient. After the cath, *** will have a cardiac CT, CTA of the chest/abdomen and pelvis and will then be referred to see one of the CT surgeons on our TAVR team.       Labs/ tests ordered today include:   No orders of the defined types were placed in this encounter.  Disposition:   F/U with me in 6 months  Signed, Lonni Cash, MD, [facility] 06/03/2024 6:50 AM    [facility] [address] [place], [place], [place]  [address] Phone: [phone]; Fax: [phone]

## 2024-06-17 ENCOUNTER — Encounter: Payer: Self-pay | Admitting: Cardiovascular Disease

## 2024-06-17 ENCOUNTER — Ambulatory Visit: Attending: Cardiovascular Disease | Admitting: Cardiovascular Disease

## 2024-06-17 VITALS — BP 110/72 | HR 85 | Ht 69.0 in | Wt 119.2 lb

## 2024-06-17 DIAGNOSIS — I350 Nonrheumatic aortic (valve) stenosis: Secondary | ICD-10-CM

## 2024-06-17 NOTE — Patient Instructions (Signed)
 Medication Instructions:  Your physician recommends that you continue on your current medications as directed. Please refer to the Current Medication list given to you today.  *If you need a refill on your cardiac medications before your next appointment, please call your pharmacy*  Lab Work: None  If you have labs (blood work) drawn today and your tests are completely normal, you will receive your results only by: MyChart Message (if you have MyChart) OR A paper copy in the mail If you have any lab test that is abnormal or we need to change your treatment, we will call you to review the results.  Testing/Procedures: Your physician has requested that you have an echocardiogram in February 2026.  Echocardiography is a painless test that uses sound waves to create images of your heart. It provides your doctor with information about the size and shape of your heart and how well your heart's chambers and valves are working. This procedure takes approximately one hour. There are no restrictions for this procedure. Please do NOT wear cologne, perfume, aftershave, or lotions (deodorant is allowed). Please arrive 15 minutes prior to your appointment time.  Please note: We ask at that you not bring children with you during ultrasound (echo/ vascular) testing. Due to room size and safety concerns, children are not allowed in the ultrasound rooms during exams. Our front office staff cannot provide observation of children in our lobby area while testing is being conducted. An adult accompanying a patient to their appointment will only be allowed in the ultrasound room at the discretion of the ultrasound technician under special circumstances. We apologize for any inconvenience.   Follow-Up: At [facility], you and your health needs are our priority.  As part of our continuing mission to provide you with exceptional heart care, our providers are all part of one team.  This team includes your  primary Cardiologist (physician) and Advanced Practice Providers or APPs (Physician Assistants and Nurse Practitioners) who all work together to provide you with the care you need, when you need it.  Your next appointment:   6 month(s)  Provider:   Lonni Cash, MD    We recommend signing up for the patient portal called MyChart.  Sign up information is provided on this After Visit Summary.  MyChart is used to connect with patients for Virtual Visits (Telemedicine).  Patients are able to view lab/test results, encounter notes, upcoming appointments, etc.  Non-urgent messages can be sent to your provider as well.   To learn more about what you can do with MyChart, go to [email or web address].

## 2024-06-17 NOTE — Progress Notes (Signed)
 [facility] Clinic Note  Chief Complaint  Patient presents with   Follow-up    Aortic stenosis    History of Present Illness: 68 yo female with history of persistent atrial fibrillation and atrial flutter s/p ablation in October 2022, HTN, hypothyroidism, alcohol  abuse, thoracic aortic aneurysm, tobacco abuse and aortic stenosis who is here today for follow up. She had been followed by [facility] and underwent atrial fib/flutter ablation in October 2022. She has been evaluated in our group by Dr. Kennyth. Cardioversion December 2024. Planning was underway for repeat ablation but CT prior to her ablation showed left atrial appendage thrombus. She was changed to Pradaxa  from Eliquis . Her aortic valve was noted to be calcified on the CT with AV calcium  score of 957. Echo 09/26/23 with LVEF=60-65%. Severe low flow/low gradient aortic stenosis with mean gradient of 21 mmHg, AVA 0.83 cm2, DI 0.26, SVI 32. When I saw her in March 2025 she was feeling well so we elected to follow her aortic stenosis. TEE April 2025 with no evidence of LAA thrombus. She was seen in April 2025 by Dr. Kennyth in the EP clinic and we elected to pause planning for repeat ablation. She was continued on amiodarone . Echo August 2025 with LVEF=60-65%. Severe low flow/low gradient aortic stenosis with mean gradient 27 mmHg, AVA 0.85 cm2, DI 0.24, SVI 36.   She is here today for follow up. The patient denies any chest pain, dyspnea, palpitations, lower extremity edema, orthopnea, PND, dizziness, near syncope or syncope. She has been walking every day.   She lives in [place] alone. She does not work.   Primary Care Physician: [facility], [address] [facility]) Primary Cardiologist: Kennyth Referring Cardiologist: Kennyth  Past Medical History:  Diagnosis Date   Anxiety    Aortic stenosis    Asthma    Depression    Gout    Hypertension    Hypothyroidism    Mental disorder    Thoracic aortic aneurysm     Thyroid  disease     Past Surgical History:  Procedure Laterality Date   ABDOMINAL HYSTERECTOMY     APPENDECTOMY     CARDIOVERSION N/A 06/16/2017   Procedure: CARDIOVERSION;  Surgeon: Claudene Pacific, MD;  Location: [facility] ENDOSCOPY;  Service: Cardiovascular;  Laterality: N/A;   CARDIOVERSION N/A 08/13/2023   Procedure: CARDIOVERSION;  Surgeon: Delford Maude BROCKS, MD;  Location: [facility] INVASIVE CV LAB;  Service: Cardiovascular;  Laterality: N/A;   TEE WITHOUT CARDIOVERSION N/A 06/16/2017   Procedure: TRANSESOPHAGEAL ECHOCARDIOGRAM (TEE);  Surgeon: Claudene Pacific, MD;  Location: [facility] ENDOSCOPY;  Service: Cardiovascular;  Laterality: N/A;   TRANSESOPHAGEAL ECHOCARDIOGRAM (CATH LAB) N/A 11/18/2023   Procedure: TRANSESOPHAGEAL ECHOCARDIOGRAM;  Surgeon: Jeffrie Oneil BROCKS, MD;  Location: [facility] INVASIVE CV LAB;  Service: Cardiovascular;  Laterality: N/A;    Current Outpatient Medications  Medication Sig Dispense Refill   acetaminophen  (TYLENOL ) 500 MG tablet Take 500 mg by mouth every 6 (six) hours as needed for moderate pain.     albuterol  (VENTOLIN  HFA) 108 (90 Base) MCG/ACT inhaler Inhale 2 puffs into the lungs every 6 (six) hours as needed for wheezing or shortness of breath. (Patient taking differently: Inhale 1 puff into the lungs every 6 (six) hours as needed for wheezing or shortness of breath.) 8 g 2   amiodarone  (PACERONE ) 200 MG tablet Take 1 tablet (200 mg total) by mouth daily. 90 tablet 3   atorvastatin  (LIPITOR) 20 MG tablet TAKE 1 TABLET BY MOUTH EVERY DAY. Must have office  visit for refills 30 tablet 0   busPIRone  (BUSPAR ) 10 MG tablet Take 10 mg by mouth 3 (three) times daily.     clonazePAM  (KLONOPIN ) 0.5 MG tablet Take 0.5 mg by mouth 2 (two) times daily as needed for anxiety.     dabigatran  (PRADAXA ) 150 MG CAPS capsule Take 1 capsule (150 mg total) by mouth 2 (two) times daily. 180 capsule 3   digoxin (LANOXIN) 0.125 MG tablet Take 125 mcg by mouth daily.     levothyroxine  (SYNTHROID ) 50 MCG tablet  Take 50 mcg by mouth daily before breakfast.     loperamide  (IMODIUM  A-D) 2 MG tablet Take 4 mg by mouth as needed for diarrhea or loose stools.     mirtazapine  (REMERON ) 15 MG tablet Take 15 mg by mouth at bedtime.     Vitamin D, Ergocalciferol, (DRISDOL) 1.25 MG (50000 UNIT) CAPS capsule Take 50,000 Units by mouth every Monday.     No current facility-administered medications for this visit.    Allergies  Allergen Reactions   Hyzaar [Losartan  Potassium-Hctz] Other (See Comments)    Caused patient to feel light-headed and gave her vertigo   Meloxicam Rash    A very bad rash    Social History   Socioeconomic History   Marital status: Single    Spouse name: Not on file   Number of children: 1   Years of education: Not on file   Highest education level: Not on file  Occupational History   Not on file  Tobacco Use   Smoking status: Every Day    Current packs/day: 1.00    Types: Cigarettes   Smokeless tobacco: Never  Vaping Use   Vaping status: Never Used  Substance and Sexual Activity   Alcohol  use: Not Currently    Alcohol /week: 4.0 - 7.0 standard drinks of alcohol     Types: 1 - 3 Glasses of wine, 3 - 4 Cans of beer per week    Comment: 3 times weekly   Drug use: Yes    Types: Marijuana   Sexual activity: Not Currently    Birth control/protection: Surgical  Other Topics Concern   Not on file  Social History Narrative   Not on file   Social Drivers of Health   Financial Resource Strain: Low Risk  (12/06/2022)   Received from [facility]   Overall Financial Resource Strain (CARDIA)    Difficulty of Paying Living Expenses: Not hard at all  Food Insecurity: No Food Insecurity (12/06/2022)   Received from [facility]   Hunger Vital Sign    Within the past 12 months, you worried that your food would run out before you got the money to buy more.: Never true    Within the past 12 months, the food you bought just didn't last and you didn't have money to get more.:  Never true  Transportation Needs: No Transportation Needs (12/06/2022)   Received from [facility] - Transportation    Lack of Transportation (Medical): No    Lack of Transportation (Non-Medical): No  Physical Activity: Unknown (12/06/2022)   [facility]   Exercise Vital Sign    On average, how many days per week do you engage in moderate to strenuous exercise (like a brisk walk)?: 0 days    Minutes of Exercise per Session: Not on file  Stress: No Stress Concern Present (04/07/2023)   Received from [facility] - Occupational Stress Questionnaire  Feeling of Stress : Not at all  Social Connections: Socially Isolated (12/06/2022)   Received from [facility]   Social Network    How would you rate your social network (family, work, friends)?: Little participation, lonely and socially isolated  Intimate Partner Violence: Not At Risk (04/07/2023)   Received from [facility]   HITS    Over the last 12 months how often did your partner physically hurt you?: Never    Over the last 12 months how often did your partner insult you or talk down to you?: Never    Over the last 12 months how often did your partner threaten you with physical harm?: Never    Over the last 12 months how often did your partner scream or curse at you?: Never    Family History  Problem Relation Age of Onset   Hypertension Mother    Arthritis Father    Hypertension Father    Gout Father    Prostate cancer Father     Review of Systems:  As stated in the HPI and otherwise negative.   BP 110/72   Pulse 85   Ht 5' 9 (1.753 m)   Wt 119 lb 3.2 oz (54.1 kg)   SpO2 98%   BMI 17.60 kg/m   Physical Examination:  General: Well developed, well nourished, NAD  HEENT: OP clear, mucus membranes moist  SKIN: warm, dry. No rashes. Neuro: No focal deficits  Musculoskeletal: Muscle strength 5/5 all ext  Psychiatric: Mood and affect normal  Neck:  No JVD, no carotid bruits, no thyromegaly, no lymphadenopathy.  Lungs:Clear bilaterally, no wheezes, rhonci, crackles Cardiovascular: Regular rate and rhythm. Systolic murmur.  Abdomen:Soft. Bowel sounds present. Non-tender.  Extremities: No lower extremity edema. Pulses are 2 + in the bilateral DP/PT.  EKG:  EKG is not ordered today. The ekg ordered today demonstrates   Echo August 2025:  1. Left ventricular ejection fraction, by estimation, is 60 to 65%. The left ventricle has normal function. The left ventricle has no regional wall motion abnormalities. Left ventricular diastolic function could not be evaluated.  2. Right ventricular systolic function is normal. The right ventricular size is normal.  3. Left atrial size was severely dilated.  4. Right atrial size was mildly dilated.  5. The mitral valve is normal in structure. Trivial mitral valve regurgitation. No evidence of mitral stenosis.  6. Tricuspid valve regurgitation is mild to moderate.  7. The aortic valve is severely calcified with mild aortic regurgitation. There is moderate to severe aortic valve stenosis. Aortic valve area, by VTI measures 0.85 cm. Aortic valve mean gradient measures 27.0 mmHg. Aortic valve Vmax measures 3.45 m/s.  SVi 36. Dimensionless index 0.24.  8. Aortic dilatation noted. There is dilatation of the ascending aorta, measuring 43 mm.  9. The inferior vena cava is normal in size with greater than 50% respiratory variability, suggesting right atrial pressure of 3 mmHg.  FINDINGS  Left Ventricle: Left ventricular ejection fraction, by estimation, is 60 to 65%. The left ventricle has normal function. The left ventricle has no regional wall motion abnormalities. The left ventricular internal cavity size was normal in size. There is  no left ventricular hypertrophy. Left ventricular diastolic function could not be evaluated due to atrial fibrillation. Left ventricular diastolic function could  not be evaluated.  Right Ventricle: The right ventricular size is normal. No increase in right ventricular wall thickness. Right ventricular systolic function is normal.  Left Atrium: Left atrial size was severely  dilated.  Right Atrium: Right atrial size was mildly dilated.  Pericardium: There is no evidence of pericardial effusion.  Mitral Valve: The mitral valve is normal in structure. Trivial mitral valve regurgitation. No evidence of mitral valve stenosis.  Tricuspid Valve: The tricuspid valve is normal in structure. Tricuspid valve regurgitation is mild to moderate. No evidence of tricuspid stenosis.  Aortic Valve: The aortic valve is calcified. Aortic valve regurgitation is mild. Aortic regurgitation PHT measures 880 msec. Moderate to severe aortic stenosis is present. Aortic valve mean gradient measures 27.0 mmHg. Aortic valve peak gradient measures  47.6 mmHg. Aortic valve area, by VTI measures 0.85 cm.  Pulmonic Valve: The pulmonic valve was normal in structure. Pulmonic valve regurgitation is mild. No evidence of pulmonic stenosis.  Aorta: Aortic dilatation noted. There is dilatation of the ascending aorta, measuring 43 mm.  Venous: The inferior vena cava is normal in size with greater than 50% respiratory variability, suggesting right atrial pressure of 3 mmHg.  IAS/Shunts: No atrial level shunt detected by color flow Doppler.    LEFT VENTRICLE PLAX 2D LVIDd:         4.30 cm   Diastology LVIDs:         2.90 cm   LV e' medial:    6.20 cm/s LV PW:         1.10 cm   LV E/e' medial:  12.7 LV IVS:        1.00 cm   LV e' lateral:   8.59 cm/s LVOT diam:     2.10 cm   LV E/e' lateral: 9.2 LV SV:         69 LV SV Index:   40 LVOT Area:     3.46 cm    RIGHT VENTRICLE             IVC RV Basal diam:  3.20 cm     IVC diam: 1.20 cm RV Mid diam:    2.60 cm RV S prime:     18.40 cm/s TAPSE (M-mode): 2.0 cm RVSP:           40.0 mmHg  LEFT ATRIUM           Index         RIGHT ATRIUM           Index LA diam:      4.10 cm 2.37 cm/m   RA Pressure: 3.00 mmHg LA Vol (A4C): 73.2 ml 42.22 ml/m  RA Area:     17.40 cm                                    RA Volume:   47.30 ml  27.30 ml/m  AORTIC VALVE                     PULMONIC VALVE AV Area (Vmax):    0.82 cm      PR End Diast Vel: 8.53 msec AV Area (Vmean):   0.84 cm AV Area (VTI):     0.85 cm AV Vmax:           345.00 cm/s AV Vmean:          223.333 cm/s AV VTI:            0.805 m AV Peak Grad:      47.6 mmHg AV Mean Grad:      27.0 mmHg LVOT  Vmax:         82.00 cm/s LVOT Vmean:        54.200 cm/s LVOT VTI:          0.198 m LVOT/AV VTI ratio: 0.25 AI PHT:            880 msec   AORTA Ao Root diam: 3.10 cm Ao Asc diam:  4.30 cm  MITRAL VALVE               TRICUSPID VALVE MV Area (PHT): 3.10 cm    TR Peak grad:   37.0 mmHg MV Decel Time: 245 msec    TR Vmax:        304.00 cm/s MR Peak grad: 87.6 mmHg    Estimated RAP:  3.00 mmHg MR Vmax:      468.00 cm/s  RVSP:           40.0 mmHg MV E velocity: 78.60 cm/s MV A velocity: 26.70 cm/s  SHUNTS MV E/A ratio:  2.94        Systemic VTI:  0.20 m                            Systemic Diam: 2.10 cm     Recent Labs: 08/04/2023: TSH 1.800 09/25/2023: Platelets 182 11/18/2023: BUN 10; Creatinine, Ser 1.00; Hemoglobin 16.0; Potassium 4.4; Sodium 138 12/05/2023: ALT 12    Wt Readings from Last 3 Encounters:  06/17/24 119 lb 3.2 oz (54.1 kg)  12/05/23 132 lb 3.2 oz (60 kg)  11/18/23 134 lb (60.8 kg)    Assessment and Plan:   1. Severe Aortic Valve Stenosis: She has moderate to severe low flow/low gradient aortic stenosis. NYHA class 1. I have personally reviewed the echo images today. The aortic valve leaflets are thickened and calcified. She is asymptomatic. When she becomes symptomatic, she will most likely be a candidate for surgical AVR or TAVR    I have reviewed the natural history of aortic stenosis with the patient and their family members  who  are present today. We have discussed the limitations of medical therapy and the poor prognosis associated with symptomatic aortic stenosis. We have reviewed potential treatment options, including palliative medical therapy, conventional surgical aortic valve replacement, and transcatheter aortic valve replacement. We discussed treatment options in the context of the patient's specific comorbid medical conditions.   I will plan to repeat her echo in February 2026.   30 minutes spent in chart review, echo image review, examination, discussion regarding disease process and planning, note formulation.      Labs/ tests ordered today include:   Orders Placed This Encounter  Procedures   ECHOCARDIOGRAM COMPLETE   Disposition:   F/U with me in 6 months  Signed, Lonni Cash, MD, [facility] 06/17/2024 2:11 PM    [facility] [address] [place], [place], [place]  [address] Phone: [phone]; Fax: [phone]

## 2024-06-22 ENCOUNTER — Other Ambulatory Visit: Payer: Self-pay | Admitting: Cardiology

## 2024-06-22 NOTE — Telephone Encounter (Signed)
 Prescription refill request for pradaxa  received.  Indication:afib Last office visit:10/25 Weight:54.1  kg Age:68 Scr:1.00  3/25 CrCl:45.99  ml/min  Prescription refilled

## 2024-07-09 ENCOUNTER — Telehealth: Payer: Self-pay

## 2024-07-09 NOTE — Telephone Encounter (Signed)
 The patient called to let us  know that she is aware of her appointment on 10/06/24 with Dr. Verlin and that she plans to be there.

## 2024-09-29 ENCOUNTER — Emergency Department (HOSPITAL_COMMUNITY)
Admission: EM | Admit: 2024-09-29 | Discharge: 2024-09-30 | Disposition: A | Attending: Emergency Medicine | Admitting: Emergency Medicine

## 2024-09-29 ENCOUNTER — Encounter (HOSPITAL_COMMUNITY): Payer: Self-pay | Admitting: Emergency Medicine

## 2024-09-29 ENCOUNTER — Other Ambulatory Visit: Payer: Self-pay

## 2024-09-29 ENCOUNTER — Emergency Department (HOSPITAL_COMMUNITY)

## 2024-09-29 DIAGNOSIS — I10 Essential (primary) hypertension: Secondary | ICD-10-CM | POA: Insufficient documentation

## 2024-09-29 DIAGNOSIS — Z79899 Other long term (current) drug therapy: Secondary | ICD-10-CM | POA: Insufficient documentation

## 2024-09-29 DIAGNOSIS — W000XXA Fall on same level due to ice and snow, initial encounter: Secondary | ICD-10-CM | POA: Insufficient documentation

## 2024-09-29 DIAGNOSIS — J45909 Unspecified asthma, uncomplicated: Secondary | ICD-10-CM | POA: Insufficient documentation

## 2024-09-29 DIAGNOSIS — E039 Hypothyroidism, unspecified: Secondary | ICD-10-CM | POA: Insufficient documentation

## 2024-09-29 DIAGNOSIS — M25551 Pain in right hip: Secondary | ICD-10-CM | POA: Insufficient documentation

## 2024-09-29 DIAGNOSIS — S2241XA Multiple fractures of ribs, right side, initial encounter for closed fracture: Secondary | ICD-10-CM | POA: Insufficient documentation

## 2024-09-29 NOTE — ED Triage Notes (Signed)
 Pt c/o pain that starts in her right hip that radiates down her right leg. Denies hx of sciatica. Recent fall with diagnosed ribs fractures, was not having leg pain then.

## 2024-09-30 ENCOUNTER — Ambulatory Visit (HOSPITAL_COMMUNITY)

## 2024-09-30 ENCOUNTER — Encounter (HOSPITAL_COMMUNITY): Payer: Self-pay

## 2024-09-30 ENCOUNTER — Emergency Department (HOSPITAL_COMMUNITY)

## 2024-09-30 ENCOUNTER — Other Ambulatory Visit (HOSPITAL_COMMUNITY): Payer: Self-pay

## 2024-09-30 LAB — CBC WITH DIFFERENTIAL/PLATELET
Abs Immature Granulocytes: 0.06 10*3/uL (ref 0.00–0.07)
Basophils Absolute: 0 10*3/uL (ref 0.0–0.1)
Basophils Relative: 1 %
Eosinophils Absolute: 0.1 10*3/uL (ref 0.0–0.5)
Eosinophils Relative: 2 %
HCT: 34.3 % — ABNORMAL LOW (ref 36.0–46.0)
Hemoglobin: 11.7 g/dL — ABNORMAL LOW (ref 12.0–15.0)
Immature Granulocytes: 1 %
Lymphocytes Relative: 22 %
Lymphs Abs: 1.2 10*3/uL (ref 0.7–4.0)
MCH: 34.6 pg — ABNORMAL HIGH (ref 26.0–34.0)
MCHC: 34.1 g/dL (ref 30.0–36.0)
MCV: 101.5 fL — ABNORMAL HIGH (ref 80.0–100.0)
Monocytes Absolute: 0.5 10*3/uL (ref 0.1–1.0)
Monocytes Relative: 9 %
Neutro Abs: 3.7 10*3/uL (ref 1.7–7.7)
Neutrophils Relative %: 65 %
Platelets: 182 10*3/uL (ref 150–400)
RBC: 3.38 MIL/uL — ABNORMAL LOW (ref 3.87–5.11)
RDW: 13.4 % (ref 11.5–15.5)
WBC: 5.6 10*3/uL (ref 4.0–10.5)
nRBC: 0 % (ref 0.0–0.2)

## 2024-09-30 LAB — I-STAT CHEM 8, ED
BUN: 16 mg/dL (ref 8–23)
Calcium, Ion: 1.09 mmol/L — ABNORMAL LOW (ref 1.15–1.40)
Chloride: 100 mmol/L (ref 98–111)
Creatinine, Ser: 1.2 mg/dL — ABNORMAL HIGH (ref 0.44–1.00)
Glucose, Bld: 87 mg/dL (ref 70–99)
HCT: 35 % — ABNORMAL LOW (ref 36.0–46.0)
Hemoglobin: 11.9 g/dL — ABNORMAL LOW (ref 12.0–15.0)
Potassium: 4.7 mmol/L (ref 3.5–5.1)
Sodium: 137 mmol/L (ref 135–145)
TCO2: 26 mmol/L (ref 22–32)

## 2024-09-30 MED ORDER — HYDROCODONE-ACETAMINOPHEN 5-325 MG PO TABS: 1.0000 | ORAL_TABLET | ORAL | 0 refills | Status: AC | PRN

## 2024-09-30 MED ORDER — HYDROCODONE-ACETAMINOPHEN 5-325 MG PO TABS
1.0000 | ORAL_TABLET | ORAL | 0 refills | Status: AC | PRN
  Filled 2024-09-30: qty 20, 4d supply, fill #0

## 2024-09-30 MED ORDER — FENTANYL CITRATE (PF) 50 MCG/ML IJ SOSY
50.0000 ug | PREFILLED_SYRINGE | Freq: Once | INTRAMUSCULAR | Status: AC
  Administered 2024-09-30: 50 ug via INTRAVENOUS
  Filled 2024-09-30: qty 1

## 2024-09-30 MED ORDER — IOHEXOL 300 MG/ML  SOLN
100.0000 mL | Freq: Once | INTRAMUSCULAR | Status: AC | PRN
  Administered 2024-09-30: 100 mL via INTRAVENOUS

## 2024-09-30 NOTE — Discharge Instructions (Signed)
 As we discussed, numerous right rib fractures and large hematoma on your right hip. Take the prescribed medication as directed. Do recommend to use incentive spirometer a few times a day to keep lungs open and strong, prevents pneumonia. Follow-up with your primary care doctor. Return to the ED for new or worsening symptoms.

## 2024-09-30 NOTE — ED Notes (Signed)
 Patient transported to CT

## 2024-09-30 NOTE — ED Provider Notes (Signed)
 " [place] EMERGENCY DEPARTMENT AT [facility] Provider Note   CSN: [id number] Arrival date & time: 09/29/24  2123     Patient presents with: Leg Pain and Fall   Anita Russell is a 69 y.o. female.   The history is provided by the patient and medical records.  Leg Pain Fall   69 year old female with history of gout, hypothyroidism, hypertension, depression, asthma, presenting to the ED after a fall.  This occurred 6 days ago where she slipped on some ice.  Subsequently had another fall in the bathroom that same day.  There was no head injury or loss of consciousness.  Patient reports she was seen at [facility] and had x-rays done which showed a few possible rib fractures on her right side.  States since this time she has had worsening pain into her flank and into the right hip.  She reports extensive bruising to her back, seems to be getting worse daily.  Now feeling like she can barely get up and walk without using a cane which is abnormal for her.  She denies any leg numbness or weakness, no incontinence.  She does have a history of sciatica.  Prior to Admission medications  Medication Sig Start Date End Date Taking? Authorizing Provider  acetaminophen  (TYLENOL ) 500 MG tablet Take 500 mg by mouth every 6 (six) hours as needed for moderate pain.    [provider]  albuterol  (VENTOLIN  HFA) 108 (90 Base) MCG/ACT inhaler Inhale 2 puffs into the lungs every 6 (six) hours as needed for wheezing or shortness of breath. Patient taking differently: Inhale 1 puff into the lungs every 6 (six) hours as needed for wheezing or shortness of breath. 12/30/20   Lue Elsie BROCKS, MD  amiodarone  (PACERONE ) 200 MG tablet Take 1 tablet (200 mg total) by mouth daily. 11/05/23   Kennyth Chew, MD  atorvastatin  (LIPITOR) 20 MG tablet TAKE 1 TABLET BY MOUTH EVERY DAY. Must have office visit for refills 11/07/20   Newlin, [name], MD  busPIRone  (BUSPAR ) 10 MG tablet Take 10 mg by mouth 3 (three)  times daily.    [provider]  clonazePAM  (KLONOPIN ) 0.5 MG tablet Take 0.5 mg by mouth 2 (two) times daily as needed for anxiety. 11/06/20   [provider]  dabigatran  (PRADAXA ) 150 MG CAPS capsule TAKE 1 CAPSULE(150 MG) BY MOUTH TWICE DAILY 06/22/24   Kennyth Chew, MD  digoxin (LANOXIN) 0.125 MG tablet Take 125 mcg by mouth daily. 10/26/23   [provider]  levothyroxine  (SYNTHROID ) 50 MCG tablet Take 50 mcg by mouth daily before breakfast.    [provider]  loperamide  (IMODIUM  A-D) 2 MG tablet Take 4 mg by mouth as needed for diarrhea or loose stools.    [provider]  mirtazapine  (REMERON ) 15 MG tablet Take 15 mg by mouth at bedtime.    [provider]  Vitamin D, Ergocalciferol, (DRISDOL) 1.25 MG (50000 UNIT) CAPS capsule Take 50,000 Units by mouth every Monday. 06/06/23   [provider]    Allergies: Hyzaar [losartan  potassium-hctz] and Meloxicam    Review of Systems  Musculoskeletal:  Positive for arthralgias.  All other systems reviewed and are negative.   Updated Vital Signs BP (!) 147/94 (BP Location: Right Arm)   Pulse 68   Temp 97.8 F (36.6 C)   Resp 16   Ht 5' 9 (1.753 m)   Wt 58.5 kg   SpO2 100%   BMI 19.05 kg/m   Physical Exam  Vitals and nursing note reviewed.  Constitutional:      Appearance: She is well-developed.  HENT:     Head: Normocephalic and atraumatic.  Eyes:     Conjunctiva/sclera: Conjunctivae normal.     Pupils: Pupils are equal, round, and reactive to light.  Cardiovascular:     Rate and Rhythm: Normal rate and regular rhythm.     Heart sounds: Normal heart sounds.  Pulmonary:     Effort: Pulmonary effort is normal. No respiratory distress.     Breath sounds: Normal breath sounds. No rhonchi.  Abdominal:     General: Bowel sounds are normal.     Palpations: Abdomen is soft.     Comments: Extensive bruising noted to right flank and right hip, appears purple and in  various stages of healing, locally tender without acute deformity  Musculoskeletal:        General: Normal range of motion.     Cervical back: Normal range of motion.     Comments: Right hip without acute deformity, bruising extending to lateral iliac area from right flank; no leg shortening, able to ambulate in ED  Skin:    General: Skin is warm and dry.  Neurological:     Mental Status: She is alert and oriented to person, place, and time.     (all labs ordered are listed, but only abnormal results are displayed) Labs Reviewed  CBC WITH DIFFERENTIAL/PLATELET - Abnormal; Notable for the following components:      Result Value   RBC 3.38 (*)    Hemoglobin 11.7 (*)    HCT 34.3 (*)    MCV 101.5 (*)    MCH 34.6 (*)    All other components within normal limits  I-STAT CHEM 8, ED - Abnormal; Notable for the following components:   Creatinine, Ser 1.20 (*)    Calcium , Ion 1.09 (*)    Hemoglobin 11.9 (*)    HCT 35.0 (*)    All other components within normal limits    EKG: None  Radiology: CT L-SPINE NO CHARGE Result Date: 09/30/2024 EXAM: CT OF THE LUMBAR SPINE WITHOUT CONTRAST 09/30/2024 01:27:48 AM TECHNIQUE: CT of the lumbar spine was performed without the administration of intravenous contrast. Multiplanar reformatted images are provided for review. Automated exposure control, iterative reconstruction, and/or weight based adjustment of the mA/kV was utilized to reduce the radiation dose to as low as reasonably achievable. COMPARISON: None available. CLINICAL HISTORY: FINDINGS: BONES AND ALIGNMENT: Normal vertebral body heights, except for L4. Minimally displaced right 12th rib fracture. Chronic superior endplate compression fracture of L4 with 25% vertebral body height loss anteriorly. 3 mm of retropulsion of the superior endplate. No acute fracture or traumatic malalignment. Normal alignment. DEGENERATIVE CHANGES: No severe spinal canal narrowing. SOFT TISSUES: Findings in the abdomen  and pelvis reported separately. IMPRESSION: 1. No acute fracture or traumatic malalignment in the lumbar spine. 2. Minimally displaced right 12th rib fracture. Electronically signed by: Norman Gatlin MD 09/30/2024 01:45 AM EST RP Workstation: [id number]   CT CHEST ABDOMEN PELVIS W CONTRAST Result Date: 09/30/2024 EXAM: CT CHEST, ABDOMEN AND PELVIS WITH CONTRAST 09/30/2024 01:27:48 AM TECHNIQUE: CT of the chest, abdomen and pelvis was performed with the administration of 100 mL of iohexol  (OMNIPAQUE ) 300 MG/ML solution. Multiplanar reformatted images are provided for review. Automated exposure control, iterative reconstruction, and/or weight based adjustment of the mA/kV was utilized to reduce the radiation dose to as low as reasonably achievable. COMPARISON: Comparison with the 07/18/2015. CLINICAL HISTORY: Polytrauma, blunt;  rib fractures, flank pain, extensive bruising, on thinners. FINDINGS: CHEST: MEDIASTINUM AND LYMPH NODES: Heart and pericardium are unremarkable. The central airways are clear. No mediastinal, hilar or axillary lymphadenopathy. Moderate hiatal hernia. Ascending aortic aneurysm measuring 45 mm. Aortic atherosclerotic calcification. LUNGS AND PLEURA: Compressive atelectasis of the right lower lobe. No focal consolidation or pulmonary edema. No pleural effusion. No pneumothorax. ABDOMEN AND PELVIS: LIVER: Unremarkable. GALLBLADDER AND BILE DUCTS: Unremarkable. No biliary ductal dilatation. SPLEEN: No acute abnormality. PANCREAS: No acute abnormality. ADRENAL GLANDS: No acute abnormality. KIDNEYS, URETERS AND BLADDER: No stones in the kidneys or ureters. No hydronephrosis. No perinephric or periureteral stranding. Urinary bladder is unremarkable. GI AND BOWEL: Stomach demonstrates no acute abnormality. There is no bowel obstruction. REPRODUCTIVE ORGANS: No acute abnormality. PERITONEUM AND RETROPERITONEUM: No ascites. No free air. VASCULATURE: Aorta is normal in caliber. ABDOMINAL AND PELVIS  LYMPH NODES: No lymphadenopathy. BONES AND SOFT TISSUES: Acute nondisplaced fractures of the right anterior 4th - 10th ribs. Minimally displaced fracture of the right posterior 12th rib. Remote left inferior pubic ramus fracture. Chronic compression fracture of L4. 3.4 x 2.3 cm subcutaneous hematoma posterior to the right iliac crest. IMPRESSION: 1. Acute nondisplaced fractures of the right anterior 4th-10th ribs and minimally displaced fracture of the right posterior 12th rib. 2. 3.4 cm subcutaneous hematoma posterior to the right iliac crest. 3. Ascending aortic aneurysm measuring 45 mm. Recommend semi-annual imaging followup by CTA or MRA and referral to cardiothoracic surgery if not already obtained. This recommendation follows 2010 ACCF/AHA/AATS/ACR/ASA/SCA/SCAI/SIR/STS/SVM Guidelines for the Diagnosis and Management of Patients With Thoracic Aortic Disease. Circulation. 2010; 121: Z733-z630. Aortic aneurysm NOS (ICD10-I71.9). 4. Moderate hiatal hernia. Electronically signed by: Norman Gatlin MD 09/30/2024 01:43 AM EST RP Workstation: [id number]   DG Hip Unilat W or Wo Pelvis 2-3 Views Right Result Date: 09/29/2024 EXAM: 2 or 3 VIEW(S) XRAY OF THE PELVIS AND RIGHT HIP 09/29/2024 09:50:00 PM COMPARISON: None available. CLINICAL HISTORY: Fall. FINDINGS: BONES AND JOINTS: SI joints are symmetric. No acute fracture. Bilateral hips demonstrate normal alignment. SOFT TISSUES: Unremarkable. IMPRESSION: 1. No evidence of acute traumatic injury. Electronically signed by: Norman Gatlin MD 09/29/2024 09:57 PM EST RP Workstation: [id number]     Procedures   Medications Ordered in the ED - No data to display                                  Medical Decision Making Amount and/or Complexity of Data Reviewed Labs: ordered. Radiology: ordered and independent interpretation performed. ECG/medicine tests: ordered and independent interpretation performed.  Risk Prescription drug management.   69 y.o. F  here with right rib and hip pain from a fall about 6 days ago.  Seen at urgent care and told she may have some rib fractures.  Had negative x-rays of the hip at that time.  She reports worsening bruising and pain in her hip since that time.  Afebrile and nontoxic.  She does have extensive bruising to the right flank extending to right lateral hip.  I do not appreciate any bony deformities.  Her lungs are clear without any wheezes or rhonchi.  She is currently on Pradaxa .  Adamantly denies any head injury or loss of consciousness.  Will obtain CTs for further evaluation, labs sent.  Fentanyl  given for pain.  Labs reassuring today.  Hemoglobin 11.7, similar values previously.  May have had a transient drop.  CT chest with right rib fractures #4-10  and posteriorly on #12.  Also has a hematoma to right iliac crest. Lumbar spine without acute findings.  Patient remains hemodynamically stable here.  She is not requiring any supplemental oxygen.  She is very eager to go home.  She is already a week out from her fall and has been managing okay up until this point.  As she is not in any acute distress, feel this is reasonable as very limited intervention this far out from her fall/injuries.  Will start her on pain medication and daily incentive spirometry.  I have encouraged her to follow-up closely with her primary care doctor.  She can return here for any new or acute changes.  Final diagnoses:  Closed fracture of multiple ribs of right side, initial encounter  Right hip pain    ED Discharge Orders          Ordered    HYDROcodone -acetaminophen  (NORCO/VICODIN) 5-325 MG tablet  Every 4 hours PRN        09/30/24 0222               Jarold Olam HERO, PA-C 09/30/24 0231    Griselda Norris, MD 09/30/24 [phone]  "

## 2024-10-06 ENCOUNTER — Ambulatory Visit: Admitting: Cardiovascular Disease

## 2024-11-10 ENCOUNTER — Ambulatory Visit (HOSPITAL_COMMUNITY)

## 2024-11-16 ENCOUNTER — Ambulatory Visit: Admitting: Cardiovascular Disease
